# Patient Record
Sex: Male | Born: 1935 | Race: White | Hispanic: No | Marital: Married | Smoking: Former smoker
Health system: Southern US, Community
[De-identification: ages and names within clinical notes are randomized; demographics above are authoritative.]

## PROBLEM LIST (undated history)

## (undated) DIAGNOSIS — M1990 Unspecified osteoarthritis, unspecified site: Secondary | ICD-10-CM

## (undated) DIAGNOSIS — Z87442 Personal history of urinary calculi: Secondary | ICD-10-CM

## (undated) DIAGNOSIS — I10 Essential (primary) hypertension: Secondary | ICD-10-CM

## (undated) DIAGNOSIS — N400 Enlarged prostate without lower urinary tract symptoms: Secondary | ICD-10-CM

## (undated) DIAGNOSIS — M545 Low back pain, unspecified: Secondary | ICD-10-CM

## (undated) DIAGNOSIS — K5790 Diverticulosis of intestine, part unspecified, without perforation or abscess without bleeding: Secondary | ICD-10-CM

## (undated) DIAGNOSIS — I491 Atrial premature depolarization: Secondary | ICD-10-CM

## (undated) DIAGNOSIS — J449 Chronic obstructive pulmonary disease, unspecified: Secondary | ICD-10-CM

## (undated) DIAGNOSIS — E7800 Pure hypercholesterolemia, unspecified: Secondary | ICD-10-CM

## (undated) DIAGNOSIS — D510 Vitamin B12 deficiency anemia due to intrinsic factor deficiency: Secondary | ICD-10-CM

## (undated) DIAGNOSIS — M47812 Spondylosis without myelopathy or radiculopathy, cervical region: Secondary | ICD-10-CM

## (undated) DIAGNOSIS — Z77090 Contact with and (suspected) exposure to asbestos: Secondary | ICD-10-CM

## (undated) DIAGNOSIS — E291 Testicular hypofunction: Secondary | ICD-10-CM

## (undated) DIAGNOSIS — C61 Malignant neoplasm of prostate: Secondary | ICD-10-CM

## (undated) HISTORY — DX: Benign prostatic hyperplasia without lower urinary tract symptoms: N40.0

## (undated) HISTORY — DX: Chronic obstructive pulmonary disease, unspecified: J44.9

## (undated) HISTORY — PX: OTHER SURGICAL HISTORY: SHX169

## (undated) HISTORY — DX: Pure hypercholesterolemia, unspecified: E78.00

## (undated) HISTORY — DX: Essential (primary) hypertension: I10

## (undated) HISTORY — DX: Atrial premature depolarization: I49.1

## (undated) HISTORY — DX: Unspecified osteoarthritis, unspecified site: M19.90

## (undated) HISTORY — DX: Malignant neoplasm of prostate: C61

## (undated) HISTORY — DX: Spondylosis without myelopathy or radiculopathy, cervical region: M47.812

## (undated) HISTORY — DX: Low back pain: M54.5

## (undated) HISTORY — PX: PROSTATE SURGERY: SHX751

## (undated) HISTORY — DX: Low back pain, unspecified: M54.50

## (undated) HISTORY — DX: Testicular hypofunction: E29.1

## (undated) HISTORY — PX: APPENDECTOMY: SHX54

## (undated) HISTORY — DX: Diverticulosis of intestine, part unspecified, without perforation or abscess without bleeding: K57.90

## (undated) HISTORY — PX: LUNG SURGERY: SHX703

## (undated) HISTORY — DX: Vitamin B12 deficiency anemia due to intrinsic factor deficiency: D51.0

## (undated) HISTORY — DX: Contact with and (suspected) exposure to asbestos: Z77.090

---

## 1995-11-29 HISTORY — PX: OTHER SURGICAL HISTORY: SHX169

## 1999-11-29 HISTORY — PX: OTHER SURGICAL HISTORY: SHX169

## 2000-04-13 ENCOUNTER — Ambulatory Visit (HOSPITAL_COMMUNITY): Admission: RE | Admit: 2000-04-13 | Discharge: 2000-04-13 | Payer: Self-pay | Admitting: Pulmonary Disease

## 2000-04-13 ENCOUNTER — Encounter: Payer: Self-pay | Admitting: Pulmonary Disease

## 2000-04-19 ENCOUNTER — Inpatient Hospital Stay (HOSPITAL_COMMUNITY): Admission: RE | Admit: 2000-04-19 | Discharge: 2000-04-20 | Payer: Self-pay | Admitting: Neurosurgery

## 2000-04-19 ENCOUNTER — Encounter: Payer: Self-pay | Admitting: Neurosurgery

## 2002-06-03 ENCOUNTER — Encounter: Payer: Self-pay | Admitting: Orthopedic Surgery

## 2002-06-06 ENCOUNTER — Encounter: Payer: Self-pay | Admitting: Orthopedic Surgery

## 2002-06-06 ENCOUNTER — Observation Stay (HOSPITAL_COMMUNITY): Admission: RE | Admit: 2002-06-06 | Discharge: 2002-06-07 | Payer: Self-pay | Admitting: Orthopedic Surgery

## 2004-04-15 ENCOUNTER — Ambulatory Visit (HOSPITAL_COMMUNITY): Admission: RE | Admit: 2004-04-15 | Discharge: 2004-04-15 | Payer: Self-pay | Admitting: Pulmonary Disease

## 2005-06-06 ENCOUNTER — Ambulatory Visit: Payer: Self-pay | Admitting: Pulmonary Disease

## 2005-06-13 ENCOUNTER — Ambulatory Visit: Payer: Self-pay

## 2005-06-17 ENCOUNTER — Ambulatory Visit: Payer: Self-pay | Admitting: Pulmonary Disease

## 2005-07-08 ENCOUNTER — Ambulatory Visit: Payer: Self-pay | Admitting: Pulmonary Disease

## 2005-07-22 ENCOUNTER — Ambulatory Visit: Payer: Self-pay | Admitting: Pulmonary Disease

## 2005-08-19 ENCOUNTER — Ambulatory Visit: Payer: Self-pay | Admitting: Pulmonary Disease

## 2005-08-26 ENCOUNTER — Encounter: Admission: RE | Admit: 2005-08-26 | Discharge: 2005-08-26 | Payer: Self-pay | Admitting: Pulmonary Disease

## 2005-09-20 ENCOUNTER — Ambulatory Visit: Payer: Self-pay | Admitting: Pulmonary Disease

## 2005-10-19 ENCOUNTER — Ambulatory Visit: Payer: Self-pay | Admitting: Pulmonary Disease

## 2005-11-16 ENCOUNTER — Ambulatory Visit: Payer: Self-pay | Admitting: Pulmonary Disease

## 2005-12-07 ENCOUNTER — Ambulatory Visit: Payer: Self-pay | Admitting: Pulmonary Disease

## 2005-12-07 LAB — CONVERTED CEMR LAB: TSH: 2.27 microintl units/mL

## 2005-12-19 ENCOUNTER — Ambulatory Visit: Payer: Self-pay | Admitting: Pulmonary Disease

## 2006-01-09 ENCOUNTER — Ambulatory Visit: Payer: Self-pay | Admitting: Pulmonary Disease

## 2006-01-09 ENCOUNTER — Ambulatory Visit (HOSPITAL_COMMUNITY): Admission: RE | Admit: 2006-01-09 | Discharge: 2006-01-09 | Payer: Self-pay | Admitting: Pulmonary Disease

## 2006-02-01 ENCOUNTER — Ambulatory Visit: Payer: Self-pay | Admitting: Pulmonary Disease

## 2006-03-01 ENCOUNTER — Ambulatory Visit: Payer: Self-pay | Admitting: Pulmonary Disease

## 2006-03-29 ENCOUNTER — Ambulatory Visit: Payer: Self-pay | Admitting: Pulmonary Disease

## 2006-04-06 ENCOUNTER — Ambulatory Visit: Payer: Self-pay | Admitting: Pulmonary Disease

## 2006-05-01 ENCOUNTER — Ambulatory Visit: Payer: Self-pay | Admitting: Pulmonary Disease

## 2006-05-30 ENCOUNTER — Ambulatory Visit: Payer: Self-pay | Admitting: Pulmonary Disease

## 2006-06-27 ENCOUNTER — Ambulatory Visit: Payer: Self-pay | Admitting: Pulmonary Disease

## 2006-07-25 ENCOUNTER — Ambulatory Visit: Payer: Self-pay | Admitting: Pulmonary Disease

## 2006-08-08 ENCOUNTER — Ambulatory Visit: Payer: Self-pay | Admitting: Pulmonary Disease

## 2006-08-29 ENCOUNTER — Ambulatory Visit: Payer: Self-pay | Admitting: Pulmonary Disease

## 2006-09-28 ENCOUNTER — Ambulatory Visit: Payer: Self-pay | Admitting: Pulmonary Disease

## 2006-10-30 ENCOUNTER — Ambulatory Visit: Payer: Self-pay | Admitting: Pulmonary Disease

## 2006-11-29 ENCOUNTER — Ambulatory Visit: Payer: Self-pay | Admitting: Pulmonary Disease

## 2006-12-29 ENCOUNTER — Ambulatory Visit: Payer: Self-pay | Admitting: Pulmonary Disease

## 2007-01-30 ENCOUNTER — Ambulatory Visit: Payer: Self-pay | Admitting: Pulmonary Disease

## 2007-02-27 ENCOUNTER — Ambulatory Visit: Payer: Self-pay | Admitting: Pulmonary Disease

## 2007-02-27 LAB — CONVERTED CEMR LAB
Alkaline Phosphatase: 78 units/L (ref 39–117)
BUN: 14 mg/dL (ref 6–23)
Basophils Absolute: 0.1 10*3/uL (ref 0.0–0.1)
Basophils Relative: 0.9 % (ref 0.0–1.0)
Bilirubin, Direct: 0.3 mg/dL (ref 0.0–0.3)
CO2: 26 meq/L (ref 19–32)
Calcium: 10 mg/dL (ref 8.4–10.5)
Chloride: 104 meq/L (ref 96–112)
Creatinine, Ser: 0.9 mg/dL (ref 0.4–1.5)
Eosinophils Relative: 3.7 % (ref 0.0–5.0)
Glucose, Bld: 105 mg/dL — ABNORMAL HIGH (ref 70–99)
HCT: 51.7 % (ref 39.0–52.0)
MCHC: 34.1 g/dL (ref 30.0–36.0)
MCV: 85.9 fL (ref 78.0–100.0)
PSA: 2.91 ng/mL
Potassium: 5 meq/L (ref 3.5–5.1)
RDW: 12.4 % (ref 11.5–14.6)
WBC: 10.1 10*3/uL (ref 4.5–10.5)

## 2007-03-29 ENCOUNTER — Ambulatory Visit: Payer: Self-pay | Admitting: Pulmonary Disease

## 2007-04-27 ENCOUNTER — Ambulatory Visit: Payer: Self-pay | Admitting: Pulmonary Disease

## 2007-05-28 ENCOUNTER — Ambulatory Visit: Payer: Self-pay | Admitting: Pulmonary Disease

## 2007-06-27 ENCOUNTER — Ambulatory Visit: Payer: Self-pay | Admitting: Pulmonary Disease

## 2007-07-27 ENCOUNTER — Ambulatory Visit: Payer: Self-pay | Admitting: Pulmonary Disease

## 2007-08-28 ENCOUNTER — Ambulatory Visit: Payer: Self-pay | Admitting: Pulmonary Disease

## 2007-09-13 ENCOUNTER — Encounter: Payer: Self-pay | Admitting: Pulmonary Disease

## 2007-09-13 DIAGNOSIS — D510 Vitamin B12 deficiency anemia due to intrinsic factor deficiency: Secondary | ICD-10-CM

## 2007-09-13 DIAGNOSIS — Z87898 Personal history of other specified conditions: Secondary | ICD-10-CM | POA: Insufficient documentation

## 2007-09-13 DIAGNOSIS — J449 Chronic obstructive pulmonary disease, unspecified: Secondary | ICD-10-CM

## 2007-09-13 DIAGNOSIS — J4489 Other specified chronic obstructive pulmonary disease: Secondary | ICD-10-CM | POA: Insufficient documentation

## 2007-09-13 DIAGNOSIS — M545 Low back pain, unspecified: Secondary | ICD-10-CM | POA: Insufficient documentation

## 2007-09-13 DIAGNOSIS — I491 Atrial premature depolarization: Secondary | ICD-10-CM

## 2007-09-13 DIAGNOSIS — Z77090 Contact with and (suspected) exposure to asbestos: Secondary | ICD-10-CM

## 2007-09-13 DIAGNOSIS — K5730 Diverticulosis of large intestine without perforation or abscess without bleeding: Secondary | ICD-10-CM

## 2007-09-13 DIAGNOSIS — I10 Essential (primary) hypertension: Secondary | ICD-10-CM | POA: Insufficient documentation

## 2007-09-13 DIAGNOSIS — M1990 Unspecified osteoarthritis, unspecified site: Secondary | ICD-10-CM | POA: Insufficient documentation

## 2007-09-27 ENCOUNTER — Ambulatory Visit: Payer: Self-pay | Admitting: Pulmonary Disease

## 2007-10-29 ENCOUNTER — Ambulatory Visit: Payer: Self-pay | Admitting: Pulmonary Disease

## 2007-11-28 ENCOUNTER — Ambulatory Visit: Payer: Self-pay | Admitting: Pulmonary Disease

## 2007-12-28 ENCOUNTER — Ambulatory Visit: Payer: Self-pay | Admitting: Pulmonary Disease

## 2008-01-29 ENCOUNTER — Ambulatory Visit: Payer: Self-pay | Admitting: Pulmonary Disease

## 2008-02-29 ENCOUNTER — Ambulatory Visit: Payer: Self-pay | Admitting: Pulmonary Disease

## 2008-03-28 ENCOUNTER — Ambulatory Visit: Payer: Self-pay | Admitting: Pulmonary Disease

## 2008-04-02 ENCOUNTER — Ambulatory Visit: Payer: Self-pay | Admitting: Pulmonary Disease

## 2008-04-21 DIAGNOSIS — E785 Hyperlipidemia, unspecified: Secondary | ICD-10-CM | POA: Insufficient documentation

## 2008-04-21 DIAGNOSIS — M47812 Spondylosis without myelopathy or radiculopathy, cervical region: Secondary | ICD-10-CM

## 2008-04-28 ENCOUNTER — Ambulatory Visit: Payer: Self-pay | Admitting: Pulmonary Disease

## 2008-05-28 ENCOUNTER — Ambulatory Visit: Payer: Self-pay | Admitting: Pulmonary Disease

## 2008-07-02 ENCOUNTER — Ambulatory Visit: Payer: Self-pay | Admitting: Pulmonary Disease

## 2008-07-31 ENCOUNTER — Ambulatory Visit: Payer: Self-pay | Admitting: Pulmonary Disease

## 2008-08-28 ENCOUNTER — Ambulatory Visit: Payer: Self-pay | Admitting: Pulmonary Disease

## 2008-09-29 ENCOUNTER — Ambulatory Visit: Payer: Self-pay | Admitting: Pulmonary Disease

## 2008-10-29 ENCOUNTER — Ambulatory Visit: Payer: Self-pay | Admitting: Pulmonary Disease

## 2008-12-01 ENCOUNTER — Ambulatory Visit: Payer: Self-pay | Admitting: Pulmonary Disease

## 2009-01-01 ENCOUNTER — Ambulatory Visit: Payer: Self-pay | Admitting: Pulmonary Disease

## 2009-01-29 ENCOUNTER — Ambulatory Visit: Payer: Self-pay | Admitting: Pulmonary Disease

## 2009-03-02 ENCOUNTER — Ambulatory Visit: Payer: Self-pay | Admitting: Pulmonary Disease

## 2009-04-01 ENCOUNTER — Ambulatory Visit: Payer: Self-pay | Admitting: Pulmonary Disease

## 2009-04-30 ENCOUNTER — Ambulatory Visit: Payer: Self-pay | Admitting: Pulmonary Disease

## 2009-04-30 DIAGNOSIS — E291 Testicular hypofunction: Secondary | ICD-10-CM | POA: Insufficient documentation

## 2009-05-04 LAB — CONVERTED CEMR LAB
ALT: 15 units/L (ref 0–53)
AST: 20 units/L (ref 0–37)
Alkaline Phosphatase: 61 units/L (ref 39–117)
BUN: 20 mg/dL (ref 6–23)
Basophils Absolute: 0 10*3/uL (ref 0.0–0.1)
Basophils Relative: 0.2 % (ref 0.0–3.0)
Bilirubin, Direct: 0.3 mg/dL (ref 0.0–0.3)
Chloride: 111 meq/L (ref 96–112)
Eosinophils Absolute: 0.1 10*3/uL (ref 0.0–0.7)
HCT: 48.3 % (ref 39.0–52.0)
Lymphs Abs: 2 10*3/uL (ref 0.7–4.0)
MCHC: 34.5 g/dL (ref 30.0–36.0)
MCV: 86.9 fL (ref 78.0–100.0)
Monocytes Absolute: 0.6 10*3/uL (ref 0.1–1.0)
Monocytes Relative: 6.8 % (ref 3.0–12.0)
Neutrophils Relative %: 67 % (ref 43.0–77.0)
PSA: 4.31 ng/mL — ABNORMAL HIGH (ref 0.10–4.00)
Potassium: 5 meq/L (ref 3.5–5.1)
RBC: 5.56 M/uL (ref 4.22–5.81)
RDW: 13 % (ref 11.5–14.6)
Sodium: 143 meq/L (ref 135–145)
Testosterone: 255.39 ng/dL — ABNORMAL LOW (ref 350.00–890.00)
Total Bilirubin: 1.7 mg/dL — ABNORMAL HIGH (ref 0.3–1.2)
Total CHOL/HDL Ratio: 5
VLDL: 16.4 mg/dL (ref 0.0–40.0)
WBC: 8.2 10*3/uL (ref 4.5–10.5)

## 2009-05-26 ENCOUNTER — Encounter: Payer: Self-pay | Admitting: Pulmonary Disease

## 2009-05-29 ENCOUNTER — Ambulatory Visit: Payer: Self-pay | Admitting: Pulmonary Disease

## 2009-06-15 LAB — CONVERTED CEMR LAB: Vit D, 25-Hydroxy: 33 ng/mL (ref 30–89)

## 2009-06-29 ENCOUNTER — Ambulatory Visit: Payer: Self-pay | Admitting: Pulmonary Disease

## 2009-07-30 ENCOUNTER — Ambulatory Visit: Payer: Self-pay | Admitting: Pulmonary Disease

## 2009-08-28 ENCOUNTER — Ambulatory Visit: Payer: Self-pay | Admitting: Pulmonary Disease

## 2009-09-28 ENCOUNTER — Ambulatory Visit: Payer: Self-pay | Admitting: Pulmonary Disease

## 2009-10-28 ENCOUNTER — Ambulatory Visit: Payer: Self-pay | Admitting: Pulmonary Disease

## 2009-11-26 ENCOUNTER — Encounter: Payer: Self-pay | Admitting: Pulmonary Disease

## 2009-11-30 ENCOUNTER — Ambulatory Visit: Payer: Self-pay | Admitting: Pulmonary Disease

## 2009-12-22 ENCOUNTER — Encounter: Payer: Self-pay | Admitting: Pulmonary Disease

## 2009-12-30 ENCOUNTER — Ambulatory Visit: Payer: Self-pay | Admitting: Pulmonary Disease

## 2010-01-04 ENCOUNTER — Ambulatory Visit (HOSPITAL_COMMUNITY): Admission: RE | Admit: 2010-01-04 | Discharge: 2010-01-04 | Payer: Self-pay | Admitting: Urology

## 2010-01-21 ENCOUNTER — Encounter: Payer: Self-pay | Admitting: Pulmonary Disease

## 2010-01-22 ENCOUNTER — Ambulatory Visit: Admission: RE | Admit: 2010-01-22 | Discharge: 2010-04-09 | Payer: Self-pay | Admitting: Radiation Oncology

## 2010-01-29 ENCOUNTER — Ambulatory Visit: Payer: Self-pay | Admitting: Pulmonary Disease

## 2010-03-01 ENCOUNTER — Ambulatory Visit: Payer: Self-pay | Admitting: Pulmonary Disease

## 2010-03-09 ENCOUNTER — Encounter: Payer: Self-pay | Admitting: Pulmonary Disease

## 2010-03-18 ENCOUNTER — Encounter: Payer: Self-pay | Admitting: Pulmonary Disease

## 2010-03-31 ENCOUNTER — Ambulatory Visit: Payer: Self-pay | Admitting: Pulmonary Disease

## 2010-04-28 HISTORY — PX: OTHER SURGICAL HISTORY: SHX169

## 2010-04-30 ENCOUNTER — Ambulatory Visit: Payer: Self-pay | Admitting: Pulmonary Disease

## 2010-05-12 ENCOUNTER — Encounter: Admission: RE | Admit: 2010-05-12 | Discharge: 2010-05-12 | Payer: Self-pay | Admitting: Urology

## 2010-05-21 ENCOUNTER — Ambulatory Visit (HOSPITAL_BASED_OUTPATIENT_CLINIC_OR_DEPARTMENT_OTHER): Admission: RE | Admit: 2010-05-21 | Discharge: 2010-05-21 | Payer: Self-pay | Admitting: Urology

## 2010-05-21 ENCOUNTER — Encounter: Payer: Self-pay | Admitting: Pulmonary Disease

## 2010-05-24 ENCOUNTER — Ambulatory Visit: Payer: Self-pay | Admitting: Pulmonary Disease

## 2010-06-01 ENCOUNTER — Ambulatory Visit: Payer: Self-pay | Admitting: Pulmonary Disease

## 2010-06-10 ENCOUNTER — Ambulatory Visit: Admission: RE | Admit: 2010-06-10 | Discharge: 2010-08-15 | Payer: Self-pay | Admitting: Radiation Oncology

## 2010-06-29 ENCOUNTER — Ambulatory Visit: Payer: Self-pay | Admitting: Pulmonary Disease

## 2010-07-30 ENCOUNTER — Ambulatory Visit: Payer: Self-pay | Admitting: Pulmonary Disease

## 2010-08-10 ENCOUNTER — Encounter: Payer: Self-pay | Admitting: Pulmonary Disease

## 2010-08-26 ENCOUNTER — Encounter: Payer: Self-pay | Admitting: Pulmonary Disease

## 2010-08-27 ENCOUNTER — Ambulatory Visit: Payer: Self-pay | Admitting: Pulmonary Disease

## 2010-09-24 ENCOUNTER — Ambulatory Visit: Payer: Self-pay | Admitting: Pulmonary Disease

## 2010-10-26 ENCOUNTER — Ambulatory Visit: Payer: Self-pay | Admitting: Pulmonary Disease

## 2010-11-26 ENCOUNTER — Ambulatory Visit: Payer: Self-pay | Admitting: Pulmonary Disease

## 2010-12-06 ENCOUNTER — Other Ambulatory Visit: Payer: Self-pay | Admitting: Pulmonary Disease

## 2010-12-06 ENCOUNTER — Ambulatory Visit
Admission: RE | Admit: 2010-12-06 | Discharge: 2010-12-06 | Payer: Self-pay | Source: Home / Self Care | Attending: Pulmonary Disease | Admitting: Pulmonary Disease

## 2010-12-06 LAB — LIPID PANEL
Cholesterol: 159 mg/dL (ref 0–200)
HDL: 32.5 mg/dL — ABNORMAL LOW (ref >39.00)
LDL Cholesterol: 109 mg/dL — ABNORMAL HIGH (ref 0–99)
Total CHOL/HDL Ratio: 5
Triglycerides: 86 mg/dL (ref 0.0–149.0)
VLDL: 17.2 mg/dL (ref 0.0–40.0)

## 2010-12-06 LAB — CBC WITH DIFFERENTIAL/PLATELET
Basophils Absolute: 0 10*3/uL (ref 0.0–0.1)
Basophils Relative: 0.4 % (ref 0.0–3.0)
Eosinophils Absolute: 0.3 10*3/uL (ref 0.0–0.7)
Eosinophils Relative: 4.1 % (ref 0.0–5.0)
HCT: 45.8 % (ref 39.0–52.0)
Hemoglobin: 15.9 g/dL (ref 13.0–17.0)
Lymphocytes Relative: 21.1 % (ref 12.0–46.0)
Lymphs Abs: 1.5 10*3/uL (ref 0.7–4.0)
MCHC: 34.7 g/dL (ref 30.0–36.0)
MCV: 86.5 fl (ref 78.0–100.0)
Monocytes Absolute: 0.6 10*3/uL (ref 0.1–1.0)
Monocytes Relative: 8.7 % (ref 3.0–12.0)
Neutro Abs: 4.5 10*3/uL (ref 1.4–7.7)
Neutrophils Relative %: 65.7 % (ref 43.0–77.0)
Platelets: 148 10*3/uL — ABNORMAL LOW (ref 150.0–400.0)
RBC: 5.3 Mil/uL (ref 4.22–5.81)
RDW: 13.4 % (ref 11.5–14.6)
WBC: 6.9 10*3/uL (ref 4.5–10.5)

## 2010-12-06 LAB — BASIC METABOLIC PANEL
BUN: 17 mg/dL (ref 6–23)
CO2: 27 mEq/L (ref 19–32)
Calcium: 10 mg/dL (ref 8.4–10.5)
Chloride: 109 mEq/L (ref 96–112)
Creatinine, Ser: 0.9 mg/dL (ref 0.4–1.5)
GFR: 82.81 mL/min (ref >60.00)
Glucose, Bld: 95 mg/dL (ref 70–99)
Potassium: 5.6 mEq/L — ABNORMAL HIGH (ref 3.5–5.1)
Sodium: 141 mEq/L (ref 135–145)

## 2010-12-06 LAB — HEPATIC FUNCTION PANEL
ALT: 15 U/L (ref 0–53)
AST: 19 U/L (ref 0–37)
Albumin: 3.8 g/dL (ref 3.5–5.2)
Alkaline Phosphatase: 69 U/L (ref 39–117)
Bilirubin, Direct: 0.3 mg/dL (ref 0.0–0.3)
Total Bilirubin: 1.7 mg/dL — ABNORMAL HIGH (ref 0.3–1.2)
Total Protein: 6.8 g/dL (ref 6.0–8.3)

## 2010-12-06 LAB — VITAMIN B12: Vitamin B-12: 497 pg/mL (ref 211–911)

## 2010-12-06 LAB — TSH: TSH: 2.07 u[IU]/mL (ref 0.35–5.50)

## 2010-12-24 ENCOUNTER — Ambulatory Visit
Admission: RE | Admit: 2010-12-24 | Discharge: 2010-12-24 | Payer: Self-pay | Source: Home / Self Care | Attending: Pulmonary Disease | Admitting: Pulmonary Disease

## 2010-12-26 LAB — CONVERTED CEMR LAB
ALT: 19 units/L (ref 0–53)
AST: 19 units/L (ref 0–37)
Bilirubin, Direct: 0.2 mg/dL (ref 0.0–0.3)
Calcium: 10.1 mg/dL (ref 8.4–10.5)
Chloride: 109 meq/L (ref 96–112)
Cholesterol: 155 mg/dL (ref 0–200)
Creatinine, Ser: 1 mg/dL (ref 0.4–1.5)
Eosinophils Relative: 2 % (ref 0.0–5.0)
Glucose, Bld: 122 mg/dL — ABNORMAL HIGH (ref 70–99)
HDL: 28.4 mg/dL — ABNORMAL LOW (ref >39.0)
Lymphocytes Relative: 24.4 % (ref 12.0–46.0)
MCHC: 34.7 g/dL (ref 30.0–36.0)
Monocytes Relative: 5.8 % (ref 3.0–12.0)
Neutrophils Relative %: 66.8 % (ref 43.0–77.0)
PSA: 3.58 ng/mL (ref 0.10–4.00)
Potassium: 5.1 meq/L (ref 3.5–5.1)
RDW: 13.1 % (ref 11.5–14.6)
Sodium: 142 meq/L (ref 135–145)
Total Protein: 7.3 g/dL (ref 6.0–8.3)
Triglycerides: 73 mg/dL (ref 0–149)
VLDL: 15 mg/dL (ref 0–40)

## 2010-12-30 NOTE — Assessment & Plan Note (Signed)
Summary: Joseph Carter ///kp   Nurse Visit   Allergies: 1)  ! Lisinopril (Lisinopril)  Medication Administration  Injection # 1:    Medication: Vit B12 1000 mcg    Diagnosis: UNSPECIFIED ANEMIA (ICD-285.9)    Route: IM    Site: R deltoid    Exp Date: 08/28/2012    Lot #: [id number]    Mfr: [employer]    Comments: Vitamin b-12 1000 micrograms, in R deltoid. Pt tolerated injection ok.    Given by: Drucie Opitz, CMA (AAMA)  Orders Added: 1)  Vit B12 1000 mcg [J3420] 2)  Admin of Therapeutic Inj  intramuscular or subcutaneous [16109]

## 2010-12-30 NOTE — Letter (Signed)
Summary: [facility]  [facility]   Imported By: Lennie Odor 09/16/2010 15:17:53  _____________________________________________________________________  External Attachment:    Type:   Image     Comment:   External Document

## 2010-12-30 NOTE — Assessment & Plan Note (Signed)
Summary: b-12/apc   Nurse Visit   Allergies: 1)  ! Lisinopril (Lisinopril)  Medication Administration  Injection # 1:    Medication: Vit B12 1000 mcg    Diagnosis: 285.9    Route: SQ    Site: L deltoid    Exp Date: 12/2011    Lot #: [id number]    Mfr: [employer]    Comments: 1000 MCG CHARGED [id number]    Given by: Drucie Opitz IN [facility]ERGY [facility]    Medication Administration  Injection # 1:    Medication: Vit B12 1000 mcg    Diagnosis: 285.9    Route: SQ    Site: L deltoid    Exp Date: 12/2011    Lot #: [id number]    Mfr: [employer]    Comments: 1000 MCG CHARGED [id number] AND [id number]    Given by: Drucie Opitz IN [facility]

## 2010-12-30 NOTE — Assessment & Plan Note (Signed)
Summary: xolair///kp   Nurse Visit   Allergies: 1)  ! Lisinopril (Lisinopril)  Medication Administration  Injection # 1:    Medication: Vit B12 1000 mcg    Diagnosis: UNSPECIFIED ANEMIA (ICD-285.9)    Route: IM    Site: L deltoid    Exp Date: 12/30/2011    Lot #: [id number]    Mfr: [employer]    Comments: vitamin b-12 injection 1000 micrograms in l deltoid pt has no problems and didn't wait    Given by: Clarise Cruz  Orders Added: 1)  Vit B12 1000 mcg [J3420] 2)  Admin of Therapeutic Inj  intramuscular or subcutaneous [16109]

## 2010-12-30 NOTE — Assessment & Plan Note (Signed)
Summary: B-12/MHH   Nurse Visit   Allergies: 1)  ! Lisinopril (Lisinopril)  Medication Administration  Injection # 1:    Medication: Vit B12 1000 mcg    Diagnosis: UNSPECIFIED ANEMIA (ICD-285.9)    Route: IM    Site: L deltoid    Exp Date: 03/2012    Lot #: [id number]    Mfr: [employer]    Comments: CHARGED J3420 AND 45409    Patient tolerated injection without complications    Given by: Drucie Opitz IN [facility]   Medication Administration  Injection # 1:    Medication: Vit B12 1000 mcg    Diagnosis: UNSPECIFIED ANEMIA (ICD-285.9)    Route: IM    Site: L deltoid    Exp Date: 03/2012    Lot #: [id number]    Mfr: [employer]    Comments: CHARGED J3420 AND 82956    Patient tolerated injection without complications    Given by: SUSANNE FORD IN [facility]ERGY [facility]

## 2010-12-30 NOTE — Assessment & Plan Note (Signed)
Summary: NP follow up - med refills   CC:  ov to have diovan refilled.  has been following up with cancer center for prostate cancer.  History of Present Illness: 77 y/o male with known hx of   6/10--WM here for a follow up visit... he has multiple medical problems as noted below...  he reports having a good year- BP controlled on his home checks, getting B12 shots monthly, feeling well w/o new complanits or concerns... he exercises w/ yard work etc & no problems reported.  May 24, 2010--Returns for follow up  and med refills. Doing well , despite all the treatments for prostate cancer.  He was Dx w/ Prostate cancer s/p seed implants and radiation f/by Dr. Earlene Plater over last 6 months . Labs done last week at urololgy w/ nml cbc and scr at 1.06. Denies chest pain, dyspnea, orthopnea, hemoptysis, fever, n/v/d, edema, headache.    Preventive Screening-Counseling & Management  Alcohol-Tobacco     Smoking Status: quit  Medications Prior to Update: 1)  Adult Aspirin Ec Low Strength 81 Mg  Tbec (Aspirin) .[name].. 1 By Mouth Once Daily 2)  Norvasc 5 Mg  Tabs (Amlodipine Besylate) .[name].. 1 By Mouth Once Daily 3)  Diovan 160 Mg  Tabs (Valsartan) .[name].. 1 By Mouth Once Daily 4)  Centrum Silver   Tabs (Multiple Vitamins-Minerals) .... Once Daily 5)  Cobal-1000 1000 Mcg/ml Inj Soln (Cyanocobalamin) .[name].. 1 Injection Every Month  Current Medications (verified): 1)  Adult Aspirin Ec Low Strength 81 Mg  Tbec (Aspirin) .[name].. 1 By Mouth Once Daily 2)  Norvasc 5 Mg  Tabs (Amlodipine Besylate) .[name].. 1 By Mouth Once Daily 3)  Diovan 160 Mg  Tabs (Valsartan) .[name].. 1 By Mouth Once Daily 4)  Centrum Silver   Tabs (Multiple Vitamins-Minerals) .... Once Daily 5)  Cobal-1000 1000 Mcg/ml Inj Soln (Cyanocobalamin) .[name].. 1 Injection Every Month  Allergies (verified): 1)  ! Lisinopril (Lisinopril)  Past History:  Family History: Last updated: 2009-05-18 Father died age 51 w/ metastatic prostate ca Mother died age 63 w/  AAA rupture 2 Siblings: both brothers- one w/ kidney disease  Social History: Last updated: 05/18/2009 Married, wife= Lillian, 54 yrs. 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  Risk Factors: Smoking Status: quit (05/24/2010)  Past Medical History: COPD (ICD-496) - ex-smoker (quit ~1980) w/ hx COPD.. .. s/p pneumovax in 2000 at age 23, and l repeat 05-18-09.  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   HYPERTENSION (ICD-401.9) - on ASA 81mg /d, NORVASC 5mg /d and DIOVAN 160mg /d..   ~  + FamHx CAD- Bro w/ CABG...  ~  2DEcho 9/98 showed mild LVH, norm LVF, mild AI...  Hx of PAC (ICD-427.61) - neg cardiac eval 1998... he takes ASA 81mg /d...  HYPERCHOLESTEROLEMIA, BORDERLINE (ICD-272.4)  ~  FLP 9/07 showed TChol 161, TG 54, HDL 33, LDL 117... on diet + exercise Rx...  ~  FLP 5/09 showed TChol 155, TG 73, HDL 28, LDL 112  ~  FLP 6/10 showed TChol 160, TG 82, HDL 34, LDL 110  DIVERTICULAR DISEASE (ICD-562.10) - colonoscopy 4/03 by Dorris Singh showed divertics only... f/u rec 10 yrs.  BENIGN PROSTATIC HYPERTROPHY, HX OF (ICD-V13.8),  ELEVATED PROSTATE SPECIFIC ANTIGEN (ICD-790.93), & HYPOGONADISM (ICD-257.2)- prev hx of intol to Doxazosin... therefore changed to [facility] 0.4mg /d but states no benefit to his decr stream so he stopped  it.. ** labs 6/10 w/ sl incr PSA & low testosterone **  ~  labs 4/08 showed PSA= 2.91  ~  labs 5/09 showed PSA= 3.58  ~  labs 6/10 showed PSA= 4.31 (note: Testosterone level= 255)... refer to Urology  PROSTATE CANCER S/P SEEDS AND RADIATION--2011, by Dr. Earlene Plater.      Review of Systems      See HPI  Vital Signs:  Patient profile:   77 year old male Height:      76 inches Weight:      222 pounds BMI:     27.12 O2 Sat:      96 % on Room air Temp:     97.5 degrees F oral Pulse rate:   63 / minute BP  sitting:   126 / 74  (left arm) Cuff size:   regular  Vitals Entered By: Boone Master CNA/MA (May 24, 2010 3:33 PM)  O2 Flow:  Room air CC: ov to have diovan refilled.  has been following up with cancer center for prostate cancer Is Patient Diabetic? No Comments Medications reviewed with patient Daytime contact number verified with patient. Boone Master CNA/MA  May 24, 2010 3:33 PM    Physical Exam  Additional Exam:  WD, WN, 77 y/o WM in NAD... GENERAL:  Alert & oriented; pleasant & cooperative... HEENT:  [place]/AT, , EACs-clear, TMs-wnl, NOSE-clear, THROAT-clear & wnl. NECK:  Supple w/ fairROM; no JVD; normal carotid impulses w/o bruits; no thyromegaly or nodules palpated; no lymphadenopathy. CHEST:  Clear to P & A; without wheezes/ rales/ or rhonchi. HEART:  Regular Rhythm; without murmurs/ rubs/ or gallops. ABDOMEN:  Soft & nontender; normal bowel sounds; no organomegaly or masses detected. EXT: without deformities, mild arthritic changes; no varicose veins/ venous insuffic/ or edema. NEURO:  CN's intact;  no focal neuro deficits... DERM:  No lesions noted; no rash etc...     Impression & Recommendations:  Problem # 1:  HYPERTENSION (ICD-401.9)  controlled on rx  His updated medication list for this problem includes:    Norvasc 5 Mg Tabs (Amlodipine besylate) .[name]... 1 by mouth once daily    Diovan 160 Mg Tabs (Valsartan) .[name]... 1 by mouth once daily  BP today: 126/74 Prior BP: 140/78 (04/30/2009)  Orders: Est. Patient Level III (16109)  Problem # 2:  HYPERCHOLESTEROLEMIA, BORDERLINE (ICD-272.4) cont w/ diet and exercise  Labs Reviewed: SGOT: 20 (04/30/2009)   SGPT: 15 (04/30/2009)   HDL:33.70 (04/30/2009), 28.4 (04/02/2008)  LDL:110 (04/30/2009), 112 (04/02/2008)  Chol:160 (04/30/2009), 155 (04/02/2008)  Trig:82.0 (04/30/2009), 73 (04/02/2008)  Problem # 3:  BENIGN PROSTATIC HYPERTROPHY, HX OF (ICD-V13.8)  dx w/ prostate cancer last year s/p seed implants and  radiation.  cont follow up w/ Dr. Earlene Plater   Orders: Est. Patient Level III (60454)  Complete Medication List: 1)  Adult Aspirin Ec Low Strength 81 Mg Tbec (Aspirin) .[name].. 1 by mouth once daily 2)  Norvasc 5 Mg Tabs (Amlodipine besylate) .[name].. 1 by mouth once daily 3)  Diovan 160 Mg Tabs (Valsartan) .[name].. 1 by mouth once daily 4)  Centrum Silver Tabs (Multiple vitamins-minerals) .... Once daily 5)  Cobal-1000 1000 Mcg/ml Inj Soln (Cyanocobalamin) .[name].. 1 injection every month  Patient Instructions: 1)  Continue on same meds  2)  follow up Dr. Kriste Basque in 6 months  Prescriptions: DIOVAN 160 MG  TABS (VALSARTAN) 1 by mouth once daily  #30 Tablet x 5   Entered and Authorized by:   Rubye Oaks NP   Signed by:  Rubye Oaks NP on 05/24/2010   Method used:   Electronically to        [facility] (retail)       [address]       [place], [place]  [address]       Ph: [phone]       Fax: [phone]   RxID:   [id number] NORVASC 5 MG  TABS (AMLODIPINE BESYLATE) 1 by mouth once daily  #30 Tablet x 5   Entered and Authorized by:   Rubye Oaks NP   Signed by:   Rubye Oaks NP on 05/24/2010   Method used:   Electronically to        [facility] (retail)       [address]       [place], [place]  [address]       Ph: [phone]       Fax: [phone]   RxID:   [id number]

## 2010-12-30 NOTE — Assessment & Plan Note (Signed)
Summary: b-12/apc   Nurse Visit   Allergies: 1)  ! Lisinopril (Lisinopril)  Medication Administration  Injection # 1:    Medication: Vit B12 1000 mcg    Diagnosis: UNSPECIFIED ANEMIA (ICD-285.9)    Route: IM    Site: L deltoid    Exp Date: 06/2010    Lot #: [id number]    Mfr: [employer]    Comments: Injection given by Drucie Opitz, CMA in allergy lab.     Patient tolerated injection without complications  Orders Added: 1)  Admin of Therapeutic Inj  intramuscular or subcutaneous [96372] 2)  Vit B12 1000 mcg [J3420]

## 2010-12-30 NOTE — Consult Note (Signed)
Summary: [facility]  [facility]   Imported By: Lennie Odor 03/23/2010 11:55:25  _____________________________________________________________________  External Attachment:    Type:   Image     Comment:   External Document

## 2010-12-30 NOTE — Assessment & Plan Note (Signed)
Summary: 6 month return/mhh   CC:  19 month ROV & review of mult medical problems....  History of Present Illness: 77 y/o WM here for a follow up visit... he has multiple medical problems as noted below...     ~  Jun10:  he reports having a good year- BP controlled on his home checks, getting B12 shots monthly, feeling well w/o new complanits or concerns... he exercises w/ yard work etc & no problems reported.   ~  December 06, 2010:  after Osric's last visit his PSA ret at 4.31 & he was referred to Urology & saw [facility] w/ the eventual Dx of Prostate Cancer (PSA up to 7.18 at that time) & Rx w/ brachytherapy & 3D radiation planning from DrManning... he last saw DrDavis for f/u 9/11 & he plans Q84mo evaluations- on FLOMAX 0.4mg /d & pt wants to stop this (asked to check w/ Urology)...    He has hx Asbestos exposure & prev VATS surg for cyst 1997> CXR is unchanged... BP controlled on meds... Chol looks good on diet alone...  his CC is his arthritis & he uses OTC meds- offer Rx but he doesn't want more meds if poss...  he remains on B12 shots w/ norm B12 level- notes energy "fair" & advised oral MVI etc... he refuses Flu vaccine.    Current Problem List:  COPD (ICD-496) - ex-smoker (quit ~1980) w/ hx COPD... no active symptoms... doing satis, no meds, no recent exac- denies SOB, CP, edema, etc- min AM cough/ no phlegm/ no blood etc...  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  ~  CXR 1/12 showed s/p surg on right, chr changes, NAD...  HYPERTENSION (ICD-401.9) - on ASA 81mg /d, NORVASC 5mg /d and DIOVAN 160mg /d... BP= 144/82 today, but all 130's/ 80's at home... denies HA, fatigue, visual changes, CP, palipit, dizziness, syncope, dyspnea, edema, etc...  ~  + FamHx CAD- Bro w/ CABG...  ~  2DEcho 9/98 showed mild LVH, norm LVF, mild AI...  Hx of PAC (ICD-427.61) - neg cardiac eval 1998... he takes  ASA 81mg /d...  HYPERCHOLESTEROLEMIA, BORDERLINE (ICD-272.4)  ~  FLP 9/07 showed TChol 161, TG 54, HDL 33, LDL 117... on diet + exercise Rx...  ~  FLP 5/09 showed TChol 155, TG 73, HDL 28, LDL 112  ~  FLP 6/10 showed TChol 160, TG 82, HDL 34, LDL 110  ~  FLP 1/12 showed TChol 159, TG 86, HDL 33, LDL 109  DIVERTICULAR DISEASE (ICD-562.10) - colonoscopy 4/03 by Dorris Singh showed divertics only... f/u rec 10 yrs.  BENIGN PROSTATIC HYPERTROPHY, HX OF (ICD-V13.8) ADENOCARCINOMA, PROSTATE (ICD-185) HYPOGONADISM (ICD-257.2) - prev hx of intol to Doxazosin... therefore changed to [facility] 0.4mg /d but states no benefit to his decr stream so he stopped it... only mild LTOS & denies incontinence, freq, nocturia...  ** labs 6/10 w/ sl incr PSA & low testosterone **  ~  labs 4/08 showed PSA= 2.91  ~  labs 5/09 showed PSA= 3.58  ~  labs 6/10 showed PSA= 4.31 (note: Testosterone level= 255)... refer to Urology, DrDavis ==>   & eventual Dx Prostate Cancer & he decided on Brachytherapy per DrManning  (done 6/11)...  ~  pt tells me he continues to follow up w/ DrDavis Q47mo...  DEGENERATIVE JOINT DISEASE (ICD-715.90)  BACK PAIN, LUMBAR (ICD-724.2) - s/p L4-5 laminectomy and microdiscectomy 5/01 by [name]...  CERVICAL SPONDYLOSIS WITHOUT MYELOPATHY (ICD-721.0) -  he also has known CSpine spondylosis and foramenal stenosis- followed by [name]...  ANEMIA, PERNICIOUS (ICD-281.0) - on B 12 shots monthly...  ~  labs 5/09 showed B12= 602  ~  labs 6/10 showed B12= >1500  ~  labs 1/12 showed Hg= 15.9, B12= 497   Preventive Screening-Counseling & Management  Alcohol-Tobacco     Smoking Status: quit     Year Quit: 1971  Allergies: 1)  ! Lisinopril (Lisinopril)  Comments:  Nurse/Medical Assistant: The patient's medications and allergies were reviewed with the patient and were updated in the Medication and Allergy Lists.  Past History:  Past Medical History: COPD (ICD-496) HX, PERSONAL, EXPOSURE TO  ASBESTOS (ICD-V15.84) HYPERTENSION (ICD-401.9) Hx of PAC (ICD-427.61) HYPERCHOLESTEROLEMIA, BORDERLINE (ICD-272.4) DIVERTICULAR DISEASE (ICD-562.10) BENIGN PROSTATIC HYPERTROPHY, HX OF (ICD-V13.8) ADENOCARCINOMA, PROSTATE (ICD-185) HYPOGONADISM (ICD-257.2) DEGENERATIVE JOINT DISEASE (ICD-715.90) BACK PAIN, LUMBAR (ICD-724.2) CERVICAL SPONDYLOSIS WITHOUT MYELOPATHY (ICD-721.0) ANEMIA, PERNICIOUS (ICD-281.0)  Past Surgical History: S/P appendectomy S/P surgery for removal of intercostal cyst in 1997 S/P lumbar laminectomy L4-5 in 2001by [name] S/P CTS surg on right by DrGramig S/P prostate bx, then brachytherapy 6/11 by [email or web address]  Family History: Reviewed history from 04/30/2009 and no changes required. Father died age 53 w/ metastatic prostate ca Mother died age 50 w/ AAA rupture 2 Siblings: both brothers- one w/ kidney disease  Social History: Reviewed history from 04/30/2009 and no changes required. Married, wife= Parachute, 54 yrs. 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  Review of Systems       The patient complains of malaise, dyspnea on exertion, urinary frequency, nocturia, joint pain, stiffness, and arthritis.  The patient denies fever, chills, sweats, anorexia, fatigue, weakness, weight loss, sleep disorder, blurring, diplopia, eye irritation, eye discharge, vision loss, eye pain, photophobia, earache, ear discharge, tinnitus, decreased hearing, nasal congestion, nosebleeds, sore throat, hoarseness, chest pain, palpitations, syncope, orthopnea, PND, peripheral edema, cough, dyspnea at rest, excessive sputum, hemoptysis, wheezing, pleurisy, nausea, vomiting, diarrhea, constipation, change in bowel habits, abdominal pain, melena, hematochezia, jaundice, gas/bloating, indigestion/heartburn, dysphagia, odynophagia, dysuria, hematuria, urinary hesitancy, incontinence, back pain, joint swelling, muscle cramps, muscle  weakness, restless legs, leg pain at night, leg pain with exertion, rash, itching, dryness, suspicious lesions, paralysis, paresthesias, seizures, tremors, vertigo, transient blindness, frequent falls, frequent headaches, difficulty walking, depression, anxiety, memory loss, confusion, cold intolerance, heat intolerance, polydipsia, polyphagia, polyuria, unusual weight change, abnormal bruising, bleeding, enlarged lymph nodes, urticaria, allergic rash, hay fever, and recurrent infections.    Vital Signs:  Patient profile:   77 year old male Height:      76 inches Weight:      226.50 pounds BMI:     27.67 O2 Sat:      97 % on Room air Temp:     97.1 degrees F oral Pulse rate:   66 / minute BP sitting:   146 / 82  (right arm) Cuff size:   regular  Vitals Entered By: Randell Loop CMA (December 06, 2010 10:24 AM)  O2 Sat at Rest %:  97 O2 Flow:  Room air CC: 19 month ROV & review of mult medical problems... Is Patient Diabetic? No Pain Assessment Patient in pain? yes      Onset of pain  arthritis pain Comments meds updated today with pt   Physical Exam  Additional Exam:  WD, WN, 77 y/o WM in NAD... GENERAL:  Alert & oriented; pleasant & cooperative... HEENT:  [place]/AT, , EACs-clear, TMs-wnl, NOSE-clear, THROAT-clear &  wnl. NECK:  Supple w/ fairROM; no JVD; normal carotid impulses w/o bruits; no thyromegaly or nodules palpated; no lymphadenopathy. CHEST:  Clear to P & A; without wheezes/ rales/ or rhonchi heard... HEART:  Regular Rhythm; without murmurs/ rubs/ or gallops detected... ABDOMEN:  Soft & nontender; normal bowel sounds; no organomegaly or masses palpated... EXT: without deformities, mild arthritic changes; no varicose veins/ venous insuffic/ or edema. NEURO:  CN's intact;  no focal neuro deficits... DERM:  No lesions noted; no rash etc...    CXR  Procedure date:  12/06/2010  Findings:      CHEST - 2 VIEW Comparison: 05/12/2010   Findings: Prior thoracotomy/rib  resection on the right with pleural thickening which is unchanged.  No significant pleural fluid is present.  No mass lesion or infiltrate.  Left lung is clear.   Hyperinflation with chronic lung disease.   IMPRESSION: COPD with postsurgical changes on the right.  No change from prior studies.   Read By:  Camelia Phenes,  M.D.   MISC. Report  Procedure date:  12/06/2010  Findings:      BMP (METABOL)   Sodium                    141 mEq/L                   135-145   Potassium            [H]  5.6 mEq/L                   3.5-5.1   Chloride                  109 mEq/L                   96-112   Carbon Dioxide            27 mEq/L                    19-32   Glucose                   95 mg/dL                    16-10   BUN                       17 mg/dL                    9-60   Creatinine                0.9 mg/dL                   4.5-4.0   Calcium                   10.0 mg/dL                  9.8-11.9   GFR                       82.81 mL/min                >60.00  Hepatic/Liver Function Panel (HEPATIC)   Total Bilirubin      [H]  1.7 mg/dL  0.3-1.2   Direct Bilirubin          0.3 mg/dL                   1.6-1.0   Alkaline Phosphatase      69 U/L                      39-117   AST                       19 U/L                      0-37   ALT                       15 U/L                      0-53   Total Protein             6.8 g/dL                    9.6-0.4   Albumin                   3.8 g/dL                    5.4-0.9  CBC Platelet w/Diff (CBCD)   White Cell Count          6.9 K/uL                    4.5-10.5   Red Cell Count            5.30 Mil/uL                 4.22-5.81   Hemoglobin                15.9 g/dL                   81.1-91.4   Hematocrit                45.8 %                      39.0-52.0   MCV                       86.5 fl                     78.0-100.0   Platelet Count       [L]  148.0 K/uL                  150.0-400.0   Neutrophil %               65.7 %                      43.0-77.0   Lymphocyte %              21.1 %                      12.0-46.0   Monocyte %                8.7 %  3.0-12.0   Eosinophils%              4.1 %                       0.0-5.0   Basophils %               0.4 %                       0.0-3.0  Comments:      Lipid Panel (LIPID)   Cholesterol               159 mg/dL                   1-610   Triglycerides             86.0 mg/dL                  9.6-045.4   HDL                  [L]  09.81 mg/dL                 >19.14   LDL Cholesterol      [H]  782 mg/dL                   9-56  TSH (TSH)   FastTSH                   2.07 uIU/mL                 0.35-5.50  B12 Serum - Total ONLY (B12)   Vitamin B12               497 pg/mL                   211-911   Impression & Recommendations:  Problem # 1:  ADENOCARCINOMA, PROSTATE (ICD-185) Followed by DrDavis & DrManning...  Problem # 2:  COPD (ICD-496) Stable>  CXR is unchanged... Hx surg for intersostal cyst, and hx asbestos exposure in past... Orders: T-2 View CXR (71020TC)  Problem # 3:  HYPERTENSION (ICD-401.9) Controlled>  same meds. His updated medication list for this problem includes:    Norvasc 5 Mg Tabs (Amlodipine besylate) .[name]... 1 by mouth once daily    Diovan 160 Mg Tabs (Valsartan) .[name]... 1 by mouth once daily  Orders: TLB-BMP (Basic Metabolic Panel-BMET) (80048-METABOL) TLB-Hepatic/Liver Function Pnl (80076-HEPATIC) TLB-CBC Platelet - w/Differential (85025-CBCD) TLB-Lipid Panel (80061-LIPID) TLB-TSH (Thyroid Stimulating Hormone) (84443-TSH) TLB-B12, Serum-Total ONLY (21308-M57)  Problem # 4:  HYPERCHOLESTEROLEMIA, BORDERLINE (ICD-272.4) Stable on diet alone...  Problem # 5:  DIVERTICULAR DISEASE (ICD-562.10) GI is stable w/ colon f/u due 2013...  Problem # 6:  DEGENERATIVE JOINT DISEASE (ICD-715.90) Stable>  he knows to stay active & exercise... His updated medication list for this problem includes:    Adult  Aspirin Ec Low Strength 81 Mg Tbec (Aspirin) .[name]... 1 by mouth once daily  Problem # 7:  ANEMIA, PERNICIOUS (ICD-281.0) Stable on B12 shots Qmonth... His updated medication list for this problem includes:    Cobal-1000 1000 Mcg/ml Inj Soln (Cyanocobalamin) .[name]... 1 injection every month  Complete Medication List: 1)  Adult Aspirin Ec Low Strength 81 Mg Tbec (Aspirin) .[name].. 1 by mouth once daily 2)  Norvasc 5 Mg Tabs (Amlodipine besylate) .[name].. 1 by mouth once daily 3)  Diovan 160 Mg  Tabs (Valsartan) .[name].. 1 by mouth once daily 4)  Centrum Silver Tabs (Multiple vitamins-minerals) .... Once daily 5)  Cobal-1000 1000 Mcg/ml Inj Soln (Cyanocobalamin) .[name].. 1 injection every month 6)  Tamsulosin Hcl 0.4 Mg Caps (Tamsulosin hcl) .... Take 1 tablet by mouth once a day  Patient Instructions: 1)  Today we updated your med list- see below.... 2)  We refilled your perscriptions for 2012... 3)  Today we did your follow up CXR & FASTING blood work... please call the "phone tree" in a few days for your lab results.Marland KitchenMarland Kitchen 4)  Keep up the good job w/ diet + exercise... 5)  Call for any problems.Marland KitchenMarland Kitchen 6)  Contionue the B12 shots monthly... 7)  Please schedule a follow-up appointment in 1 year, sooner as needed. Prescriptions: DIOVAN 160 MG  TABS (VALSARTAN) 1 by mouth once daily  #30 x 12   Entered and Authorized by:   Michele Mcalpine MD   Signed by:   Michele Mcalpine MD on 12/06/2010   Method used:   Print then Give to Patient   RxID:   [id number] NORVASC 5 MG  TABS (AMLODIPINE BESYLATE) 1 by mouth once daily  #30 x 12   Entered and Authorized by:   Michele Mcalpine MD   Signed by:   Michele Mcalpine MD on 12/06/2010   Method used:   Print then Give to Patient   RxID:   [id number]

## 2010-12-30 NOTE — Letter (Signed)
[facility]   Imported By: Sherian Rein 04/12/2010 10:52:57  _____________________________________________________________________  External Attachment:    Type:   Image     Comment:   External Document

## 2010-12-30 NOTE — Letter (Signed)
[facility]   Imported By: Sherian Rein 06/29/2010 09:16:13  _____________________________________________________________________  External Attachment:    Type:   Image     Comment:   External Document

## 2010-12-30 NOTE — Assessment & Plan Note (Signed)
Summary: xolair 1 month///kp   Nurse Visit   Allergies: 1)  ! Lisinopril (Lisinopril)  Medication Administration  Injection # 1:    Medication: Vit B12 1000 mcg    Diagnosis: UNSPECIFIED ANEMIA (ICD-285.9)    Route: SQ    Site: L deltoid    Exp Date: 07/30/2011    Lot #: [id number]    Mfr: [employer]    Comments: B-12 1000MG     Patient tolerated injection without complications    Given by: TAMMY Jianni Batten IN ALLERGY LAB  Orders Added: 1)  Vit B12 1000 mcg [J3420] 2)  Admin of Therapeutic Inj  intramuscular or subcutaneous [96372]   Medication Administration  Injection # 1:    Medication: Vit B12 1000 mcg    Diagnosis: UNSPECIFIED ANEMIA (ICD-285.9)    Route: SQ    Site: L deltoid    Exp Date: 07/30/2011    Lot #: [id number]    Mfr: [employer]    Comments: B-12 1000MG     Patient tolerated injection without complications    Given by: TAMMY Destina Mantei IN ALLERGY LAB  Orders Added: 1)  Vit B12 1000 mcg [J3420] 2)  Admin of Therapeutic Inj  intramuscular or subcutaneous [13086]

## 2010-12-30 NOTE — Assessment & Plan Note (Signed)
Summary: b-12/apc   Nurse Visit   Allergies: 1)  ! Lisinopril (Lisinopril)  Medication Administration  Injection # 1:    Medication: Vit B12 1000 mcg    Diagnosis: UNSPECIFIED ANEMIA (ICD-285.9)    Route: IM    Site: L deltoid    Exp Date: 07/2011    Lot #: [id number]    Mfr: [employer]    Comments: Injection given by Drucie Opitz, CMA in allergy lab.     Patient tolerated injection without complications  Orders Added: 1)  Admin of Therapeutic Inj  intramuscular or subcutaneous [96372] 2)  Vit B12 1000 mcg [J3420]

## 2010-12-30 NOTE — Assessment & Plan Note (Signed)
Summary: B-12/APC   Nurse Visit   Allergies: 1)  ! Lisinopril (Lisinopril)  Medication Administration  Injection # 1:    Medication: Vit B12 1000 mcg    Diagnosis: UNSPECIFIED ANEMIA (ICD-285.9)    Route: SQ    Site: R deltoid    Exp Date: 07/2011    Lot #: [id number]    Mfr: [employer]    Comments: Injection given by Dimas Millin in allergy lab.     Patient tolerated injection without complications  Orders Added: 1)  Admin of Therapeutic Inj  intramuscular or subcutaneous [96372] 2)  Vit B12 1000 mcg [J3420]

## 2010-12-30 NOTE — Assessment & Plan Note (Signed)
   Nurse Visit   Allergies: 1)  ! Lisinopril (Lisinopril)  Medication Administration  Injection # 1:    Medication: Vit B12 1000 mcg    Diagnosis: UNSPECIFIED ANEMIA (ICD-285.9)    Route: SQ    Site: R deltoid    Exp Date: 03/2012    Lot #: [id number]    Mfr: [employer]    Comments: vitamin b-12    Given by: Dimas Millin, Allergy Technician  Orders Added: 1)  Vit B12 1000 mcg [J3420] 2)  Admin of Therapeutic Inj  intramuscular or subcutaneous [96372]    Medication Administration  Injection # 1:    Medication: Vit B12 1000 mcg    Diagnosis: UNSPECIFIED ANEMIA (ICD-285.9)    Route: SQ    Site: R deltoid    Exp Date: 03/2012    Lot #: [id number]    Mfr: [employer]    Comments: vitamin b-12    Given by: Dimas Millin, Allergy Technician  Orders Added: 1)  Vit B12 1000 mcg [J3420] 2)  Admin of Therapeutic Inj  intramuscular or subcutaneous [29562]

## 2010-12-30 NOTE — Letter (Signed)
Summary: [facility]  [facility]   Imported By: Lennie Odor 09/16/2010 11:15:05  _____________________________________________________________________  External Attachment:    Type:   Image     Comment:   External Document

## 2010-12-30 NOTE — Letter (Signed)
Summary: [facility]   Imported By: Sherian Rein 12/04/2009 12:25:49  _____________________________________________________________________  External Attachment:    Type:   Image     Comment:   External Document

## 2010-12-30 NOTE — Consult Note (Signed)
Summary: [facility]   Imported By: Lester [place] 02/08/2010 09:59:03  _____________________________________________________________________  External Attachment:    Type:   Image     Comment:   External Document

## 2010-12-30 NOTE — Assessment & Plan Note (Signed)
Summary: b-12/apc   Nurse Visit   Allergies: 1)  ! Lisinopril (Lisinopril)  Medication Administration  Injection # 1:    Medication: Vit B12 1000 mcg    Diagnosis: UNSPECIFIED ANEMIA (ICD-285.9)    Route: SQ    Site: R deltoid    Exp Date: 07/30/2011    Lot #: [id number]    Mfr: [employer]    Comments: B-12 1000MG  CHARGED J3420 AND  96045    Patient tolerated injection without complications    Given by: TAMMY Brittinee Risk IN ALLERGY LAB    Medication Administration  Injection # 1:    Medication: Vit B12 1000 mcg    Diagnosis: UNSPECIFIED ANEMIA (ICD-285.9)    Route: SQ    Site: R deltoid    Exp Date: 07/30/2011    Lot #: [id number]    Mfr: [employer]    Comments: B-12 1000MG  CHARGED J3420 AND  11914    Patient tolerated injection without complications    Given by: TAMMY Areliz Rothman IN ALLERGY LAB

## 2010-12-30 NOTE — Assessment & Plan Note (Signed)
Summary: b-12/apc   Nurse Visit   Allergies: 1)  ! Lisinopril (Lisinopril)  Medication Administration  Injection # 1:    Medication: Vit B12 1000 mcg    Diagnosis: UNSPECIFIED ANEMIA (ICD-285.9)    Route: SQ    Site: L deltoid    Exp Date: 08/01/2011    Lot #: [id number]    Mfr: [employer]    Comments: B12 1000MG  IN LEFT ARM    Patient tolerated injection without complications    Given by: TAMMY Leonette Tischer IN ALLERGY LAB  Orders Added: 1)  Vit B12 1000 mcg [J3420] 2)  Admin of Therapeutic Inj  intramuscular or subcutaneous [96372]   Medication Administration  Injection # 1:    Medication: Vit B12 1000 mcg    Diagnosis: UNSPECIFIED ANEMIA (ICD-285.9)    Route: SQ    Site: L deltoid    Exp Date: 08/01/2011    Lot #: [id number]    Mfr: [employer]    Comments: B12 1000MG  IN LEFT ARM    Patient tolerated injection without complications    Given by: TAMMY Lovelyn Sheeran IN ALLERGY LAB  Orders Added: 1)  Vit B12 1000 mcg [J3420] 2)  Admin of Therapeutic Inj  intramuscular or subcutaneous [[id number]]

## 2010-12-30 NOTE — Assessment & Plan Note (Signed)
Summary: xolair///kp   Nurse Visit   Allergies: 1)  ! Lisinopril (Lisinopril)  Medication Administration  Injection # 1:    Medication: Vit B12 1000 mcg    Diagnosis: UNSPECIFIED ANEMIA (ICD-285.9)    Route: IM    Site: R deltoid    Exp Date: 12/30/2011    Lot #: [id number]    Mfr: [employer]    Comments: vitamin b-12 1000 micrograms, im in right arm, pt tolerated injection    Given by: Tammy Scott, allergy tech  Orders Added: 1)  Admin of Therapeutic Inj  intramuscular or subcutaneous [96372] 2)  Vit B12 1000 mcg [J3420]

## 2010-12-30 NOTE — Assessment & Plan Note (Signed)
Summary: b-12/apc   Nurse Visit   Allergies: 1)  ! Lisinopril (Lisinopril)  Medication Administration  Injection # 1:    Medication: Vit B12 1000 mcg    Diagnosis: UNSPECIFIED ANEMIA (ICD-285.9)    Route: SQ    Site: L deltoid    Exp Date: 08/01/2011    Lot #: [id number]    Mfr: [employer]    Comments: B-12     Patient tolerated injection without complications    Given by: SUSANNE FORD IN ALLERGY LAB  Orders Added: 1)  Vit B12 1000 mcg [J3420] 2)  Admin of Therapeutic Inj  intramuscular or subcutaneous [96372]   Medication Administration  Injection # 1:    Medication: Vit B12 1000 mcg    Diagnosis: UNSPECIFIED ANEMIA (ICD-285.9)    Route: SQ    Site: L deltoid    Exp Date: 08/01/2011    Lot #: [id number]    Mfr: [employer]    Comments: B-12     Patient tolerated injection without complications    Given by: SUSANNE FORD IN ALLERGY LAB  Orders Added: 1)  Vit B12 1000 mcg [J3420] 2)  Admin of Therapeutic Inj  intramuscular or subcutaneous [30865]

## 2011-01-05 NOTE — Assessment & Plan Note (Signed)
Summary: b12 inj//sh   Nurse Visit   Allergies: 1)  ! Lisinopril (Lisinopril)  Medication Administration  Injection # 1:    Medication: Vit B12 1000 mcg    Diagnosis: UNSPECIFIED ANEMIA (ICD-285.9)    Route: IM    Site: L deltoid    Exp Date: 05/2012    Lot #: [id number]    Mfr: [employer]    Comments: CHARGED [id number] AND J3420    Given by: Drucie Opitz IN ALLERGY LAB  Orders Added: 1)  Admin of Injection (IM/SQ) [45409] 2)  Vit B12 1000 mcg [J3420]   Medication Administration  Injection # 1:    Medication: Vit B12 1000 mcg    Diagnosis: UNSPECIFIED ANEMIA (ICD-285.9)    Route: IM    Site: L deltoid    Exp Date: 05/2012    Lot #: [id number]    Mfr: [employer]    Comments: CHARGED [id number] AND J3420    Given by: Drucie Opitz IN ALLERGY LAB  Orders Added: 1)  Admin of Injection (IM/SQ) [82956] 2)  Vit B12 1000 mcg [J3420]

## 2011-01-31 ENCOUNTER — Encounter: Payer: Self-pay | Admitting: Pulmonary Disease

## 2011-01-31 ENCOUNTER — Ambulatory Visit (INDEPENDENT_AMBULATORY_CARE_PROVIDER_SITE_OTHER): Payer: MEDICARE

## 2011-01-31 DIAGNOSIS — D649 Anemia, unspecified: Secondary | ICD-10-CM

## 2011-02-08 NOTE — Assessment & Plan Note (Signed)
Summary: B12   Nurse Visit   Allergies: 1)  ! Lisinopril (Lisinopril)  Medication Administration  Injection # 1:    Medication: Vit B12 1000 mcg    Diagnosis: UNSPECIFIED ANEMIA (ICD-285.9)    Route: SQ    Site: L deltoid    Exp Date: 05/2012    Lot #: [id number]    Mfr: [employer]    Comments: 1000 MCG    Given by: TAMMY Myrl Lazarus IN ALLERGY LAB  Orders Added: 1)  Vit B12 1000 mcg [J3420] 2)  Admin of Injection (IM/SQ) [45409]   Medication Administration  Injection # 1:    Medication: Vit B12 1000 mcg    Diagnosis: UNSPECIFIED ANEMIA (ICD-285.9)    Route: SQ    Site: L deltoid    Exp Date: 05/2012    Lot #: [id number]    Mfr: [employer]    Comments: 1000 MCG    Given by: TAMMY Allycia Pitz IN ALLERGY LAB  Orders Added: 1)  Vit B12 1000 mcg [J3420] 2)  Admin of Injection (IM/SQ) [82956]

## 2011-02-13 LAB — CBC
Hemoglobin: 15.6 g/dL (ref 13.0–17.0)
MCV: 88.9 fL (ref 78.0–100.0)
RBC: 5.29 MIL/uL (ref 4.22–5.81)
WBC: 7.1 10*3/uL (ref 4.0–10.5)

## 2011-02-13 LAB — COMPREHENSIVE METABOLIC PANEL
ALT: 18 U/L (ref 0–53)
Albumin: 3.9 g/dL (ref 3.5–5.2)
Alkaline Phosphatase: 72 U/L (ref 39–117)
Calcium: 10 mg/dL (ref 8.4–10.5)
Creatinine, Ser: 1.06 mg/dL (ref 0.4–1.5)
GFR calc non Af Amer: >60 mL/min (ref >60)
Glucose, Bld: 109 mg/dL — ABNORMAL HIGH (ref 70–99)
Sodium: 139 mEq/L (ref 135–145)
Total Bilirubin: 1.7 mg/dL — ABNORMAL HIGH (ref 0.3–1.2)
Total Protein: 6.8 g/dL (ref 6.0–8.3)

## 2011-02-13 LAB — POCT I-STAT 4, (NA,K, GLUC, HGB,HCT)
HCT: 44 % (ref 39.0–52.0)
Hemoglobin: 15 g/dL (ref 13.0–17.0)
Sodium: 144 mEq/L (ref 135–145)

## 2011-02-13 LAB — PROTIME-INR: Prothrombin Time: 13.4 seconds (ref 11.6–15.2)

## 2011-03-03 ENCOUNTER — Ambulatory Visit (INDEPENDENT_AMBULATORY_CARE_PROVIDER_SITE_OTHER): Payer: MEDICARE

## 2011-03-03 DIAGNOSIS — D649 Anemia, unspecified: Secondary | ICD-10-CM

## 2011-03-03 MED ORDER — CYANOCOBALAMIN 1000 MCG/ML IJ SOLN
1000.0000 ug | Freq: Once | INTRAMUSCULAR | Status: AC
  Administered 2011-03-03: 1000 ug via INTRAMUSCULAR

## 2011-04-04 ENCOUNTER — Ambulatory Visit (INDEPENDENT_AMBULATORY_CARE_PROVIDER_SITE_OTHER): Payer: MEDICARE

## 2011-04-04 DIAGNOSIS — D649 Anemia, unspecified: Secondary | ICD-10-CM

## 2011-04-04 MED ORDER — CYANOCOBALAMIN 1000 MCG/ML IJ SOLN
1000.0000 ug | Freq: Once | INTRAMUSCULAR | Status: AC
  Administered 2011-04-04: 1000 ug via INTRAMUSCULAR

## 2011-04-15 NOTE — Op Note (Signed)
[facility]  Patient:    Joseph Carter, Joseph Carter Visit Number: [id number] MRN: [id number]          Service Type: DSU Location: DAY Attending Physician:  Dominica Severin Dictated by:   Dominica Severin III, M.D. Proc. 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  PROCEDURE: 1. Scaphoid excision, right wrist. 2. Four corner fusion, right wrist, with autologous bone graft using distal    radius bone graft and scaphoid bone excision remnants. 3. Posterior interosseous nerve neurectomy. 4. Partial radial styloidectomy. 5. Stress radiography.  SURGEON:  Aron Baba, M.D.  ASSISTANT:  Ottie Glazier. Wynona Neat, P.A.-C.  COMPLICATIONS:  None.  ANESTHESIA:  General.  TOURNIQUET TIME:  Less than two hours.  DRAINS:  One.  ESTIMATED BLOOD LOSS:  Minimal.  INDICATIONS FOR PROCEDURE:  This patient is a very pleasant 77 year old male who presents with the above mentioned diagnosis. I have counselled him in regards to the risks and benefits of surgery including the risk of infection, bleeding, anesthesia, damage to normal structures and failure of the surgery to accomplish its intended goals of relieving symptoms and restoring function. With this in mind, the patient desires to proceed. All questions have been encouraged and answered preoperatively.  OPERATIVE FINDINGS:  The patient had stage 3 degenerative slack wrist. He underwent the above mentioned procedure without difficulty. All looked excellent at the termination of the procedure. The patient tolerated the procedure well. He had excellent range of  motion and no dorsal lip impingement with extension with to approximately 45 degrees. The patient had excellent stability and good screw placement.  DESCRIPTION OF PROCEDURE:  The patient was seen by myself and anesthesia. He was taken to the operative suite. He underwent general endotracheal anesthesia. Following this, he underwent appropriate padding, prep and draped in the usual sterile fashion was then applied about the right upper extremity after preoperative antibiotics were given.  The patient then underwent an elevation of the arm, tourniquet was insufflated to 250 mmHg and a midline incision was made. A utilitarian approach to the dorsal radius and carpal bones was used. The patient had the extensor retinaculum Z lengthened. The EPL tendon was transposed in the subcutaneous tissue. Following this, the interval between the third and fourth dorsal compartments was created. The patient underwent a T capsule incision and following this underwent a posterior interosseous nerve neurectomy with crushing and cauterization, this was the large nerve and was approximately 4 cm in the neurectomy. This was done without difficulty and cauterized. Following this, the patient underwent identification of the wrist capsule. The radial lunate fossa was in excellent condition. The capitate-lunate and scaphoid fossa was severely degenerative end-stage in nature. At this point in time, I performed a scaphoid excision without difficulty and kept the scaphoid bone at a separate sterile table, prepared this. Cancellous bone graft from the scaphoid was thus used. Following scaphoid excision, the patient then underwent a partial styloidectomy taking care to remove 8 mm and no more. The radioscaphocapitate ligament was preserved and identified during this. This was filed down and then smoothed. Following  this, the patient underwent preparation of the capitate-hamate, triquetrium-hamate and triquetrium-lunate  surfaces. The patient had debridement down to cancellous bone and following this preplaced K wires were placed in the wrist volar in location to stabilize the construct. I corrected the dorsal tilt in the lunate with a joy stick and placed a radial lunate pin to keep the lunate centralized. Following this, I packed bone graft from the previously prepared scaphoid and from bone graft taken from harvest around Listers tubercle. This bone graft from Listers tubercle was taken without difficulty and to my satisfaction. I was very pleased with this and there were no complications. Once this was done, the patient then had bone grafting applied to the area copiously. I checked this under x-ray and made sure that all areas were tightly packed. With this noted, the patient then underwent placement of a hubcap AcroMed plate. The patient had the reaming performed. Following this, the plate was placed and the patient underwent placement of the hamate screw, followed by a lunate screw, followed by a capitate screw, followed by a triquetrium screw, followed by screws in the hamate, triquetrum and lunate once again. Thus seven screws were used, one in the hamate, two in the other bones (hamate, capitate, and triquetrium). The patient then had additional bone grafting placed. The hubcap top was then placed without difficulty. I removed all pins and then ranged the wrist. The patient had excellent extension and flexion to 35 degrees. The patient then underwent repair of the capsule about the longitudinal incision. The transverse incision was left open so as not to decrease wrist range of motion postoperatively. Following this, the patient then underwent closure of the Z lengthened retinaculum so that there would be no extensor tendon bowstringing. The patient tolerated this well. Following this, TLS drain #7 was placed and the wound was then closed with a Vicryl in the subcu followed by 4-0 Prolene in the  skin edge. The drain was hooked up to suction, the patient was placed in a sugar tong splint without difficulty. Following this, drapes were  removed. The patient tolerated the procedure well and there were no complications in the orthopedic portion of the procedure. He will be admitted for IV antibiotics, pain management and general observation. It has been a pleasure to participate in his care and I look forward to participating in his postop recovery. All questions have been encouraged and answered. Dictated by:   Dominica Severin III, M.D. Attending Physician:  Dominica Severin DD:  06/06/02 TD:  06/10/02 Job: [id number]

## 2011-04-15 NOTE — H&P (Signed)
[facility]  Patient:    Joseph Carter, Joseph Carter Visit Number: [id number] MRN: [id number]          Service Type: Attending:  Elisha Ponder, M.D. Dictated by:   Ottie Glazier. Wynona Neat, P.A.-C. Adm. Date:  06/06/02                           History and Physical  CHIEF COMPLAINT:  Right wrist pain.  HISTORY OF PRESENT ILLNESS:  The patient is a 77 year old male, right-hand dominant, with a long history of right wrist pain secondary to end-stage osteoarthritis of the carpal bones.  More specifically, he has a stage III slack wrist with a DISI deformity.  Despite conservative treatment, the patient has had continued and increasing symptomatology, to the point where he opts to undergo surgical intervention secondary to the pain interfering with his ADLs.  ALLERGIES:  No known drug allergies.  MEDICATIONS: 1. Norvasc 5 mg 1 p.o. q.d. 2. Lisinopril 20 mg 1 p.o. q.d.  PAST MEDICAL HISTORY: 1. Hypertension. 2. Mild COPD. 3. Questionable history of PACs. 4. Mild aortic insufficiency.  He was seen in cardiac evaluation in 1998, by    Dr. Jens Som.  A 2-D echocardiogram showed normal left ventricular    function, mild left ventricular hypertrophy, and mild aortic insufficiency.  PAST SURGICAL HISTORY: 1. Appendectomy. 2. A "cyst" removed from his lung that was benign in nature. 3. Back surgery for what appears to be spinal stenosis.  He is uncertain of    the details.  SOCIAL HISTORY:  He is married.  He is a retired Curator.  He does not smoke. He does consume alcohol.  States he occasionally drinks beer and drinks about a fifth of Christiane Ha in a months time.  PRIMARY CARE Tymel Conely:  Dr. Kriste Basque.  FAMILY HISTORY:  His mother had "seizures."  REVIEW OF SYSTEMS:  GENERAL:  Denies any recent weight loss, fever, chills, night sweats.  HEENT:  Denies any history of chronic headaches, ringing of the ears, runny nose, sore throat.  CHEST:  Denies any chronic cough,  productive cough, or hemoptysis.  CARDIOVASCULAR:  Denies any type of chest pain, syncopal episodes, or irregular heartbeats.  GASTROINTESTINAL/GENITOURINARY: Denies any history of chronic diarrhea, constipation, melena, bright red stools per rectum.  No history of frequency, urgency, nocturia.  EXTREMITIES: Please see HPI.  NEUROLOGIC:  Denies any history of seizures or strokes.  PHYSICAL EXAMINATION:  VITAL SIGNS:  Blood pressure 138/80, respirations 14, pulse 64.  GENERAL:  Pleasant 77 year old man in no acute distress.  Very talkative.  HEENT:  Head atraumatic, normocephalic.  NECK:  Supple.  Without masses.  No carotid bruits auscultated.  CHEST:  Clear to auscultation bilaterally.  HEART:  Regular rate and rhythm.  S1, S2.  ABDOMEN:  Soft and nontender.  Bowel sounds are positive.  EXTREMITIES:  He has grimace with a hand shake secondary to pain in his right wrist.  The patient does have range of motion of 30 degrees, extension to 15 degrees with flexion of the right wrist as compared to 60/65 on the left. Radial and ulnar deviation are quite tender on the right, nontender on the left.  He is neurovascularly intact.  FDP, FDS, and extensor function is intact about the index through small fingers bilaterally.  EPL and FPL are intact.  Elbows and shoulders are nontender, with passive range of motion today.  The patient has most of the pain overlying the radiocarpal  joint with soft tissue swelling noted and restriction of motion as mentioned above.  LABORATORY DATA:  X-rays reviewed show scapholunate advanced collapsed degenerative wrist changes.  He has end-stage change of the capitolunate joint as well as the scaphoid fossa.  IMPRESSION: 1. Stage III slack wrist with dorsal intercalated segmental instability    deformity. 2. Hypertension. 3. Mild chronic obstructive pulmonary disease. 4. Questionable history of premature atrial contractions, as the patient    states  that this was an error in his Holter monitor.  PLAN:  The patient will be admitted to [facility] to undergo a right scaphoid excision and four-corner fusion with bone graft per Dr. Amanda Pea on June 06, 2002, at 3 p.m.  The risks and benefits have been discussed with the patient in great detail, for which he stated good understanding prior to entering the operating suite. Dictated by:   Ottie Glazier. Wynona Neat, P.A.-C. Attending:  Elisha Ponder, M.D. DD:  05/30/02 TD:  06/03/02 Job: [id number]

## 2011-04-15 NOTE — Op Note (Signed)
[place]  Patient:    BRION, SOSSAMON                        MRN: [id number] Proc. Date: 04/19/00 Adm. Date:  [id number] Disc. Date: [id number] Attending:  Colon Branch                           Operative Report  PREOPERATIVE DIAGNOSIS:  Left L4-5 herniated disk with extruded fragment.  POSTOPERATIVE DIAGNOSIS:  Left L4-5 herniated disk with extruded fragment.  PROCEDURE:  Left L4-5 semihemilaminectomy and diskectomy with microscope for microdissection.  SURGEON:  Clydene Fake, M.D.  ASSISTANT:  Izell [place]. Elesa Hacker, M.D.  ANESTHESIA:  General endotracheal tube.  ESTIMATED BLOOD LOSS:  Minimal.  BLOOD GIVEN:  None.  DRAINS:  None.  COMPLICATIONS:  None.  REASON FOR PROCEDURE:  The patient is a 77 year old gentleman who has had a history of progressive left leg pain to the top of the foot in the L5 distribution with weakness in centralis longus in dorsiflexion and numbness, and MRI shows a large disk herniation with extruded fragment at left L4-5 along with some stenosis at that level due to spinal _____ changes.  Patient brought in for surgery.  DESCRIPTION OF PROCEDURE:  The patient was brought in the operating room and general anesthesia was induced.  The patient was placed in a prone position with all pressure points padded, on a Wilson frame.  The patient was then prepped and draped in a sterile fashion and the site of incision was injected with anesthesia using 1% lidocaine with epinephrine.  A needle was placed in what was thought to be the L4-5 interspace, x-ray obtained, and this did show the needle at the L4-5 interspace.  An incision was then made in the midline of the lumbar spine centered over this area, and the incision taken down to the fascia and hemostasis obtained with Bovie cauterization.  The fascia was incised with a Bovie and subperiosteal dissection was done over the L4 and L5 spinous processes and lamina up to  the facet.  Retractors were then placed and a marker placed in the interspace, x-ray obtained showing this was the L4-5 interspace.  The microscope was brought in for microdissection at this point, and using a drill and Kerrison punches, a semihemilaminectomy was performed on the L4-5 interspace, removing the inferior part of L4 and the superior part of the L5 lamina and a small amount of the medial facet.  Ligamentum flavum was removed.  Dura was then retracted medially and the disk space was incised with a 15 blade and diskectomy performed with pituitary rongeurs and curettes. There was a very large bulging of the disk as we worked on this and then went down over the L5 vertebral body and found a fragment there.  We then explored the L5 root, and there was a compression from beneath.  There was another subligamentous extruded fragment following the root out to its foramen, and this was removed.  After the dura and nerve root were decompressed, actually some Gelfoam and thrombin were placed in the epidural space and then removed. Retractor was removed.  Hemostasis in the paraspinous muscles obtained with Bovie cauterization.  The wound was irrigated with antibiotic solution and the fascia closed with 0 Vicryl interrupted suture, the subcutaneous tissue closed with 0, 2-0 and 3-0 Vicryl interrupted suture, and Dermabond skin glue had been placed on  the skin.  A dressing was placed.  The patient was placed back into a supine position, awakened from anesthesia, and transferred to the recovery room in stable condition. DD:  04/19/00 TD:  04/24/00 Job: [id number]

## 2011-04-15 NOTE — Discharge Summary (Signed)
[place]  Patient:    Joseph Carter, Joseph Carter                        MRN: [id number] Adm. Date:  [id number] Disc. Date: [id number] Attending:  Colon Branch                           Discharge Summary  ADMISSION DIAGNOSIS: Herniated nucleus pulposus, left L4-5.  DISCHARGE DIAGNOSIS: Herniated nucleus pulposus, left L4-5.  OPERATION/PROCEDURE: Left L4-5 semi-hemilaminectomy and diskectomy with microdissection with microscope.  HISTORY OF PRESENT ILLNESS: The patient is a 77 year old right-handed gentleman who for a month had been having severe left hip and then left leg pain doing down to the top of his foot.  Walking made this worse.  He was having trouble standing and walking and steroid dospak did not help much.  He did not have any specific complaints of weakness.  PHYSICAL EXAMINATION: On examination he had a positive straight leg raise and weakness in the extensor hallucis longus and dorsiflexion on the left side. Sensation was also decreased in the L5 distribution on the left.  MRI was done showing disk herniation at left L4-5 and the patient was brought in for surgery.  HOSPITAL COURSE: The patient was admitted and the same day underwent the above noted procedure without complications.  Postoperatively the patient was transferred to the recovery room and then to the Floor the following day.  He has been ambulating well and had no leg pain, with improved numbness.  He did have some low back pain, though.  Sensation was intact.  All this was improved from preoperatively.  The patient was ambulating and eating well, and was discharged home in stable condition.  DISCHARGE MEDICATIONS:  1. Continue medications as prior to admission.  2. Vicodin ES 1-2 p.o. q.4h p.r.n. pain.  3. Muscle relaxants p.r.n.  DISCHARGE ACTIVITY: No strenuous activity.  FOLLOW-UP: The patient is to follow up in two weeks in my office for a wound check. DD:   06/06/00 TD:  06/07/00 Job: [id number]

## 2011-05-05 ENCOUNTER — Telehealth: Payer: Self-pay | Admitting: Pulmonary Disease

## 2011-05-05 ENCOUNTER — Ambulatory Visit (INDEPENDENT_AMBULATORY_CARE_PROVIDER_SITE_OTHER): Payer: Medicare Other

## 2011-05-05 DIAGNOSIS — D649 Anemia, unspecified: Secondary | ICD-10-CM

## 2011-05-05 MED ORDER — CYANOCOBALAMIN 1000 MCG/ML IJ SOLN
1000.0000 ug | Freq: Once | INTRAMUSCULAR | Status: AC
  Administered 2011-05-05: 1000 ug via INTRAMUSCULAR

## 2011-05-05 NOTE — Telephone Encounter (Signed)
LMOM TCB x2

## 2011-05-05 NOTE — Telephone Encounter (Signed)
LMOM TCB x1.  Pt does come into the office approx every 28-30 days for b12 injection.

## 2011-05-05 NOTE — Telephone Encounter (Signed)
Pt wants to know if his wife can give his B12 injection to keep from

## 2011-05-05 NOTE — Telephone Encounter (Signed)
Pt returning call

## 2011-05-05 NOTE — Telephone Encounter (Signed)
Per SN---if she is capable of doing this,  She will need to come here and learn how to give a shot first.  thanks

## 2011-05-05 NOTE — Telephone Encounter (Signed)
Pt wants to know if his wife can administer his B12 injection monthly to save him time and money. Please advise. Thanks.

## 2011-05-05 NOTE — Telephone Encounter (Signed)
ATC pt on cell #, was busy.  Called home number, spoke with patient's wife.  Advised of SN's recs.  Pt's wife will discuss with patient and call us back with their decision.  Will sign off on msg and await their call back.

## 2011-06-06 ENCOUNTER — Ambulatory Visit (INDEPENDENT_AMBULATORY_CARE_PROVIDER_SITE_OTHER): Payer: Medicare Other

## 2011-06-06 DIAGNOSIS — D649 Anemia, unspecified: Secondary | ICD-10-CM

## 2011-06-07 DIAGNOSIS — D649 Anemia, unspecified: Secondary | ICD-10-CM

## 2011-06-07 MED ORDER — CYANOCOBALAMIN 1000 MCG/ML IJ SOLN
1000.0000 ug | Freq: Once | INTRAMUSCULAR | Status: AC
  Administered 2011-06-07: 1000 ug via INTRAMUSCULAR

## 2011-07-07 ENCOUNTER — Ambulatory Visit (INDEPENDENT_AMBULATORY_CARE_PROVIDER_SITE_OTHER): Payer: Medicare Other

## 2011-07-07 DIAGNOSIS — D649 Anemia, unspecified: Secondary | ICD-10-CM

## 2011-07-07 MED ORDER — CYANOCOBALAMIN 1000 MCG/ML IJ SOLN
1000.0000 ug | Freq: Once | INTRAMUSCULAR | Status: AC
  Administered 2011-07-07: 1000 ug via INTRAMUSCULAR

## 2011-08-04 ENCOUNTER — Ambulatory Visit (INDEPENDENT_AMBULATORY_CARE_PROVIDER_SITE_OTHER): Payer: Medicare Other

## 2011-08-04 DIAGNOSIS — D649 Anemia, unspecified: Secondary | ICD-10-CM

## 2011-08-04 MED ORDER — CYANOCOBALAMIN 1000 MCG/ML IJ SOLN
1000.0000 ug | Freq: Once | INTRAMUSCULAR | Status: AC
  Administered 2011-08-04: 1000 ug via INTRAMUSCULAR

## 2011-09-01 ENCOUNTER — Ambulatory Visit (INDEPENDENT_AMBULATORY_CARE_PROVIDER_SITE_OTHER): Payer: Medicare Other

## 2011-09-01 DIAGNOSIS — D649 Anemia, unspecified: Secondary | ICD-10-CM

## 2011-09-01 MED ORDER — CYANOCOBALAMIN 1000 MCG/ML IJ SOLN
1000.0000 ug | Freq: Once | INTRAMUSCULAR | Status: AC
  Administered 2011-09-01: 1000 ug via INTRAMUSCULAR

## 2011-10-03 ENCOUNTER — Ambulatory Visit (INDEPENDENT_AMBULATORY_CARE_PROVIDER_SITE_OTHER): Payer: Medicare Other

## 2011-10-03 DIAGNOSIS — D649 Anemia, unspecified: Secondary | ICD-10-CM

## 2011-10-04 DIAGNOSIS — D649 Anemia, unspecified: Secondary | ICD-10-CM

## 2011-10-04 MED ORDER — CYANOCOBALAMIN 1000 MCG/ML IJ SOLN
1000.0000 ug | Freq: Once | INTRAMUSCULAR | Status: AC
  Administered 2011-10-04: 1000 ug via INTRAMUSCULAR

## 2011-10-27 ENCOUNTER — Encounter: Payer: Self-pay | Admitting: *Deleted

## 2011-10-27 NOTE — Progress Notes (Unsigned)
01/22/10 ippis results score=2, iief result =3,4,5,4,4,1,5,4,5,5,4,3,5,5,4

## 2011-11-02 ENCOUNTER — Ambulatory Visit (INDEPENDENT_AMBULATORY_CARE_PROVIDER_SITE_OTHER): Payer: Medicare Other

## 2011-11-02 DIAGNOSIS — D649 Anemia, unspecified: Secondary | ICD-10-CM

## 2011-11-02 MED ORDER — CYANOCOBALAMIN 1000 MCG/ML IJ SOLN
1000.0000 ug | Freq: Once | INTRAMUSCULAR | Status: AC
  Administered 2011-11-02: 1000 ug via INTRAMUSCULAR

## 2011-12-06 ENCOUNTER — Ambulatory Visit (INDEPENDENT_AMBULATORY_CARE_PROVIDER_SITE_OTHER): Payer: Medicare Other | Admitting: Pulmonary Disease

## 2011-12-06 ENCOUNTER — Other Ambulatory Visit (INDEPENDENT_AMBULATORY_CARE_PROVIDER_SITE_OTHER): Payer: Medicare Other

## 2011-12-06 ENCOUNTER — Encounter: Payer: Self-pay | Admitting: Pulmonary Disease

## 2011-12-06 ENCOUNTER — Ambulatory Visit (INDEPENDENT_AMBULATORY_CARE_PROVIDER_SITE_OTHER)
Admission: RE | Admit: 2011-12-06 | Discharge: 2011-12-06 | Disposition: A | Discharge status: Home / Self Care | Payer: Medicare Other | Source: Ambulatory Visit | Attending: Pulmonary Disease | Admitting: Pulmonary Disease

## 2011-12-06 ENCOUNTER — Ambulatory Visit (INDEPENDENT_AMBULATORY_CARE_PROVIDER_SITE_OTHER): Payer: Medicare Other

## 2011-12-06 DIAGNOSIS — M47812 Spondylosis without myelopathy or radiculopathy, cervical region: Secondary | ICD-10-CM

## 2011-12-06 DIAGNOSIS — Z77090 Contact with and (suspected) exposure to asbestos: Secondary | ICD-10-CM

## 2011-12-06 DIAGNOSIS — C61 Malignant neoplasm of prostate: Secondary | ICD-10-CM

## 2011-12-06 DIAGNOSIS — I10 Essential (primary) hypertension: Secondary | ICD-10-CM

## 2011-12-06 DIAGNOSIS — F411 Generalized anxiety disorder: Secondary | ICD-10-CM

## 2011-12-06 DIAGNOSIS — D510 Vitamin B12 deficiency anemia due to intrinsic factor deficiency: Secondary | ICD-10-CM

## 2011-12-06 DIAGNOSIS — K5730 Diverticulosis of large intestine without perforation or abscess without bleeding: Secondary | ICD-10-CM

## 2011-12-06 DIAGNOSIS — M545 Low back pain, unspecified: Secondary | ICD-10-CM

## 2011-12-06 DIAGNOSIS — E785 Hyperlipidemia, unspecified: Secondary | ICD-10-CM

## 2011-12-06 DIAGNOSIS — E538 Deficiency of other specified B group vitamins: Secondary | ICD-10-CM

## 2011-12-06 DIAGNOSIS — J449 Chronic obstructive pulmonary disease, unspecified: Secondary | ICD-10-CM

## 2011-12-06 DIAGNOSIS — Z87898 Personal history of other specified conditions: Secondary | ICD-10-CM

## 2011-12-06 DIAGNOSIS — F419 Anxiety disorder, unspecified: Secondary | ICD-10-CM

## 2011-12-06 DIAGNOSIS — D649 Anemia, unspecified: Secondary | ICD-10-CM

## 2011-12-06 DIAGNOSIS — M1990 Unspecified osteoarthritis, unspecified site: Secondary | ICD-10-CM

## 2011-12-06 DIAGNOSIS — J4489 Other specified chronic obstructive pulmonary disease: Secondary | ICD-10-CM

## 2011-12-06 LAB — HEPATIC FUNCTION PANEL
AST: 20 U/L (ref 0–37)
Albumin: 4.2 g/dL (ref 3.5–5.2)
Alkaline Phosphatase: 75 U/L (ref 39–117)
Total Protein: 7 g/dL (ref 6.0–8.3)

## 2011-12-06 LAB — LIPID PANEL: HDL: 37 mg/dL — ABNORMAL LOW (ref >39.00)

## 2011-12-06 LAB — BASIC METABOLIC PANEL
CO2: 28 mEq/L (ref 19–32)
Glucose, Bld: 108 mg/dL — ABNORMAL HIGH (ref 70–99)
Potassium: 5.4 mEq/L — ABNORMAL HIGH (ref 3.5–5.1)
Sodium: 141 mEq/L (ref 135–145)

## 2011-12-06 LAB — TSH: TSH: 1.85 u[IU]/mL (ref 0.35–5.50)

## 2011-12-06 LAB — CBC WITH DIFFERENTIAL/PLATELET
Basophils Absolute: 0.1 10*3/uL (ref 0.0–0.1)
Lymphocytes Relative: 25.6 % (ref 12.0–46.0)
Monocytes Relative: 7 % (ref 3.0–12.0)
Neutrophils Relative %: 64.1 % (ref 43.0–77.0)
Platelets: 160 10*3/uL (ref 150.0–400.0)
RDW: 13.9 % (ref 11.5–14.6)

## 2011-12-06 MED ORDER — CYANOCOBALAMIN 1000 MCG/ML IJ SOLN
1000.0000 ug | Freq: Once | INTRAMUSCULAR | Status: AC
  Administered 2011-12-06: 1000 ug via INTRAMUSCULAR

## 2011-12-06 NOTE — Progress Notes (Signed)
Subjective:     Patient ID: Joseph Carter, male   DOB: [birth year], 78 y.o.   MRN: [id number]  HPI 78 y/o WM here for a follow up visit... he has multiple medical problems as noted below...    ~  Jun10:  he reports having a good year- BP controlled on his home checks, getting B12 shots monthly, feeling well w/o new complanits or concerns... he exercises w/ yard work etc & no problems reported.  ~  December 06, 2010:  after Stace's last visit his PSA ret at 4.31 & he was referred to Urology & saw [facility] w/ the eventual Dx of Prostate Cancer (PSA up to 7.18 at that time) & Rx w/ brachytherapy & 3D radiation planning from DrManning... he last saw DrDavis for f/u 9/11 & he plans Q67mo evaluations- on [facility] 0.4mg /d & pt wants to stop this (asked to check w/ Urology)...    He has hx Asbestos exposure & prev VATS surg for cyst 1997> CXR is unchanged... BP controlled on meds... Chol looks good on diet alone...  his CC is his arthritis & he uses OTC meds- offer Rx but he doesn't want more meds if poss...  he remains on B12 shots w/ norm B12 level- notes energy "fair" & advised oral MVI etc... he refuses Flu vaccine.  ~  December 06, 2011:  Yearly ROV & Adarius continues to do well w/o new complaints or concerns> See Prob List below>>          Problem List:  COPD (ICD-496) - ex-smoker (quit~1980) w/ hx COPD... no active symptoms... doing satis, no meds, no recent exac- denies SOB, CP, edema, etc- min AM cough/ no phlegm/ no blood etc...  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 ~  CXR 1/12 showed s/p surg on right, chr changes, NAD.[name]. ~  CXR 1/13 showed clear lungs, post surg changes on right w/o interval change, NAD...  HYPERTENSION (ICD-401.9) - on ASA 81mg /d, NORVASC 5mg /d and DIOVAN 160mg /d...  ~  + FamHx CAD- Bro w/ CABG... ~  2DEcho 9/98 showed mild LVH, norm LVF, mild AI... ~  1/12:  BP= 144/82 but  all 130's/ 80's at home; denies HA, fatigue, visual changes, CP, palipit, dizziness, syncope, dyspnea, edema, etc... ~  1/13:  BP= 138/76 & he remains essentially asymptomatic...  Hx of PAC (ICD-427.61) - neg cardiac eval 1998... he takes ASA 81mg /d...  HYPERCHOLESTEROLEMIA, BORDERLINE (ICD-272.4) ~  FLP 9/07 showed TChol 161, TG 54, HDL 33, LDL 117... on diet + exercise Rx... ~  FLP 5/09 showed TChol 155, TG 73, HDL 28, LDL 112 ~  FLP 6/10 showed TChol 160, TG 82, HDL 34, LDL 110 ~  FLP 1/12 showed TChol 159, TG 86, HDL 33, LDL 109 ~  FLP 1/13 on diet alone showed TChol 171, TG 68, HDL 37, LDL 120  DIVERTICULAR DISEASE (ICD-562.10) - colonoscopy 4/03 by Dorris Singh showed divertics only... f/u rec 10 yrs. ~  1/13: f/u colonoscopy is due 4/13...  BENIGN PROSTATIC HYPERTROPHY, HX OF (ICD-V13.8) ADENOCARCINOMA, PROSTATE (ICD-185) HYPOGONADISM (ICD-257.2) - prev hx of intol to Doxazosin, therefore changed to [facility] 0.4mg /d but states no benefit to his decr stream so he stopped it; only mild LTOS & denies incontinence, freq, nocturia... ~  labs 4/08 showed PSA= 2.91 ~  labs 5/09 showed PSA= 3.58 ~  labs 6/10 showed PSA= 4.31 (note: Testosterone level= 255)... refer to  Urology, DrDavis ==> & eventual Dx Prostate Cancer & he decided on Brachytherapy per DrManning  (done 6/11)... ~  pt tells me he continues to follow up w/ DrDavis Q27mo but we don't have notes from him... ~  1/13: pt reports that DrDavis started [facility] & VESICARE and that the combination is helping his urination; last seen 9/12 & he reports that PSA was "ok"...  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  BACK PAIN, LUMBAR (ICD-724.2) - s/p L4-5 laminectomy and microdiscectomy 5/01 by [name]...  CERVICAL SPONDYLOSIS WITHOUT MYELOPATHY (ICD-721.0) - he also has known CSpine spondylosis and foramenal stenosis- followed by  [name]...  ANEMIA, PERNICIOUS (ICD-281.0) - on B 12 shots monthly... ~  labs 5/09 showed B12= 602 ~  labs 6/10 showed B12= >1500 ~  labs 1/12 showed Hg= 15.9, B12= 497 ~  Labs 1/13 showed Hg= 16.7, B12 level >1500 & he tells me he takes B12 tabs Bid as well (he likes it that way).   Past Surgical History  Procedure Date  . Appendectomy   . Surgery for removal of intercostal cyst 1997  . Lumbar laminectomy l4-5 2001    Dr. Phoebe Perch  . Cts surgery on right     Dr. Amanda Pea  . Prostate biopsy, then brachytherapy 04/2010    Dr. Earlene Plater    Outpatient Encounter Prescriptions as of 12/06/2011  Medication Sig Dispense Refill  . amLODipine (NORVASC) 5 MG tablet Take 5 mg by mouth daily.        [name] aspirin 81 MG tablet Take 160 mg by mouth daily.        . Multiple Vitamins-Minerals (CENTRUM SILVER PO) Take 1 tablet by mouth daily.        . solifenacin (VESICARE) 5 MG tablet Take 5 mg by mouth daily.        . Tamsulosin HCl (FLOMAX) 0.4 MG CAPS Take 0.4 mg by mouth daily after supper.        . valsartan (DIOVAN) 160 MG tablet Take 160 mg by mouth daily.        . vitamin B-12 (CYANOCOBALAMIN) 1000 MCG tablet Take 1,000 mcg by mouth daily.          Allergies  Allergen Reactions  . Lisinopril     REACTION: Hx of ACE cough...    Current Medications, Allergies, Past Medical History, Past Surgical History, Family History, and Social History were reviewed in [employer] record.     Review of Systems         The patient complains of malaise, dyspnea on exertion, urinary frequency, nocturia, joint pain, stiffness, and arthritis.  The patient denies fever, chills, sweats, anorexia, fatigue, weakness, weight loss, sleep disorder, blurring, diplopia, eye irritation, eye discharge, vision loss, eye pain, photophobia, earache, ear discharge, tinnitus, decreased hearing, nasal congestion, nosebleeds, sore throat, hoarseness, chest pain, palpitations, syncope, orthopnea, PND, peripheral  edema, cough, dyspnea at rest, excessive sputum, hemoptysis, wheezing, pleurisy, nausea, vomiting, diarrhea, constipation, change in bowel habits, abdominal pain, melena, hematochezia, jaundice, gas/bloating, indigestion/heartburn, dysphagia, odynophagia, dysuria, hematuria, urinary hesitancy, incontinence, back pain, joint swelling, muscle cramps, muscle weakness, restless legs, leg pain at night, leg pain with exertion, rash, itching, dryness, suspicious lesions, paralysis, paresthesias, seizures, tremors, vertigo, transient blindness, frequent falls, frequent headaches, difficulty walking, depression, anxiety, memory loss, confusion, cold intolerance, heat intolerance, polydipsia, polyphagia, polyuria, unusual weight change, abnormal bruising, bleeding, enlarged  lymph nodes, urticaria, allergic rash, hay fever, and recurrent infections.   Objective:   Physical Exam    WD, WN, 78 y/o WM in NAD... GENERAL:  Alert & oriented; pleasant & cooperative... HEENT:  [place]/AT, , EACs-clear, TMs-wnl, NOSE-clear, THROAT-clear & wnl. NECK:  Supple w/ fairROM; no JVD; normal carotid impulses w/o bruits; no thyromegaly or nodules palpated; no lymphadenopathy. CHEST:  Clear to P & A; without wheezes/ rales/ or rhonchi heard... HEART:  Regular Rhythm; without murmurs/ rubs/ or gallops detected... ABDOMEN:  Soft & nontender; normal bowel sounds; no organomegaly or masses palpated... EXT: without deformities, mild arthritic changes; no varicose veins/ venous insuffic/ or edema. NEURO:  CN's intact;  no focal neuro deficits... DERM:  No lesions noted; no rash etc...  RADIOLOGY DATA:  Reviewed in the EPIC EMR & discussed w/ the patient...    >>CXR showed clear lungs, post surg changes on right w/o interval change, NAD...  LABORATORY DATA:  Reviewed in the EPIC EMR & discussed w/ the patient...    >>Labs = all good on diet alone...   Assessment:     COPD, Hx Asbestos Exposure>  He quit smoking ~1980, CXR is  stable post op changes, NAD...  HBP>  Controlled on his 2 meds, continue same...  CHOL>  Controlled on diet alone...  GI> Divertics>  He is due for f/u colonoscopy 4/13 from [facility]...  GU> BPH, Prostate Cancer, Hypogonadism>  Followed by ZOXWRUEA at [facility], he does the PSAs; pt not on Testos replacement rx...  DJD, LBP, Cervical Spondylosis>  He manages quite well w/ OTC analgesics as needed...  Hx PA/ B12 Defic>  His B12 level is >1500 now but he doesn't want to back off on his B12 supplementation, believing thatit gives him energy...     Plan:     Patient's Medications  New Prescriptions   No medications on file  Previous Medications   AMLODIPINE (NORVASC) 5 MG TABLET    Take 5 mg by mouth daily.     ASPIRIN 81 MG TABLET    Take 160 mg by mouth daily.     MULTIPLE VITAMINS-MINERALS (CENTRUM SILVER PO)    Take 1 tablet by mouth daily.     SOLIFENACIN (VESICARE) 5 MG TABLET    Take 5 mg by mouth daily.     TAMSULOSIN HCL (FLOMAX) 0.4 MG CAPS    Take 0.4 mg by mouth daily after supper.     VALSARTAN (DIOVAN) 160 MG TABLET    Take 160 mg by mouth daily.     VITAMIN B-12 (CYANOCOBALAMIN) 1000 MCG TABLET    Take 1,000 mcg by mouth daily.    Modified Medications   No medications on file  Discontinued Medications   No medications on file

## 2011-12-06 NOTE — Patient Instructions (Signed)
Today we updated your med list in our EPIC system...    Continue your current medications the same...  Today we did your follow up CXR & fasting blood work...    Please call the PHONE TREE in a few days for your results...    Dial [id number] & when prompted enter your patient number followed by the # symbol...    Your patient number is:  [id number]#  Call for any questions...  Let's plan a follow up eval in 1 year's time, sooner if needed for any problems.Marland KitchenMarland Kitchen

## 2011-12-07 ENCOUNTER — Encounter: Payer: Self-pay | Admitting: Gastroenterology

## 2011-12-18 ENCOUNTER — Encounter: Payer: Self-pay | Admitting: Pulmonary Disease

## 2011-12-19 ENCOUNTER — Encounter: Payer: Self-pay | Admitting: Gastroenterology

## 2011-12-19 ENCOUNTER — Ambulatory Visit (INDEPENDENT_AMBULATORY_CARE_PROVIDER_SITE_OTHER): Payer: Medicare Other | Admitting: Gastroenterology

## 2011-12-19 VITALS — BP 140/70 | HR 92 | Ht 76.0 in | Wt 224.0 lb

## 2011-12-19 DIAGNOSIS — Z0000 Encounter for general adult medical examination without abnormal findings: Secondary | ICD-10-CM | POA: Insufficient documentation

## 2011-12-19 DIAGNOSIS — Z1211 Encounter for screening for malignant neoplasm of colon: Secondary | ICD-10-CM

## 2011-12-19 MED ORDER — PEG-KCL-NACL-NASULF-NA ASC-C 100 G PO SOLR: 1.0000 | Freq: Once | ORAL | Status: DC

## 2011-12-19 NOTE — Progress Notes (Signed)
History of Present Illness:  Mr. Joseph Carter  is a 78 year old white male referred at the request of Dr. Kriste Basque for screening colonoscopy.  Last colonoscopy 10 years ago demonstrated diverticulosis. Since his last visit he underwent radiation and seed implants for prostate cancer. He had problems with incontinence which has finally resolved.    Past Medical History  Diagnosis Date  . COPD (chronic obstructive pulmonary disease)   . Exposure to asbestos   . Hypertension   . PAC (premature atrial contraction)   . Hypercholesteremia   . Diverticular disease   . BPH (benign prostatic hyperplasia)   . Adenocarcinoma of prostate   . Hypogonadism male   . DJD (degenerative joint disease)   . Lumbar back pain   . Cervical spondylosis without myelopathy   . Pernicious anemia    Past Surgical History  Procedure Date  . Appendectomy   . Surgery for removal of intercostal cyst 1997  . Lumbar laminectomy l4-5 2001    Dr. Phoebe Perch  . Cts surgery on right     Dr. Amanda Pea  . Prostate biopsy, then brachytherapy 04/2010    Dr. Earlene Plater   family history includes Prostate cancer in his father. Current Outpatient Prescriptions  Medication Sig Dispense Refill  . amLODipine (NORVASC) 5 MG tablet Take 5 mg by mouth daily.        [name] aspirin 81 MG tablet Take 160 mg by mouth daily.        . Multiple Vitamins-Minerals (CENTRUM SILVER PO) Take 1 tablet by mouth daily.        . solifenacin (VESICARE) 5 MG tablet Take 5 mg by mouth daily.        . Tamsulosin HCl (FLOMAX) 0.4 MG CAPS Take 0.4 mg by mouth daily after supper.        . valsartan (DIOVAN) 160 MG tablet Take 160 mg by mouth daily.        . vitamin B-12 (CYANOCOBALAMIN) 1000 MCG tablet Take 1,000 mcg by mouth daily.         Allergies as of 12/19/2011 - Review Complete 12/19/2011  Allergen Reaction Noted  . Lisinopril      reports that he quit smoking about 42 years ago. He has never used smokeless tobacco. He reports that he drinks alcohol. He reports  that he does not use illicit drugs.     Review of Systems: Pertinent positive and negative review of systems were noted in the above HPI section. All other review of systems were otherwise negative.  Vital signs were reviewed in today's medical record Physical Exam: General: Well developed , well nourished, no acute distress Head: Normocephalic and atraumatic Eyes:  sclerae anicteric, EOMI Ears: Normal auditory acuity Mouth: No deformity or lesions Neck: Supple, no masses or thyromegaly Lungs: Clear throughout to auscultation Heart: Regular rate and rhythm; no murmurs, rubs or bruits Abdomen: Soft, non tender and non distended. No masses, hepatosplenomegaly or hernias noted. Normal Bowel sounds Rectal:deferred Musculoskeletal: Symmetrical with no gross deformities  Skin: No lesions on visible extremities Pulses:  Normal pulses noted Extremities: No clubbing, cyanosis, edema or deformities noted Neurological: Alert oriented x 4, grossly nonfocal Cervical Nodes:  No significant cervical adenopathy Inguinal Nodes: No significant inguinal adenopathy Psychological:  Alert and cooperative. Normal mood and affect

## 2011-12-19 NOTE — Assessment & Plan Note (Signed)
Plan screening colonoscopy 

## 2011-12-19 NOTE — Patient Instructions (Signed)
Your Colonoscopy is scheduled on 12/22/2011 at 2pm  You will pick up your MoviPrep kit from your pharmacy today Separate instructions have been given  Colonoscopy A colonoscopy is an exam to evaluate your entire colon. In this exam, your colon is cleansed. A long fiberoptic tube is inserted through your rectum and into your colon. The fiberoptic scope (endoscope) is a long bundle of enclosed and very flexible fibers. These fibers transmit light to the area examined and send images from that area to your caregiver. Discomfort is usually minimal. You may be given a drug to help you sleep (sedative) during or prior to the procedure. This exam helps to detect lumps (tumors), polyps, inflammation, and areas of bleeding. Your caregiver may also take a small piece of tissue (biopsy) that will be examined under a microscope. LET YOUR CAREGIVER KNOW ABOUT:   Allergies to food or medicine.   Medicines taken, including vitamins, herbs, eyedrops, over-the-counter medicines, and creams.   Use of steroids (by mouth or creams).   Previous problems with anesthetics or numbing medicines.   History of bleeding problems or blood clots.   Previous surgery.   Other health problems, including diabetes and kidney problems.   Possibility of pregnancy, if this applies.  BEFORE THE PROCEDURE   A clear liquid diet may be required for 2 days before the exam.   Ask your caregiver about changing or stopping your regular medications.   Liquid injections (enemas) or laxatives may be required.   A large amount of electrolyte solution may be given to you to drink over a short period of time. This solution is used to clean out your colon.   You should be present 60 minutes prior to your procedure or as directed by your caregiver.  AFTER THE PROCEDURE   If you received a sedative or pain relieving medication, you will need to arrange for someone to drive you home.   Occasionally, there is a little blood passed with  the first bowel movement. Do not be concerned.  FINDING OUT THE RESULTS OF YOUR TEST Not all test results are available during your visit. If your test results are not back during the visit, make an appointment with your caregiver to find out the results. Do not assume everything is normal if you have not heard from your caregiver or the medical facility. It is important for you to follow up on all of your test results. HOME CARE INSTRUCTIONS   It is not unusual to pass moderate amounts of gas and experience mild abdominal cramping following the procedure. This is due to air being used to inflate your colon during the exam. Walking or a warm pack on your belly (abdomen) may help.   You may resume all normal meals and activities after sedatives and medicines have worn off.   Only take over-the-counter or prescription medicines for pain, discomfort, or fever as directed by your caregiver. Do not use aspirin or blood thinners if a biopsy was taken. Consult your caregiver for medicine usage if biopsies were taken.  SEEK IMMEDIATE MEDICAL CARE IF:   You have a fever.   You pass large blood clots or fill a toilet with blood following the procedure. This may also occur 10 to 14 days following the procedure. This is more likely if a biopsy was taken.   You develop abdominal pain that keeps getting worse and cannot be relieved with medicine.  Document Released: 11/11/2000 Document Revised: 07/27/2011 Document Reviewed: 06/26/2008 [employer] Patient Information 2012  [employer].

## 2011-12-22 ENCOUNTER — Encounter: Payer: Self-pay | Admitting: Gastroenterology

## 2011-12-22 ENCOUNTER — Ambulatory Visit (AMBULATORY_SURGERY_CENTER): Payer: Medicare Other | Admitting: Gastroenterology

## 2011-12-22 DIAGNOSIS — Z1211 Encounter for screening for malignant neoplasm of colon: Secondary | ICD-10-CM

## 2011-12-22 DIAGNOSIS — K5730 Diverticulosis of large intestine without perforation or abscess without bleeding: Secondary | ICD-10-CM

## 2011-12-22 DIAGNOSIS — K627 Radiation proctitis: Secondary | ICD-10-CM

## 2011-12-22 DIAGNOSIS — D126 Benign neoplasm of colon, unspecified: Secondary | ICD-10-CM

## 2011-12-22 MED ORDER — SODIUM CHLORIDE 0.9 % IV SOLN: 500.0000 mL | INTRAVENOUS | Status: DC

## 2011-12-22 NOTE — Progress Notes (Signed)
Patient did not have preoperative order for IV antibiotic SSI prophylaxis. (G8918)  Patient did not experience any of the following events: a burn prior to discharge; a fall within the facility; wrong site/side/patient/procedure/implant event; or a hospital transfer or hospital admission upon discharge from the facility. (G8907)  

## 2011-12-22 NOTE — Op Note (Signed)
[facility] [address] [place], [place]  [address]  COLONOSCOPY PROCEDURE REPORT  PATIENT:  Joseph Carter, Joseph Carter  MR#:  [id number] BIRTHDATE:  1932-01-12, 79 yrs. old  GENDER:  male ENDOSCOPIST:  Barbette Hair. Arlyce Dice, MD REF. BY: PROCEDURE DATE:  12/22/2011 PROCEDURE:  Colonoscopy with snare polypectomy ASA CLASS:  Class II INDICATIONS:  Routine Risk Screening MEDICATIONS:   MAC sedation, administered by CRNA propofol 180mg IV  DESCRIPTION OF PROCEDURE:   After the risks benefits and alternatives of the procedure were thoroughly explained, informed consent was obtained.  Digital rectal exam was performed and revealed no abnormalities.   The LB [id number] endoscope was introduced through the anus and advanced to the cecum, which was identified by both the appendix and ileocecal valve, without limitations.  The quality of the prep was good, using MoviPrep. The instrument was then slowly withdrawn as the colon was fully examined. <<PROCEDUREIMAGES>>  FINDINGS:  A sessile polyp was found in the descending colon. It was 3 mm in size. Polyp was snared without cautery. Retrieval was successful (see image7). snare polyp  There were mucosal changes consistent with radiation proctitis seen in the rectum (see image1).  Moderate diverticulosis was found in the sigmoid colon (see image8).  Scattered diverticula were found in the ascending colon (see image2).  This was otherwise a normal examination of the colon (see image4 and image5).   Retroflexed views in the rectum revealed Unable to retroflex.  Because of broken scope  The time to cecum =  1) 2.25  minutes. The scope was then withdrawn in 1) 12.25  minutes from the cecum and the procedure completed. COMPLICATIONS:  None ENDOSCOPIC IMPRESSION: 1) 3 mm sessile polyp in the descending colon 2) Radiation proctitis 3) Moderate diverticulosis in the sigmoid colon 4) Diverticula, scattered in the ascending colon 5) Otherwise normal  examination 6) Unable to retroflex RECOMMENDATIONS: 1) Return to the care of your primary provider. GI follow up as needed 2) Given your age, you will not need another colonoscopy for colon cancer screening or polyp surveillance. These types of tests usually stop around the age 26. REPEAT EXAM:  No  ______________________________ Barbette Hair. Arlyce Dice, MD  CC:  Michele Mcalpine, MD  n. Rosalie Doctor:   Barbette Hair. Shahid Flori at 12/22/2011 02:59 PM  Christel Mormon, [phone]

## 2011-12-22 NOTE — Patient Instructions (Signed)
Read the handouts given to you by your recovery room nurse.   Try to increase the fiber in your diet due to your diverticulosis.  Resume your routine medications today.  Your polyp results will be mailed to you within two weeks.   IF you have any questions, call us at [phone]. Thank-you.

## 2011-12-23 ENCOUNTER — Telehealth: Payer: Self-pay | Admitting: *Deleted

## 2011-12-23 ENCOUNTER — Other Ambulatory Visit: Payer: Self-pay | Admitting: Radiation Oncology

## 2011-12-23 NOTE — Telephone Encounter (Signed)

## 2011-12-27 ENCOUNTER — Encounter: Payer: Self-pay | Admitting: Gastroenterology

## 2011-12-27 ENCOUNTER — Encounter: Payer: Self-pay | Admitting: *Deleted

## 2012-01-04 ENCOUNTER — Other Ambulatory Visit: Payer: Self-pay | Admitting: Pulmonary Disease

## 2012-01-06 ENCOUNTER — Ambulatory Visit (INDEPENDENT_AMBULATORY_CARE_PROVIDER_SITE_OTHER): Payer: Medicare Other

## 2012-01-06 DIAGNOSIS — D649 Anemia, unspecified: Secondary | ICD-10-CM

## 2012-01-06 MED ORDER — CYANOCOBALAMIN 1000 MCG/ML IJ SOLN
1000.0000 ug | Freq: Once | INTRAMUSCULAR | Status: AC
  Administered 2012-01-06: 1000 ug via INTRAMUSCULAR

## 2012-01-13 ENCOUNTER — Encounter: Payer: Self-pay | Admitting: Gastroenterology

## 2012-02-03 ENCOUNTER — Ambulatory Visit (INDEPENDENT_AMBULATORY_CARE_PROVIDER_SITE_OTHER): Payer: Medicare Other

## 2012-02-03 DIAGNOSIS — D649 Anemia, unspecified: Secondary | ICD-10-CM

## 2012-02-03 MED ORDER — CYANOCOBALAMIN 1000 MCG/ML IJ SOLN
1000.0000 ug | Freq: Once | INTRAMUSCULAR | Status: AC
  Administered 2012-02-03: 1000 ug via INTRAMUSCULAR

## 2012-02-24 ENCOUNTER — Other Ambulatory Visit: Payer: Self-pay | Admitting: Adult Health

## 2012-03-05 ENCOUNTER — Ambulatory Visit (INDEPENDENT_AMBULATORY_CARE_PROVIDER_SITE_OTHER): Payer: Medicare Other

## 2012-03-05 DIAGNOSIS — D649 Anemia, unspecified: Secondary | ICD-10-CM

## 2012-03-05 MED ORDER — CYANOCOBALAMIN 1000 MCG/ML IJ SOLN
1000.0000 ug | Freq: Once | INTRAMUSCULAR | Status: AC
  Administered 2012-03-05: 1000 ug via INTRAMUSCULAR

## 2012-03-12 ENCOUNTER — Other Ambulatory Visit: Payer: Self-pay | Admitting: Adult Health

## 2012-04-04 ENCOUNTER — Ambulatory Visit (INDEPENDENT_AMBULATORY_CARE_PROVIDER_SITE_OTHER): Payer: Medicare Other

## 2012-04-04 DIAGNOSIS — D649 Anemia, unspecified: Secondary | ICD-10-CM

## 2012-04-04 MED ORDER — CYANOCOBALAMIN 1000 MCG/ML IJ SOLN
1000.0000 ug | Freq: Once | INTRAMUSCULAR | Status: AC
  Administered 2012-04-04: 1000 ug via INTRAMUSCULAR

## 2012-05-07 ENCOUNTER — Ambulatory Visit (INDEPENDENT_AMBULATORY_CARE_PROVIDER_SITE_OTHER): Payer: Medicare Other

## 2012-05-07 DIAGNOSIS — D649 Anemia, unspecified: Secondary | ICD-10-CM

## 2012-05-07 MED ORDER — CYANOCOBALAMIN 1000 MCG/ML IJ SOLN
1000.0000 ug | Freq: Once | INTRAMUSCULAR | Status: AC
  Administered 2012-05-07: 1000 ug via INTRAMUSCULAR

## 2012-06-06 ENCOUNTER — Ambulatory Visit (INDEPENDENT_AMBULATORY_CARE_PROVIDER_SITE_OTHER): Payer: Medicare Other

## 2012-06-06 DIAGNOSIS — D649 Anemia, unspecified: Secondary | ICD-10-CM

## 2012-06-06 MED ORDER — CYANOCOBALAMIN 1000 MCG/ML IJ SOLN
1000.0000 ug | Freq: Once | INTRAMUSCULAR | Status: AC
  Administered 2012-06-06: 1000 ug via INTRAMUSCULAR

## 2012-07-04 ENCOUNTER — Ambulatory Visit (INDEPENDENT_AMBULATORY_CARE_PROVIDER_SITE_OTHER): Payer: Medicare Other

## 2012-07-04 DIAGNOSIS — D649 Anemia, unspecified: Secondary | ICD-10-CM

## 2012-07-04 MED ORDER — CYANOCOBALAMIN 1000 MCG/ML IJ SOLN
1000.0000 ug | Freq: Once | INTRAMUSCULAR | Status: AC
  Administered 2012-07-04: 1000 ug via INTRAMUSCULAR

## 2012-07-11 ENCOUNTER — Other Ambulatory Visit: Payer: Self-pay | Admitting: Radiation Oncology

## 2012-08-03 ENCOUNTER — Ambulatory Visit (INDEPENDENT_AMBULATORY_CARE_PROVIDER_SITE_OTHER): Payer: Medicare Other

## 2012-08-03 DIAGNOSIS — D649 Anemia, unspecified: Secondary | ICD-10-CM

## 2012-08-03 MED ORDER — CYANOCOBALAMIN 1000 MCG/ML IJ SOLN
1000.0000 ug | Freq: Once | INTRAMUSCULAR | Status: AC
  Administered 2012-08-03: 1000 ug via INTRAMUSCULAR

## 2012-09-03 ENCOUNTER — Ambulatory Visit (INDEPENDENT_AMBULATORY_CARE_PROVIDER_SITE_OTHER): Payer: Medicare Other

## 2012-09-03 DIAGNOSIS — D649 Anemia, unspecified: Secondary | ICD-10-CM

## 2012-09-04 MED ORDER — CYANOCOBALAMIN 1000 MCG/ML IJ SOLN
1000.0000 ug | Freq: Once | INTRAMUSCULAR | Status: AC
  Administered 2012-09-04: 1000 ug via INTRAMUSCULAR

## 2012-10-02 ENCOUNTER — Encounter: Payer: Self-pay | Admitting: *Deleted

## 2012-10-03 ENCOUNTER — Ambulatory Visit (INDEPENDENT_AMBULATORY_CARE_PROVIDER_SITE_OTHER): Payer: Medicare Other | Admitting: Pulmonary Disease

## 2012-10-03 ENCOUNTER — Ambulatory Visit: Payer: Medicare Other

## 2012-10-03 ENCOUNTER — Other Ambulatory Visit (INDEPENDENT_AMBULATORY_CARE_PROVIDER_SITE_OTHER): Payer: Medicare Other

## 2012-10-03 ENCOUNTER — Encounter: Payer: Self-pay | Admitting: Pulmonary Disease

## 2012-10-03 VITALS — BP 140/78 | HR 60 | Temp 97.8°F | Ht 76.0 in | Wt 207.6 lb

## 2012-10-03 DIAGNOSIS — M545 Low back pain, unspecified: Secondary | ICD-10-CM

## 2012-10-03 DIAGNOSIS — M47812 Spondylosis without myelopathy or radiculopathy, cervical region: Secondary | ICD-10-CM

## 2012-10-03 DIAGNOSIS — Z23 Encounter for immunization: Secondary | ICD-10-CM

## 2012-10-03 DIAGNOSIS — E785 Hyperlipidemia, unspecified: Secondary | ICD-10-CM

## 2012-10-03 DIAGNOSIS — C61 Malignant neoplasm of prostate: Secondary | ICD-10-CM

## 2012-10-03 DIAGNOSIS — D126 Benign neoplasm of colon, unspecified: Secondary | ICD-10-CM

## 2012-10-03 DIAGNOSIS — D510 Vitamin B12 deficiency anemia due to intrinsic factor deficiency: Secondary | ICD-10-CM

## 2012-10-03 DIAGNOSIS — K5730 Diverticulosis of large intestine without perforation or abscess without bleeding: Secondary | ICD-10-CM

## 2012-10-03 DIAGNOSIS — R1032 Left lower quadrant pain: Secondary | ICD-10-CM

## 2012-10-03 DIAGNOSIS — I10 Essential (primary) hypertension: Secondary | ICD-10-CM

## 2012-10-03 DIAGNOSIS — K635 Polyp of colon: Secondary | ICD-10-CM

## 2012-10-03 DIAGNOSIS — F419 Anxiety disorder, unspecified: Secondary | ICD-10-CM

## 2012-10-03 DIAGNOSIS — F411 Generalized anxiety disorder: Secondary | ICD-10-CM

## 2012-10-03 DIAGNOSIS — J4489 Other specified chronic obstructive pulmonary disease: Secondary | ICD-10-CM

## 2012-10-03 DIAGNOSIS — M1990 Unspecified osteoarthritis, unspecified site: Secondary | ICD-10-CM

## 2012-10-03 DIAGNOSIS — J449 Chronic obstructive pulmonary disease, unspecified: Secondary | ICD-10-CM

## 2012-10-03 LAB — BASIC METABOLIC PANEL
Chloride: 107 mEq/L (ref 96–112)
GFR: 49.58 mL/min — ABNORMAL LOW (ref >60.00)
Potassium: 5.5 mEq/L — ABNORMAL HIGH (ref 3.5–5.1)
Sodium: 141 mEq/L (ref 135–145)

## 2012-10-03 LAB — TSH: TSH: 1.84 u[IU]/mL (ref 0.35–5.50)

## 2012-10-03 LAB — CBC WITH DIFFERENTIAL/PLATELET
Eosinophils Absolute: 0.3 10*3/uL (ref 0.0–0.7)
Eosinophils Relative: 3.3 % (ref 0.0–5.0)
HCT: 45.6 % (ref 39.0–52.0)
Lymphs Abs: 2.3 10*3/uL (ref 0.7–4.0)
MCHC: 33.3 g/dL (ref 30.0–36.0)
MCV: 87.7 fl (ref 78.0–100.0)
Monocytes Absolute: 0.6 10*3/uL (ref 0.1–1.0)
Neutrophils Relative %: 65.2 % (ref 43.0–77.0)
Platelets: 236 10*3/uL (ref 150.0–400.0)

## 2012-10-03 LAB — URINALYSIS, ROUTINE W REFLEX MICROSCOPIC
Ketones, ur: NEGATIVE
Specific Gravity, Urine: 1.025 (ref 1.000–1.030)
Urine Glucose: NEGATIVE
Urobilinogen, UA: 0.2 (ref 0.0–1.0)

## 2012-10-03 LAB — HEPATIC FUNCTION PANEL
ALT: 13 U/L (ref 0–53)
AST: 16 U/L (ref 0–37)
Alkaline Phosphatase: 72 U/L (ref 39–117)
Bilirubin, Direct: 0.2 mg/dL (ref 0.0–0.3)
Total Bilirubin: 1 mg/dL (ref 0.3–1.2)

## 2012-10-03 MED ORDER — CYANOCOBALAMIN 1000 MCG/ML IJ SOLN
1000.0000 ug | Freq: Once | INTRAMUSCULAR | Status: AC
  Administered 2012-10-03: 1000 ug via INTRAMUSCULAR

## 2012-10-03 MED ORDER — TRAMADOL HCL 50 MG PO TABS: 50.0000 mg | ORAL_TABLET | Freq: Three times a day (TID) | ORAL | Status: DC | PRN

## 2012-10-03 NOTE — Patient Instructions (Addendum)
Today we updated your med list in our EPIC system...    Continue your current medications the same...  Today we did your follow up blood work...    We will sched a CT scan of your Abd&Pelvis for further eval of your LLQ pain...    We will call you w/ the results when avail...  In the meanwhile, you may use the new TRAMADOL 50mg  up to 3 times daily for any pain...  Be sure to stay regular in your bowel movements & take MIRALAX OTC daily if needed...  Call for any problems.Marland KitchenMarland Kitchen

## 2012-10-03 NOTE — Progress Notes (Signed)
Subjective:     Patient ID: Joseph Carter, male   DOB: January 06, 1932, 78 y.o.   MRN: [id number]  HPI 78 y/o WM here for a follow up visit... he has multiple medical problems as noted below...    ~  Jun10:  he reports having a good year- BP controlled on his home checks, getting B12 shots monthly, feeling well w/o new complanits or concerns... he exercises w/ yard work etc & no problems reported.  ~  December 06, 2010:  after Rashaud's last visit his PSA ret at 4.31 & he was referred to Urology & saw [facility] w/ the eventual Dx of Prostate Cancer (PSA up to 7.18 at that time) & Rx w/ brachytherapy & 3D radiation planning from DrManning... he last saw DrDavis for f/u 9/11 & he plans Q35mo evaluations- on [facility] 0.4mg /d & pt wants to stop this (asked to check w/ Urology)...    He has hx Asbestos exposure & prev VATS surg for cyst 1997> CXR is unchanged... BP controlled on meds... Chol looks good on diet alone...  his CC is his arthritis & he uses OTC meds- offer Rx but he doesn't want more meds if poss...  he remains on B12 shots w/ norm B12 level- notes energy "fair" & advised oral MVI etc... he refuses Flu vaccine.  ~  December 06, 2011:  Yearly ROV & Agasthya continues to do well w/o new complaints or concerns> See Prob List below>>  ~  October 03, 2012:  31mo ROV & Darrill presents w/ a 48mo hx of pain in the abd> initially seen at [facility]) & told he had gallstones (we don't have these records or the scan report); he recently saw [facility] for his regular Urology f/u & he rec that [facility] f/u w/ [name] because it sounded more like diverticulitis; pain has been occurring off&on since June, predom left sided (LUQ & flank), pains lasts several days & seems to resolve on it's own, then returns, denies n/v/d but has some constip & notes ?intermit relief w/ BM; pt had Colonoscopy by [facility] 1/13- 3mm polyp in desc colon (tubular adenoma), mod diverticulosis in sigmoid, 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  We reviewed the need for Labs & CT Abd&Pelvis- see below...    MildCOPD/ HxAsbestos exposure/ s/p resection of intercostal cyst 1997> breathing is good, no cough/ phlegm/ hemoptysis/ CP/ dyspnea/ etc...    HBP> controlled on Amlodip5, Diovan160; BP= 140/78 & denies CP, palpit, SOB, edema, etc...    Chol> on diet alone & labs 1/13 ok x LDL=120    Hx prostate cancer/ LUTS> s/p brachytherapy per [facility] 2011 & followed by Delorise Royals on Flomax0.4 & Vesicare5; pt tells me his PSA has been falling & when last measured by Urology ~0.3    DJD, LBP, Cx Spondylosis> all followed by [name]; he is very stoic & not taking pain meds...    PA> on B12 shots monthly... We reviewed prob list, meds, xrays and labs> see below for updates >>  LABS 10/03/12:  Chems- ok x BUN=25 Creat=1.5 K=5.5;  CBC- wnl;  TSH=1.84;  Sed=16;  UA= trace heme, few rbc/wbc/bact... CT Abd&Pelvis 10/05/12 showed several distal left ureteral calculi- 2cm above UVJ, largest 5.18mm size, no hydronephrosis therefore NON-obstructing at present;  Mult other findings on the CT in this 78 y/o man> basilar atx/ scarring, coronary & abd ao/ iliac calcif, bilat renal cysts, mult layering  gallstones, 1.7cm right adrenal adenoma, sigmoid diverticulosis w/o inflamm, intraprostatic therapy seeds, no adenopathy, sm fat containing LIH, degen changes in spine...           Problem List:  COPD (ICD-496) - ex-smoker (quit~1980) w/ hx COPD... no active symptoms... doing satis, no meds, no recent exac- denies SOB, CP, edema, etc- min AM cough/ no phlegm/ no blood etc...  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 ~  CXR 1/12 showed s/p surg on right, chr changes, NAD.[name]. ~  CXR 1/13 showed clear lungs, post surg changes on right w/o interval change, NAD...  HYPERTENSION (ICD-401.9) - on ASA  81mg /d, NORVASC 5mg /d and DIOVAN 160mg /d...  ~  + FamHx CAD- Bro w/ CABG... ~  2DEcho 9/98 showed mild LVH, norm LVF, mild AI... ~  1/12:  BP= 144/82 but all 130's/ 80's at home; denies HA, fatigue, visual changes, CP, palipit, dizziness, syncope, dyspnea, edema, etc... ~  1/13:  BP= 138/76 & he remains essentially asymptomatic...  Hx of PAC (ICD-427.61) - neg cardiac eval 1998... he takes ASA 81mg /d...  HYPERCHOLESTEROLEMIA, BORDERLINE (ICD-272.4) ~  FLP 9/07 showed TChol 161, TG 54, HDL 33, LDL 117... on diet + exercise Rx... ~  FLP 5/09 showed TChol 155, TG 73, HDL 28, LDL 112 ~  FLP 6/10 showed TChol 160, TG 82, HDL 34, LDL 110 ~  FLP 1/12 showed TChol 159, TG 86, HDL 33, LDL 109 ~  FLP 1/13 on diet alone showed TChol 171, TG 68, HDL 37, LDL 120  DIVERTICULAR DISEASE (ICD-562.10) - colonoscopy 4/03 by Dorris Singh showed divertics only... f/u rec 10 yrs. ~  1/13: f/u colonoscopy is due 4/13...  BENIGN PROSTATIC HYPERTROPHY, HX OF (ICD-V13.8) ADENOCARCINOMA, PROSTATE (ICD-185) HYPOGONADISM (ICD-257.2) - prev hx of intol to Doxazosin, therefore changed to FLOMAX 0.4mg /d but states no benefit to his decr stream so he stopped it; only mild LTOS & denies incontinence, freq, nocturia... ~  labs 4/08 showed PSA= 2.91 ~  labs 5/09 showed PSA= 3.58 ~  labs 6/10 showed PSA= 4.31 (note: Testosterone level= 255)... refer to Urology, DrDavis ==> & eventual Dx Prostate Cancer & he decided on Brachytherapy per DrManning  (done 6/11)... ~  pt tells me he continues to follow up w/ DrDavis Q153mo but we don't have notes from him... ~  1/13: pt reports that DrDavis started [facility] & VESICARE and that the combination is helping his urination; last seen 9/12 & he reports that PSA was "ok"... ~  11/13:  Pt reports that his PSA is down to 0.3 per Urology; NOTE> labs here showed BUN=25 Creat=1.5 K=5.5   Left URETERAL STONES/  Bilat RENAL CYSTS >> ~  11/13:  Pt presented w/ 53mo hx intermittent left sided abd /  flank pain & eval from [facility] showing gallstones; DrDavis referred pt here for further eval of what sounded like diverticulitis but CT scan showed several distal ureteral stones ~2cm above the UVJ w/o hydronephrosis suggesting non-obstructive stones (largest= 5.5mm)- see above for mult other CT findings including bilat renal cysts, right adrenal adenoma, & mult sm layering gallstones;  We increased his Flomax0.4mg - take 2Qhs for medical expulsive therapy & referred him back to Urology...  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  BACK PAIN, LUMBAR (ICD-724.2) - s/p L4-5 laminectomy and microdiscectomy  5/01 by [name]...  CERVICAL SPONDYLOSIS WITHOUT MYELOPATHY (ICD-721.0) - he also has known CSpine spondylosis and foramenal stenosis- followed by [name]...  ANEMIA, PERNICIOUS (ICD-281.0) - on B 12 shots monthly... ~  labs 5/09 showed B12= 602 ~  labs 6/10 showed B12= >1500 ~  labs 1/12 showed Hg= 15.9, B12= 497 ~  Labs 1/13 showed Hg= 16.7, B12 level >1500 & he tells me he takes B12 tabs Bid as well (he likes it that way). ~  Labs 11/13 showed Hg= 15.2   Past Surgical History  Procedure Date  . Appendectomy   . Surgery for removal of intercostal cyst 1997  . Lumbar laminectomy l4-5 2001    Dr. Phoebe Perch  . Cts surgery on right     Dr. Amanda Pea  . Prostate biopsy, then brachytherapy 04/2010    Dr. Earlene Plater    Outpatient Encounter Prescriptions as of 10/03/2012  Medication Sig Dispense Refill  . amLODipine (NORVASC) 5 MG tablet take 1 tablet by mouth once daily  30 tablet  12  . aspirin 81 MG tablet Take 160 mg by mouth daily.        [name] DIOVAN 160 MG tablet TAKE 1 TABLET BY MOUTH ONCE DAILY  30 tablet  12  . Multiple Vitamins-Minerals (CENTRUM SILVER PO) Take 1 tablet by mouth daily.        . solifenacin (VESICARE) 5 MG tablet Take 5 mg by mouth daily.        . Tamsulosin HCl  (FLOMAX) 0.4 MG CAPS take 1 capsule by mouth at bedtime  30 capsule  5  . vitamin B-12 (CYANOCOBALAMIN) 1000 MCG tablet Take 1,000 mcg by mouth daily.        . [DISCONTINUED] DIOVAN 160 MG tablet TAKE 1 TABLET BY MOUTH ONCE DAILY  30 tablet  12    Allergies  Allergen Reactions  . Lisinopril     REACTION: Hx of ACE cough...    Current Medications, Allergies, Past Medical History, Past Surgical History, Family History, and Social History were reviewed in [employer] record.     Review of Systems         The patient complains of abd discomfort, malaise, dyspnea on exertion, urinary frequency, nocturia, joint pain, stiffness, and arthritis.  The patient denies fever, chills, sweats, anorexia, fatigue, weakness, weight loss, sleep disorder, blurring, diplopia, eye irritation, eye discharge, vision loss, eye pain, photophobia, earache, ear discharge, tinnitus, decreased hearing, nasal congestion, nosebleeds, sore throat, hoarseness, chest pain, palpitations, syncope, orthopnea, PND, peripheral edema, cough, dyspnea at rest, excessive sputum, hemoptysis, wheezing, pleurisy, nausea, vomiting, diarrhea, change in bowel habits, melena, hematochezia, jaundice, gas/bloating, indigestion/ heartburn, dysphagia, odynophagia, dysuria, hematuria, urinary hesitancy, incontinence, joint swelling, muscle cramps, muscle weakness, restless legs, leg pain at night, leg pain with exertion, rash, itching, dryness, suspicious lesions, paralysis, paresthesias, seizures, tremors, vertigo, transient blindness, frequent falls, frequent headaches, difficulty walking, depression, anxiety, memory loss, confusion, cold intolerance, heat intolerance, polydipsia, polyphagia, polyuria, unusual weight change, abnormal bruising, bleeding, enlarged lymph nodes, urticaria, allergic rash, hay fever, and recurrent infections.   Objective:   Physical Exam    WD, WN, 78 y/o WM in NAD... GENERAL:  Alert & oriented;  pleasant & cooperative... HEENT:  [place]/AT, , EACs-clear, TMs-wnl, NOSE-clear, THROAT-clear & wnl. NECK:  Supple w/ fairROM; no JVD; normal carotid impulses w/o bruits; no thyromegaly or nodules palpated; no lymphadenopathy. CHEST:  Clear to P & A; without wheezes/ rales/ or rhonchi heard... HEART:  Regular Rhythm; without murmurs/ rubs/  or gallops detected... ABDOMEN:  Soft & nontender; min discomfort in LLQ; normal bowel sounds; no organomegaly or masses palpated... EXT: without deformities, mild arthritic changes; no varicose veins/ venous insuffic/ or edema. NEURO:  CN's intact;  no focal neuro deficits... DERM:  No lesions noted; no rash etc...  RADIOLOGY DATA:  Reviewed in the EPIC EMR & discussed w/ the patient...  LABORATORY DATA:  Reviewed in the EPIC EMR & discussed w/ the patient...   Assessment:      GI/ GU>  Left sided abdominal & flank pain w/ waxing & waning pattern;  CT w/ evid of several distal left ureteral stones but no hydronephrosis at present & I wonder if intermittently obstruction to produce his pain pattern;  No prev hx kidney stones- asked to incr fluid intake & strain urine, incr Flomax0.4mg  to 2Qhs for medical expulsive therapy, and f/u w/ Urology...   COPD, Hx Asbestos Exposure>  He quit smoking ~1980, CXR is stable post op changes, NAD...  HBP>  Controlled on his 2 meds, continue same...  CHOL>  Controlled on diet alone...  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  GU> BPH, Prostate Cancer, Hypogonadism>  Followed by ZOXWRUEA at [facility] on Flomax & Vesicare, he does the PSAs; pt not on Testos replacement rx...  DJD, LBP, Cervical Spondylosis>  He manages quite well w/ OTC analgesics as needed...  Hx PA/ B12 Defic>  His B12 level is >1500 now but he doesn't want to back off on his B12 supplementation, believing  thatit gives him energy...     Plan:     Patient's Medications  New Prescriptions   TRAMADOL (ULTRAM) 50 MG TABLET    Take 1 tablet (50 mg total) by mouth 3 (three) times daily as needed for pain.  Previous Medications   AMLODIPINE (NORVASC) 5 MG TABLET    take 1 tablet by mouth once daily   ASPIRIN 81 MG TABLET    Take 160 mg by mouth daily.     DIOVAN 160 MG TABLET    TAKE 1 TABLET BY MOUTH ONCE DAILY   MULTIPLE VITAMINS-MINERALS (CENTRUM SILVER PO)    Take 1 tablet by mouth daily.     SOLIFENACIN (VESICARE) 5 MG TABLET    Take 5 mg by mouth daily.     TAMSULOSIN HCL (FLOMAX) 0.4 MG CAPS    take 1 capsule by mouth at bedtime   VITAMIN B-12 (CYANOCOBALAMIN) 1000 MCG TABLET    Take 1,000 mcg by mouth daily.    Modified Medications   No medications on file  Discontinued Medications   DIOVAN 160 MG TABLET    TAKE 1 TABLET BY MOUTH ONCE DAILY

## 2012-10-05 ENCOUNTER — Ambulatory Visit (INDEPENDENT_AMBULATORY_CARE_PROVIDER_SITE_OTHER)
Admission: RE | Admit: 2012-10-05 | Discharge: 2012-10-05 | Disposition: A | Discharge status: Home / Self Care | Payer: Medicare Other | Source: Ambulatory Visit | Attending: Pulmonary Disease | Admitting: Pulmonary Disease

## 2012-10-05 ENCOUNTER — Telehealth: Payer: Self-pay | Admitting: Pulmonary Disease

## 2012-10-05 DIAGNOSIS — R1032 Left lower quadrant pain: Secondary | ICD-10-CM

## 2012-10-05 MED ORDER — IOHEXOL 300 MG/ML  SOLN
100.0000 mL | Freq: Once | INTRAMUSCULAR | Status: AC | PRN
  Administered 2012-10-05: 100 mL via INTRAVENOUS

## 2012-10-05 NOTE — Telephone Encounter (Signed)
Will print this out for SN to  Be aware.

## 2012-11-02 ENCOUNTER — Ambulatory Visit (INDEPENDENT_AMBULATORY_CARE_PROVIDER_SITE_OTHER): Payer: Medicare Other

## 2012-11-02 DIAGNOSIS — D649 Anemia, unspecified: Secondary | ICD-10-CM

## 2012-11-08 DIAGNOSIS — D649 Anemia, unspecified: Secondary | ICD-10-CM

## 2012-11-08 MED ORDER — CYANOCOBALAMIN 1000 MCG/ML IJ SOLN
1000.0000 ug | Freq: Once | INTRAMUSCULAR | Status: AC
  Administered 2012-11-08: 1000 ug via INTRAMUSCULAR

## 2012-12-03 ENCOUNTER — Ambulatory Visit (INDEPENDENT_AMBULATORY_CARE_PROVIDER_SITE_OTHER): Payer: Medicare Other

## 2012-12-03 DIAGNOSIS — D510 Vitamin B12 deficiency anemia due to intrinsic factor deficiency: Secondary | ICD-10-CM

## 2012-12-03 MED ORDER — CYANOCOBALAMIN 1000 MCG/ML IJ SOLN
1000.0000 ug | Freq: Once | INTRAMUSCULAR | Status: AC
  Administered 2012-12-03: 1000 ug via INTRAMUSCULAR

## 2012-12-05 ENCOUNTER — Ambulatory Visit: Payer: Medicare Other | Admitting: Pulmonary Disease

## 2013-01-02 ENCOUNTER — Ambulatory Visit (INDEPENDENT_AMBULATORY_CARE_PROVIDER_SITE_OTHER): Payer: Medicare Other

## 2013-01-02 DIAGNOSIS — D649 Anemia, unspecified: Secondary | ICD-10-CM

## 2013-01-04 MED ORDER — CYANOCOBALAMIN 1000 MCG/ML IJ SOLN
1000.0000 ug | Freq: Once | INTRAMUSCULAR | Status: AC
  Administered 2013-01-04: 1000 ug via INTRAMUSCULAR

## 2013-01-17 ENCOUNTER — Other Ambulatory Visit: Payer: Self-pay | Admitting: Radiation Oncology

## 2013-01-23 ENCOUNTER — Other Ambulatory Visit: Payer: Self-pay | Admitting: Pulmonary Disease

## 2013-02-01 ENCOUNTER — Ambulatory Visit: Payer: Medicare Other

## 2013-02-04 ENCOUNTER — Ambulatory Visit (INDEPENDENT_AMBULATORY_CARE_PROVIDER_SITE_OTHER): Payer: Medicare Other

## 2013-02-04 DIAGNOSIS — D649 Anemia, unspecified: Secondary | ICD-10-CM

## 2013-02-06 MED ORDER — CYANOCOBALAMIN 1000 MCG/ML IJ SOLN
1000.0000 ug | Freq: Once | INTRAMUSCULAR | Status: AC
  Administered 2013-02-06: 1000 ug via INTRAMUSCULAR

## 2013-03-04 ENCOUNTER — Ambulatory Visit (INDEPENDENT_AMBULATORY_CARE_PROVIDER_SITE_OTHER): Payer: Medicare Other

## 2013-03-04 DIAGNOSIS — D649 Anemia, unspecified: Secondary | ICD-10-CM

## 2013-03-04 MED ORDER — CYANOCOBALAMIN 1000 MCG/ML IJ SOLN
1000.0000 ug | Freq: Once | INTRAMUSCULAR | Status: AC
  Administered 2013-03-04: 1000 ug via INTRAMUSCULAR

## 2013-03-21 ENCOUNTER — Other Ambulatory Visit: Payer: Self-pay | Admitting: Pulmonary Disease

## 2013-04-01 ENCOUNTER — Ambulatory Visit (INDEPENDENT_AMBULATORY_CARE_PROVIDER_SITE_OTHER): Payer: Medicare Other

## 2013-04-01 DIAGNOSIS — D649 Anemia, unspecified: Secondary | ICD-10-CM

## 2013-04-02 DIAGNOSIS — D649 Anemia, unspecified: Secondary | ICD-10-CM

## 2013-04-02 MED ORDER — CYANOCOBALAMIN 1000 MCG/ML IJ SOLN
1000.0000 ug | Freq: Once | INTRAMUSCULAR | Status: AC
  Administered 2013-04-02: 1000 ug via INTRAMUSCULAR

## 2013-05-06 ENCOUNTER — Ambulatory Visit (INDEPENDENT_AMBULATORY_CARE_PROVIDER_SITE_OTHER): Payer: Medicare Other

## 2013-05-06 DIAGNOSIS — D649 Anemia, unspecified: Secondary | ICD-10-CM

## 2013-05-08 MED ORDER — CYANOCOBALAMIN 1000 MCG/ML IJ SOLN
1000.0000 ug | Freq: Once | INTRAMUSCULAR | Status: AC
  Administered 2013-05-08: 1000 ug via INTRAMUSCULAR

## 2013-06-04 ENCOUNTER — Ambulatory Visit (INDEPENDENT_AMBULATORY_CARE_PROVIDER_SITE_OTHER): Payer: Medicare Other

## 2013-06-04 DIAGNOSIS — D649 Anemia, unspecified: Secondary | ICD-10-CM

## 2013-06-05 MED ORDER — CYANOCOBALAMIN 1000 MCG/ML IJ SOLN
1000.0000 ug | Freq: Once | INTRAMUSCULAR | Status: AC
  Administered 2013-06-05: 1000 ug via INTRAMUSCULAR

## 2013-07-05 ENCOUNTER — Ambulatory Visit (INDEPENDENT_AMBULATORY_CARE_PROVIDER_SITE_OTHER): Payer: Medicare Other

## 2013-07-05 DIAGNOSIS — D649 Anemia, unspecified: Secondary | ICD-10-CM

## 2013-07-08 MED ORDER — CYANOCOBALAMIN 1000 MCG/ML IJ SOLN
1000.0000 ug | Freq: Once | INTRAMUSCULAR | Status: AC
  Administered 2013-07-08: 1000 ug via INTRAMUSCULAR

## 2013-07-19 ENCOUNTER — Other Ambulatory Visit: Payer: Self-pay | Admitting: Radiation Oncology

## 2013-08-06 ENCOUNTER — Ambulatory Visit (INDEPENDENT_AMBULATORY_CARE_PROVIDER_SITE_OTHER): Payer: Medicare Other

## 2013-08-06 DIAGNOSIS — D510 Vitamin B12 deficiency anemia due to intrinsic factor deficiency: Secondary | ICD-10-CM

## 2013-08-07 MED ORDER — CYANOCOBALAMIN 1000 MCG/ML IJ SOLN
1000.0000 ug | Freq: Once | INTRAMUSCULAR | Status: AC
  Administered 2013-08-07: 1000 ug via INTRAMUSCULAR

## 2013-09-09 ENCOUNTER — Ambulatory Visit (INDEPENDENT_AMBULATORY_CARE_PROVIDER_SITE_OTHER): Payer: Medicare Other

## 2013-09-09 DIAGNOSIS — D510 Vitamin B12 deficiency anemia due to intrinsic factor deficiency: Secondary | ICD-10-CM

## 2013-09-09 DIAGNOSIS — Z23 Encounter for immunization: Secondary | ICD-10-CM

## 2013-09-10 MED ORDER — CYANOCOBALAMIN 1000 MCG/ML IJ SOLN
1000.0000 ug | Freq: Once | INTRAMUSCULAR | Status: AC
  Administered 2013-09-10: 1000 ug via INTRAMUSCULAR

## 2013-10-10 ENCOUNTER — Ambulatory Visit: Payer: Medicare Other

## 2013-10-10 ENCOUNTER — Ambulatory Visit (INDEPENDENT_AMBULATORY_CARE_PROVIDER_SITE_OTHER): Payer: Medicare Other

## 2013-10-10 DIAGNOSIS — D510 Vitamin B12 deficiency anemia due to intrinsic factor deficiency: Secondary | ICD-10-CM

## 2013-10-11 MED ORDER — CYANOCOBALAMIN 1000 MCG/ML IJ SOLN
1000.0000 ug | Freq: Once | INTRAMUSCULAR | Status: AC
  Administered 2013-10-11: 1000 ug via INTRAMUSCULAR

## 2013-11-11 ENCOUNTER — Ambulatory Visit (INDEPENDENT_AMBULATORY_CARE_PROVIDER_SITE_OTHER): Payer: Medicare Other

## 2013-11-11 DIAGNOSIS — D510 Vitamin B12 deficiency anemia due to intrinsic factor deficiency: Secondary | ICD-10-CM

## 2013-11-11 MED ORDER — CYANOCOBALAMIN 1000 MCG/ML IJ SOLN
1000.0000 ug | Freq: Once | INTRAMUSCULAR | Status: AC
  Administered 2013-11-11: 1000 ug via INTRAMUSCULAR

## 2013-12-13 ENCOUNTER — Ambulatory Visit (INDEPENDENT_AMBULATORY_CARE_PROVIDER_SITE_OTHER): Payer: Medicare Other

## 2013-12-13 DIAGNOSIS — D510 Vitamin B12 deficiency anemia due to intrinsic factor deficiency: Secondary | ICD-10-CM

## 2013-12-13 MED ORDER — CYANOCOBALAMIN 1000 MCG/ML IJ SOLN
1000.0000 ug | Freq: Once | INTRAMUSCULAR | Status: AC
  Administered 2013-12-13: 1000 ug via INTRAMUSCULAR

## 2014-01-02 ENCOUNTER — Ambulatory Visit (INDEPENDENT_AMBULATORY_CARE_PROVIDER_SITE_OTHER): Payer: Medicare Other | Admitting: Pulmonary Disease

## 2014-01-02 ENCOUNTER — Ambulatory Visit (INDEPENDENT_AMBULATORY_CARE_PROVIDER_SITE_OTHER)
Admission: RE | Admit: 2014-01-02 | Discharge: 2014-01-02 | Disposition: A | Discharge status: Home / Self Care | Payer: Medicare Other | Source: Ambulatory Visit | Attending: Pulmonary Disease | Admitting: Pulmonary Disease

## 2014-01-02 ENCOUNTER — Encounter: Payer: Self-pay | Admitting: Pulmonary Disease

## 2014-01-02 ENCOUNTER — Other Ambulatory Visit (INDEPENDENT_AMBULATORY_CARE_PROVIDER_SITE_OTHER): Payer: Medicare Other

## 2014-01-02 VITALS — BP 148/60 | HR 73 | Temp 97.0°F | Ht 76.0 in | Wt 216.8 lb

## 2014-01-02 DIAGNOSIS — E538 Deficiency of other specified B group vitamins: Secondary | ICD-10-CM

## 2014-01-02 DIAGNOSIS — K635 Polyp of colon: Secondary | ICD-10-CM

## 2014-01-02 DIAGNOSIS — E785 Hyperlipidemia, unspecified: Secondary | ICD-10-CM

## 2014-01-02 DIAGNOSIS — N200 Calculus of kidney: Secondary | ICD-10-CM | POA: Insufficient documentation

## 2014-01-02 DIAGNOSIS — I10 Essential (primary) hypertension: Secondary | ICD-10-CM

## 2014-01-02 DIAGNOSIS — F411 Generalized anxiety disorder: Secondary | ICD-10-CM

## 2014-01-02 DIAGNOSIS — K5730 Diverticulosis of large intestine without perforation or abscess without bleeding: Secondary | ICD-10-CM

## 2014-01-02 DIAGNOSIS — C61 Malignant neoplasm of prostate: Secondary | ICD-10-CM

## 2014-01-02 DIAGNOSIS — F419 Anxiety disorder, unspecified: Secondary | ICD-10-CM

## 2014-01-02 DIAGNOSIS — D510 Vitamin B12 deficiency anemia due to intrinsic factor deficiency: Secondary | ICD-10-CM

## 2014-01-02 DIAGNOSIS — D126 Benign neoplasm of colon, unspecified: Secondary | ICD-10-CM

## 2014-01-02 DIAGNOSIS — M47812 Spondylosis without myelopathy or radiculopathy, cervical region: Secondary | ICD-10-CM

## 2014-01-02 DIAGNOSIS — M1990 Unspecified osteoarthritis, unspecified site: Secondary | ICD-10-CM

## 2014-01-02 DIAGNOSIS — R269 Unspecified abnormalities of gait and mobility: Secondary | ICD-10-CM

## 2014-01-02 DIAGNOSIS — M545 Low back pain, unspecified: Secondary | ICD-10-CM

## 2014-01-02 DIAGNOSIS — Z77090 Contact with and (suspected) exposure to asbestos: Secondary | ICD-10-CM

## 2014-01-02 DIAGNOSIS — J449 Chronic obstructive pulmonary disease, unspecified: Secondary | ICD-10-CM

## 2014-01-02 DIAGNOSIS — Z87898 Personal history of other specified conditions: Secondary | ICD-10-CM

## 2014-01-02 DIAGNOSIS — I491 Atrial premature depolarization: Secondary | ICD-10-CM

## 2014-01-02 DIAGNOSIS — J4489 Other specified chronic obstructive pulmonary disease: Secondary | ICD-10-CM

## 2014-01-02 LAB — CBC WITH DIFFERENTIAL/PLATELET
BASOS ABS: 0 10*3/uL (ref 0.0–0.1)
BASOS PCT: 0.5 % (ref 0.0–3.0)
EOS ABS: 0.4 10*3/uL (ref 0.0–0.7)
Eosinophils Relative: 3.9 % (ref 0.0–5.0)
HEMATOCRIT: 49.1 % (ref 39.0–52.0)
HEMOGLOBIN: 16.4 g/dL (ref 13.0–17.0)
LYMPHS ABS: 2 10*3/uL (ref 0.7–4.0)
Lymphocytes Relative: 22 % (ref 12.0–46.0)
MCHC: 33.5 g/dL (ref 30.0–36.0)
MCV: 88.1 fl (ref 78.0–100.0)
MONO ABS: 0.6 10*3/uL (ref 0.1–1.0)
Monocytes Relative: 6.1 % (ref 3.0–12.0)
NEUTROS ABS: 6.2 10*3/uL (ref 1.4–7.7)
Neutrophils Relative %: 67.5 % (ref 43.0–77.0)
Platelets: 204 10*3/uL (ref 150.0–400.0)
RBC: 5.57 Mil/uL (ref 4.22–5.81)
RDW: 13.3 % (ref 11.5–14.6)
WBC: 9.2 10*3/uL (ref 4.5–10.5)

## 2014-01-02 LAB — BASIC METABOLIC PANEL
BUN: 23 mg/dL (ref 6–23)
CALCIUM: 10.7 mg/dL — AB (ref 8.4–10.5)
CO2: 23 meq/L (ref 19–32)
Chloride: 108 mEq/L (ref 96–112)
Creatinine, Ser: 1.4 mg/dL (ref 0.4–1.5)
GFR: 51.87 mL/min — ABNORMAL LOW (ref >60.00)
Glucose, Bld: 111 mg/dL — ABNORMAL HIGH (ref 70–99)
Potassium: 4.6 mEq/L (ref 3.5–5.1)
SODIUM: 139 meq/L (ref 135–145)

## 2014-01-02 LAB — HEPATIC FUNCTION PANEL
ALT: 14 U/L (ref 0–53)
AST: 18 U/L (ref 0–37)
Albumin: 4.3 g/dL (ref 3.5–5.2)
Alkaline Phosphatase: 76 U/L (ref 39–117)
BILIRUBIN DIRECT: 0.2 mg/dL (ref 0.0–0.3)
BILIRUBIN TOTAL: 1.7 mg/dL — AB (ref 0.3–1.2)
Total Protein: 7.6 g/dL (ref 6.0–8.3)

## 2014-01-02 LAB — PSA: PSA: 0.13 ng/mL (ref 0.10–4.00)

## 2014-01-02 LAB — TSH: TSH: 2.46 u[IU]/mL (ref 0.35–5.50)

## 2014-01-02 LAB — LIPID PANEL
CHOL/HDL RATIO: 5
Cholesterol: 177 mg/dL (ref 0–200)
HDL: 35.4 mg/dL — AB (ref >39.00)
LDL Cholesterol: 122 mg/dL — ABNORMAL HIGH (ref 0–99)
Triglycerides: 100 mg/dL (ref 0.0–149.0)
VLDL: 20 mg/dL (ref 0.0–40.0)

## 2014-01-02 LAB — VITAMIN B12: Vitamin B-12: 765 pg/mL (ref 211–911)

## 2014-01-02 MED ORDER — FLUOCINONIDE-E 0.05 % EX CREA: 1.0000 "application " | TOPICAL_CREAM | Freq: Two times a day (BID) | CUTANEOUS | Status: DC

## 2014-01-02 MED ORDER — VALSARTAN 160 MG PO TABS: ORAL_TABLET | ORAL | Status: DC

## 2014-01-02 MED ORDER — TAMSULOSIN HCL 0.4 MG PO CAPS: ORAL_CAPSULE | ORAL | Status: DC

## 2014-01-02 MED ORDER — AMLODIPINE BESYLATE 5 MG PO TABS: ORAL_TABLET | ORAL | Status: DC

## 2014-01-02 NOTE — Patient Instructions (Signed)
Today we updated your med list in our EPIC system...    Continue your current medications the same...    We refilled your meds per request...  We wrote a new prescription for LIDEX-E cream to apply to the ankle rash twice daily as needed...  Continue your monthly B12 shots...  Today we did your follow up CXR, EKG, & FASTING blood work...    We will contact you w/ the results when available...   We also filled out an application for a handicap sticker...  Call for any questions.Marland KitchenMarland Kitchen

## 2014-01-02 NOTE — Progress Notes (Signed)
Subjective:     Patient ID: Joseph Carter, male   DOB: February 09, 1932, 80 y.o.   MRN: [id number]  HPI 80 y/o WM here for a follow up visit... he has multiple medical problems as noted below...    ~  December 06, 2011:  Yearly [name] continues to do well w/o new complaints or concerns> See Prob List below>>  ~  October 03, 2012:  30mo ROV & Michail presents w/ a 8mo hx of pain in the abd> initially seen at [facility]) & told he had gallstones (we don't have these records or the scan report); he recently saw [facility] for his regular Urology f/u & he rec that [facility] f/u w/ [name] because it sounded more like diverticulitis; pain has been occurring off&on since June, predom left sided (LUQ & flank), pains lasts several days & seems to resolve on it's own, then returns, denies n/v/d but has some constip & notes ?intermit relief w/ BM; pt had Colonoscopy by [facility] 1/13- 68mm polyp in desc colon (tubular adenoma), mod diverticulosis in sigmoid, radiation proctitis in rectum;  He also had appendectomy at age 68;  Denies dysuria, hematuria, or colicky renal pain...  We reviewed the need for Labs & CT Abd&Pelvis- see below...    MildCOPD/ HxAsbestos exposure/ s/p resection of intercostal cyst 1997> breathing is good, no cough/ phlegm/ hemoptysis/ CP/ dyspnea/ etc...    HBP> controlled on Amlodip5, Diovan160; BP= 140/78 & denies CP, palpit, SOB, edema, etc...    Chol> on diet alone & labs 1/13 ok x LDL=120    Hx prostate cancer/ LUTS> s/p brachytherapy per [facility] 2011 & followed by [name] on Flomax0.4 & Vesicare5; pt tells me his PSA has been falling & when last measured by Urology ~0.3    DJD, LBP, Cx Spondylosis> all followed by [name]; he is very stoic & not taking pain meds...    PA> on B12 shots monthly... We reviewed prob list, meds, xrays and labs> see below for updates >>  LABS 10/03/12:  Chems- ok x BUN=25 Creat=1.5 K=5.5;  CBC- wnl;  TSH=1.84;  Sed=16;  UA= trace heme, few rbc/wbc/bact... CT  Abd&Pelvis 10/05/12 showed several distal left ureteral calculi- 2cm above UVJ, largest 5.76mm size, no hydronephrosis therefore NON-obstructing at present;  Mult other findings on the CT in this 80 y/o man> basilar atx/ scarring, coronary & abd ao/ iliac calcif, bilat renal cysts, mult layering gallstones, 1.7cm right adrenal adenoma, sigmoid diverticulosis w/o inflamm, intraprostatic therapy seeds, no adenopathy, sm fat containing LIH, degen changes in spine...   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     We reviewed prob list, meds, xrays and labs> see below for updates >> he had the 2014 Flu vaccine in Oct... CXR 2/15 showed borderline cardiomeg, metallic clips and old right rib fractures, pleuroparenchymal scarring, NAD.Marland KitchenMarland Kitchen EKG 2/15 showed [place], [place], old IWMI, NAD... LABS 2/15:  FLP- at goals x LDL=122 on diet rx;  Chems- ok x Cr=1.4;  CBC- wnl;  TSH=2.46;  PSA=0.13;  B12=765.Marland KitchenMarland Kitchen  Problem List:  COPD ([id number]) - ex-smoker (quit~1980) w/ hx COPD... no active symptoms... doing satis, no meds, no recent exac- denies SOB, CP, edema, etc- min AM cough/ no phlegm/ no blood etc...  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 ~  CXR 1/12 showed s/p surg on right, chr changes, NAD.[name]. ~  CXR 1/13 showed clear lungs, post surg changes on right w/o interval change, NAD.[name]. ~  CXR 2/15 showed borderline cardiomeg, metallic clips and old right rib fractures, pleuroparenchymal scarring, NAD...  HYPERTENSION (ICD-401.9) - on ASA 81mg /d, NORVASC  5mg /d and DIOVAN 160mg /d...  ~  + FamHx CAD- Bro w/ CABG... ~  2DEcho 9/98 showed mild LVH, norm LVF, mild AI... ~  1/12:  BP= 144/82 but all 130's/ 80's at home; denies HA, fatigue, visual changes, CP, palipit, dizziness, syncope, dyspnea, edema, etc... ~  1/13:  BP= 138/76 & he remains essentially asymptomatic... ~  2/15: on Amlod5 & Diovan160;  BP= 148/60 & he denies CP, palpit, SOB, edema, etc...  Hx of PAC (ICD-427.61) - neg cardiac eval 1998... he takes ASA 81mg /d...  HYPERCHOLESTEROLEMIA, BORDERLINE (ICD-272.4) ~  FLP 9/07 showed TChol 161, TG 54, HDL 33, LDL 117... on diet + exercise Rx... ~  [place] 5/09 showed TChol 155, TG 73, HDL 28, LDL 112 ~  FLP 6/10 showed TChol 160, TG 82, HDL 34, LDL 110 ~  FLP 1/12 showed TChol 159, TG 86, HDL 33, LDL 109 ~  FLP 1/13 on diet alone showed TChol 171, TG 68, HDL 37, LDL 120 ~  FLP 2/15 on diet alone showed TChol 177, TG 100, HDL 35, LDL 122  DIVERTICULAR DISEASE (ICD-562.10) - colonoscopy 4/03 by Demetra Shiner showed divertics only... f/u rec 10 yrs. ~  1/13: f/u colonoscopy is due 4/13...  BENIGN PROSTATIC HYPERTROPHY, HX OF (ICD-V13.8) ADENOCARCINOMA, PROSTATE (ICD-185) HYPOGONADISM (ICD-257.2) - prev hx of intol to Doxazosin, therefore changed to [facility] 0.4mg /d but states no benefit to his decr stream so he stopped it; only mild LTOS & denies incontinence, freq, nocturia... ~  labs 4/08 showed PSA= 2.91 ~  labs 5/09 showed PSA= 3.58 ~  labs 6/10 showed PSA= 4.31 (note: Testosterone level= 255)... refer to Urology, DrDavis ==> & eventual Dx Prostate Cancer & he decided on Brachytherapy per DrManning  (done 6/11)... ~  pt tells me he continues to follow up w/ DrDavis Q48mo but we don't have notes from him... ~  1/13: pt reports that DrDavis started [place] and that the combination is helping his urination; last seen 9/12 & he reports that PSA was "ok"... ~  11/13:  Pt reports that his PSA is down to 0.3 per Urology; NOTE> labs here showed  BUN=25 Creat=1.5 K=5.5  ~  2/15:  ?when he last saw Urology, [facility] (we do not have notes from them); he requests labs to be done here today> PSA=0.13, Cr=1.4.Marland KitchenMarland Kitchen  Left URETERAL STONES/  Bilat RENAL CYSTS >> ~  11/13:  Pt presented w/ 40mo hx intermittent left sided abd / flank pain & eval from [facility] showing gallstones; DrDavis referred pt here for further eval of what sounded like diverticulitis but CT scan showed several distal ureteral stones ~2cm above the UVJ w/o hydronephrosis suggesting non-obstructive stones (largest= 5.72mm)- see above for mult other CT findings including bilat renal cysts, right adrenal adenoma, & mult sm layering gallstones;  We increased his Flomax0.4mg - take 2Qhs for medical  expulsive therapy & referred him back to Urology... ~  2/15:  On Flomax0.4 & Vesicare5; Pt reports that he saw Urology after last OV, agreed w/ incr Flomax 0.4mg - 2Qhs; pt thinks he passed 1-2 stones, & not further problems reported...  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  BACK PAIN, LUMBAR (ICD-724.2) - s/p L4-5 laminectomy and microdiscectomy 5/01 by [facility]- pt is reluctant to return since he was told he'd need rods & doesn't want them; he tried shots yrs ago w/o benefit...  CERVICAL SPONDYLOSIS WITHOUT MYELOPATHY (ICD-721.0) - he also has known CSpine spondylosis and foramenal stenosis- followed by [name]...  ANEMIA, PERNICIOUS (ICD-281.0) - on B 12 shots monthly... ~  labs 5/09 showed B12= 602 ~  labs 6/10 showed B12= >1500 ~  labs 1/12 showed Hg= 15.9, B12= 497 ~  Labs 1/13 showed Hg= 16.7, B12 level >1500 & he tells me he takes B12 tabs Bid as well (he likes it that way). ~  Labs 11/13 showed Hg= 15.2 ~  Labs 2/15 on B12 shots monthly showed Hg= 16.4, MCV=88, B12 level= 765...   Past Surgical History  Procedure Laterality Date  . Appendectomy    . Surgery for removal  of intercostal cyst  1997  . Lumbar laminectomy l4-5  2001    Dr. Luiz Ochoa  . Cts surgery on right      Dr. Amedeo Plenty  . Prostate biopsy, then brachytherapy  04/2010    Dr. Rosana Hoes    Outpatient Encounter Prescriptions as of 01/02/2014  Medication Sig  . amLODipine (NORVASC) 5 MG tablet take 1 tablet by mouth once daily  . aspirin 81 MG tablet Take 160 mg by mouth daily.    [name] DIOVAN 160 MG tablet take 1 tablet once daily  . Multiple Vitamins-Minerals (CENTRUM SILVER PO) Take 1 tablet by mouth daily.    . solifenacin (VESICARE) 5 MG tablet Take 5 mg by mouth daily.    . tamsulosin (FLOMAX) 0.4 MG CAPS capsule take 1 capsule at bedtime  . traMADol (ULTRAM) 50 MG tablet Take 1 tablet (50 mg total) by mouth 3 (three) times daily as needed for pain.  . vitamin B-12 (CYANOCOBALAMIN) 1000 MCG tablet Take 1,000 mcg by mouth daily.      Allergies  Allergen Reactions  . Lisinopril     REACTION: Hx of ACE cough...    Current Medications, Allergies, Past Medical History, Past Surgical History, Family History, and Social History were reviewed in [employer] record.     Review of Systems         The patient complains of abd discomfort, malaise, dyspnea on exertion, urinary frequency, nocturia, joint pain, stiffness, and arthritis.  The patient denies fever, chills, sweats, anorexia, fatigue, weakness, weight loss, sleep disorder, blurring, diplopia, eye irritation, eye discharge, vision loss, eye pain, photophobia, earache, ear discharge, tinnitus, decreased hearing, nasal congestion, nosebleeds, sore throat, hoarseness, chest pain, palpitations, syncope, orthopnea, PND, peripheral edema, cough, dyspnea at rest, excessive sputum, hemoptysis, wheezing, pleurisy, nausea, vomiting, diarrhea, change in bowel habits, melena, hematochezia, jaundice, gas/bloating, indigestion/ heartburn, dysphagia, odynophagia, dysuria, hematuria, urinary hesitancy, incontinence, joint swelling, muscle  cramps, muscle weakness, restless legs, leg pain at night, leg pain with exertion, rash, itching, dryness, suspicious lesions, paralysis, paresthesias, seizures, tremors, vertigo, transient blindness, frequent falls, frequent headaches, difficulty walking, depression, anxiety, memory loss, confusion, cold intolerance, heat intolerance, polydipsia, polyphagia, polyuria,  unusual weight change, abnormal bruising, bleeding, enlarged lymph nodes, urticaria, allergic rash, hay fever, and recurrent infections.   Objective:   Physical Exam    WD, WN, 80 y/o WM in NAD... GENERAL:  Alert & oriented; pleasant & cooperative... HEENT:  [place]/AT, , EACs-clear, TMs-wnl, NOSE-clear, THROAT-clear & wnl. NECK:  Supple w/ fairROM; no JVD; normal carotid impulses w/o bruits; no thyromegaly or nodules palpated; no lymphadenopathy. CHEST:  Clear to P & A; without wheezes/ rales/ or rhonchi heard... HEART:  Regular Rhythm; without murmurs/ rubs/ or gallops detected... ABDOMEN:  Soft & nontender; min discomfort in LLQ; normal bowel sounds; no organomegaly or masses palpated... EXT: without deformities, mild arthritic changes; no varicose veins/ venous insuffic/ or edema. NEURO:  CN's intact;  no focal neuro deficits... DERM:  No lesions noted; no rash etc...  RADIOLOGY DATA:  Reviewed in the EPIC EMR & discussed w/ the patient...  LABORATORY DATA:  Reviewed in the EPIC EMR & discussed w/ the patient...   Assessment:      COPD, Hx Asbestos Exposure>  He quit smoking ~1980, CXR is stable post op changes, NAD...  HBP>  Controlled on his 2 meds, continue same...  CHOL>  Controlled on diet alone but LDL needs improvement...  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  GU> BPH, Prostate Cancer, Hypogonadism>  Followed by DPOEUMPN at [facility] on [place], pt not on  Testos replacement rx; he requested PSA here 2/15 = 0.13...  DJD, LBP, Cervical Spondylosis>  He manages quite well w/ OTC analgesics as needed...  Hx PA/ B12 Defic>  His B12 level is =765 now on monthly B12 shots, continue same...     Plan:     Patient's Medications  New Prescriptions   FLUOCINONIDE-EMOLLIENT (LIDEX-E) 0.05 % CREAM    Apply 1 application topically 2 (two) times daily.  Previous Medications   ASPIRIN 81 MG TABLET    Take 160 mg by mouth daily.     CYANOCOBALAMIN (,VITAMIN B-12,) 1000 MCG/ML INJECTION    Inject 1,000 mcg into the muscle every 30 (thirty) days.   MULTIPLE VITAMINS-MINERALS (CENTRUM SILVER PO)    Take 1 tablet by mouth daily.     SOLIFENACIN (VESICARE) 5 MG TABLET    Take 5 mg by mouth daily.     TRAMADOL (ULTRAM) 50 MG TABLET    Take 1 tablet (50 mg total) by mouth 3 (three) times daily as needed for pain.  Modified Medications   Modified Medication Previous Medication   AMLODIPINE (NORVASC) 5 MG TABLET amLODipine (NORVASC) 5 MG tablet      take 1 tablet by mouth once daily    take 1 tablet by mouth once daily   TAMSULOSIN (FLOMAX) 0.4 MG CAPS CAPSULE tamsulosin (FLOMAX) 0.4 MG CAPS capsule      take 1 capsule at bedtime    take 1 capsule at bedtime   VALSARTAN (DIOVAN) 160 MG TABLET DIOVAN 160 MG tablet      take 1 tablet once daily    take 1 tablet once daily  Discontinued Medications   VITAMIN B-12 (CYANOCOBALAMIN) 1000 MCG TABLET    Take 1,000 mcg by mouth daily.

## 2014-01-13 ENCOUNTER — Telehealth: Payer: Self-pay | Admitting: Pulmonary Disease

## 2014-01-13 ENCOUNTER — Ambulatory Visit: Payer: Medicare Other

## 2014-01-13 DIAGNOSIS — M545 Low back pain, unspecified: Secondary | ICD-10-CM

## 2014-01-13 NOTE — Telephone Encounter (Signed)
Per SN: ok to refer to [facility] neurosurgery Dr. Sherley Bounds  i called LMTCB x1

## 2014-01-13 NOTE — Telephone Encounter (Signed)
Pt is requesting a referral to have back checked out. He reports he has had back surgery in the past but that doc is now retired. He reports it is in the lower back. Requesting referral. Please advise SN thanks

## 2014-01-15 ENCOUNTER — Ambulatory Visit (INDEPENDENT_AMBULATORY_CARE_PROVIDER_SITE_OTHER): Payer: Medicare Other

## 2014-01-15 DIAGNOSIS — D510 Vitamin B12 deficiency anemia due to intrinsic factor deficiency: Secondary | ICD-10-CM

## 2014-01-15 NOTE — Telephone Encounter (Signed)
Spoke with pt.  He is aware referral was placed for Dr. Ronnald Ramp at [facility] and he will receive a call with appt date and time.  He verbalized understanding and voiced no further questions or concerns at this time.

## 2014-01-16 MED ORDER — CYANOCOBALAMIN 1000 MCG/ML IJ SOLN
1000.0000 ug | Freq: Once | INTRAMUSCULAR | Status: AC
  Administered 2014-01-16: 1000 ug via INTRAMUSCULAR

## 2014-02-10 ENCOUNTER — Other Ambulatory Visit: Payer: Self-pay | Admitting: Neurological Surgery

## 2014-02-18 ENCOUNTER — Ambulatory Visit (INDEPENDENT_AMBULATORY_CARE_PROVIDER_SITE_OTHER): Payer: Medicare Other

## 2014-02-18 ENCOUNTER — Ambulatory Visit: Payer: Medicare Other

## 2014-02-18 DIAGNOSIS — D649 Anemia, unspecified: Secondary | ICD-10-CM

## 2014-02-24 MED ORDER — CYANOCOBALAMIN 1000 MCG/ML IJ SOLN
1000.0000 ug | Freq: Once | INTRAMUSCULAR | Status: AC
  Administered 2014-02-24: 1000 ug via INTRAMUSCULAR

## 2014-02-25 ENCOUNTER — Encounter (HOSPITAL_COMMUNITY): Payer: Self-pay | Admitting: Pharmacy Technician

## 2014-02-26 ENCOUNTER — Encounter (HOSPITAL_COMMUNITY): Payer: Self-pay

## 2014-02-26 ENCOUNTER — Other Ambulatory Visit (HOSPITAL_COMMUNITY): Payer: Self-pay | Admitting: *Deleted

## 2014-02-26 NOTE — Pre-Procedure Instructions (Signed)
Joseph Carter  02/26/2014   Your procedure is scheduled on:  Friday, March 07, 2014 at 7:30 AM.   Report to [facility] Entrance "A" Admitting Office at 5:30 AM.   Call this number if you have problems the morning of surgery: [phone]   Remember:   Do not eat food or drink liquids after midnight Thursday, 03/06/14.   Take these medicines the morning of surgery with A SIP OF WATER: amLODipine (NORVASC), traMADol (ULTRAM) - if needed.  Stop Vitamins, Aspirin and Naproxen (Aleve) as of Friday, 02/28/14.    Do not wear jewelry.  Do not wear lotions, powders, or cologne. You may wear deodorant.  Men may shave face and neck.  Do not bring valuables to the hospital.  [facility] is not responsible                  for any belongings or valuables.               Contacts, dentures or bridgework may not be worn into surgery.  Leave suitcase in the car. After surgery it may be brought to your room.  For patients admitted to the hospital, discharge time is determined by your                treatment team.               Special Instructions: [name] - Preparing for Surgery  Before surgery, you can play an important role.  Because skin is not sterile, your skin needs to be as free of germs as possible.  You can reduce the number of germs on you skin by washing with CHG (chlorahexidine gluconate) soap before surgery.  CHG is an antiseptic cleaner which kills germs and bonds with the skin to continue killing germs even after washing.  Please DO NOT use if you have an allergy to CHG or antibacterial soaps.  If your skin becomes reddened/irritated stop using the CHG and inform your nurse when you arrive at Short Stay.  Do not shave (including legs and underarms) for at least 48 hours prior to the first CHG shower.  You may shave your face.  Please follow these instructions carefully:   1.  Shower with CHG Soap the night before surgery and the                                morning of  Surgery.  2.  If you choose to wash your hair, wash your hair first as usual with your       normal shampoo.  3.  After you shampoo, rinse your hair and body thoroughly to remove the                      Shampoo.  4.  Use CHG as you would any other liquid soap.  You can apply chg directly       to the skin and wash gently with scrungie or a clean washcloth.  5.  Apply the CHG Soap to your body ONLY FROM THE NECK DOWN.        Do not use on open wounds or open sores.  Avoid contact with your eyes, ears, mouth and genitals (private parts).  Wash genitals (private parts) with your normal soap.  6.  Wash thoroughly, paying special attention to the area where your surgery  will be performed.  7.  Thoroughly rinse your body with warm water from the neck down.  8.  DO NOT shower/wash with your normal soap after using and rinsing off       the CHG Soap.  9.  Pat yourself dry with a clean towel.            10.  Wear clean pajamas.            11.  Place clean sheets on your bed the night of your first shower and do not        sleep with pets.  Day of Surgery  Do not apply any lotions the morning of surgery.  Please wear clean clothes to the hospital/surgery center.     Please read over the following fact sheets that you were given: Pain Booklet, Coughing and Deep Breathing, Blood Transfusion Information, MRSA Information and Surgical Site Infection Prevention

## 2014-02-27 ENCOUNTER — Encounter (HOSPITAL_COMMUNITY)
Admission: RE | Admit: 2014-02-27 | Discharge: 2014-02-27 | Disposition: A | Discharge status: Home / Self Care | Payer: Medicare Other | Source: Ambulatory Visit | Attending: Neurological Surgery | Admitting: Neurological Surgery

## 2014-02-27 ENCOUNTER — Encounter (HOSPITAL_COMMUNITY): Payer: Self-pay

## 2014-02-27 DIAGNOSIS — Z01812 Encounter for preprocedural laboratory examination: Secondary | ICD-10-CM | POA: Insufficient documentation

## 2014-02-27 HISTORY — DX: Personal history of urinary calculi: Z87.442

## 2014-02-27 LAB — SURGICAL PCR SCREEN
MRSA, PCR: NEGATIVE
STAPHYLOCOCCUS AUREUS: NEGATIVE

## 2014-02-27 LAB — CBC WITH DIFFERENTIAL/PLATELET
BASOS ABS: 0 10*3/uL (ref 0.0–0.1)
Basophils Relative: 1 % (ref 0–1)
EOS PCT: 2 % (ref 0–5)
Eosinophils Absolute: 0.2 10*3/uL (ref 0.0–0.7)
HCT: 46.6 % (ref 39.0–52.0)
Hemoglobin: 16.6 g/dL (ref 13.0–17.0)
LYMPHS ABS: 2.2 10*3/uL (ref 0.7–4.0)
LYMPHS PCT: 26 % (ref 12–46)
MCH: 29.6 pg (ref 26.0–34.0)
MCHC: 35.6 g/dL (ref 30.0–36.0)
MCV: 83.2 fL (ref 78.0–100.0)
Monocytes Absolute: 0.6 10*3/uL (ref 0.1–1.0)
Monocytes Relative: 7 % (ref 3–12)
NEUTROS ABS: 5.3 10*3/uL (ref 1.7–7.7)
NEUTROS PCT: 64 % (ref 43–77)
PLATELETS: 166 10*3/uL (ref 150–400)
RBC: 5.6 MIL/uL (ref 4.22–5.81)
RDW: 13.3 % (ref 11.5–15.5)
WBC: 8.2 10*3/uL (ref 4.0–10.5)

## 2014-02-27 LAB — BASIC METABOLIC PANEL
BUN: 18 mg/dL (ref 6–23)
CALCIUM: 10.7 mg/dL — AB (ref 8.4–10.5)
CO2: 24 mEq/L (ref 19–32)
Chloride: 106 mEq/L (ref 96–112)
Creatinine, Ser: 1.22 mg/dL (ref 0.50–1.35)
GFR calc non Af Amer: 55 mL/min — ABNORMAL LOW (ref >90)
GFR, EST AFRICAN AMERICAN: 63 mL/min — AB (ref >90)
Glucose, Bld: 116 mg/dL — ABNORMAL HIGH (ref 70–99)
Potassium: 4.8 mEq/L (ref 3.7–5.3)
Sodium: 141 mEq/L (ref 137–147)

## 2014-02-27 LAB — PROTIME-INR
INR: 1.09 (ref 0.00–1.49)
Prothrombin Time: 13.9 seconds (ref 11.6–15.2)

## 2014-02-27 LAB — ABO/RH: ABO/RH(D): B POS

## 2014-02-27 LAB — TYPE AND SCREEN
ABO/RH(D): B POS
ANTIBODY SCREEN: NEGATIVE

## 2014-02-27 NOTE — Pre-Procedure Instructions (Signed)
Joseph Carter  02/27/2014   Your procedure is scheduled on:  Friday, March 07, 2014 at 7:30 AM.   Report to [facility] Entrance "A" Admitting Office at 5:30 AM.   Call this number if you have problems the morning of surgery: [phone]   Remember:   Do not eat food or drink liquids after midnight Thursday, 03/06/14.   Take these medicines the morning of surgery with A SIP OF WATER: amLODipine (NORVASC), traMADol (ULTRAM) - if needed.  Stop Vitamins, Aspirin and Naproxen (Aleve) as of Friday, 02/28/14.    Do not wear jewelry.  Do not wear lotions, powders, or cologne. You may wear deodorant.  Men may shave face and neck.  Do not bring valuables to the hospital.  [facility] is not responsible  for any belongings or valuables.               Contacts, dentures or bridgework may not be worn into surgery.  Leave suitcase in the car. After surgery it may be brought to your room.  For patients admitted to the hospital, discharge time is determined by your treatment team.               Special Instructions: [place] - Preparing for Surgery  Before surgery, you can play an important role.  Because skin is not sterile, your skin needs to be as free of germs as possible.  You can reduce the number of germs on you skin by washing with CHG (chlorahexidine gluconate) soap before surgery.  CHG is an antiseptic cleaner which kills germs and bonds with the skin to continue killing germs even after washing.  Please DO NOT use if you have an allergy to CHG or antibacterial soaps.  If your skin becomes reddened/irritated stop using the CHG and inform your nurse when you arrive at Short Stay.  Do not shave (including legs and underarms) for at least 48 hours prior to the first CHG shower.  You may shave your face.  Please follow these instructions carefully:   1.  Shower with CHG Soap the night before surgery and the                                morning of Surgery.  2.  If you choose to  wash your hair, wash your hair first as usual with your       normal shampoo.  3.  After you shampoo, rinse your hair and body thoroughly to remove the                      Shampoo.  4.  Use CHG as you would any other liquid soap.  You can apply chg directly       to the skin and wash gently with scrungie or a clean washcloth.  5.  Apply the CHG Soap to your body ONLY FROM THE NECK DOWN.        Do not use on open wounds or open sores.  Avoid contact with your eyes, ears, mouth and genitals (private parts).  Wash genitals (private parts) with your normal soap.  6.  Wash thoroughly, paying special attention to the area where your surgery        will be performed.  7.  Thoroughly rinse your body with warm water from the neck down.  8.  DO NOT shower/wash with your normal soap after  using and rinsing off       the CHG Soap.  9.  Pat yourself dry with a clean towel.            10.  Wear clean pajamas.            11.  Place clean sheets on your bed the night of your first shower and do not        sleep with pets.  Day of Surgery  Do not apply any lotions the morning of surgery.  Please wear clean clothes to the hospital/surgery center.     Please read over the following fact sheets that you were given: Pain Booklet, Coughing and Deep Breathing, Blood Transfusion Information, MRSA Information and Surgical Site Infection Prevention    

## 2014-02-27 NOTE — Pre-Procedure Instructions (Signed)
Joseph Carter  02/27/2014   Your procedure is scheduled on:  Friday, March 07, 2014 at 7:30 AM.   Report to [facility] Entrance "A" Admitting Office at 5:30 AM.   Call this number if you have problems the morning of surgery: [phone]   Remember:   Do not eat food or drink liquids after midnight Thursday, 03/06/14.   Take these medicines the morning of surgery with A SIP OF WATER: amLODipine (NORVASC), traMADol (ULTRAM) - if needed.  Stop Vitamins, Aspirin and Naproxen (Aleve) as of Friday, 02/28/14.    Do not wear jewelry.  Do not wear lotions, powders, or cologne. You may wear deodorant.  Men may shave face and neck.  Do not bring valuables to the hospital.  [facility] is not responsible  for any belongings or valuables.               Contacts, dentures or bridgework may not be worn into surgery.  Leave suitcase in the car. After surgery it may be brought to your room.  For patients admitted to the hospital, discharge time is determined by your treatment team.               Special Instructions: [place] - Preparing for Surgery  Before surgery, you can play an important role.  Because skin is not sterile, your skin needs to be as free of germs as possible.  You can reduce the number of germs on you skin by washing with CHG (chlorahexidine gluconate) soap before surgery.  CHG is an antiseptic cleaner which kills germs and bonds with the skin to continue killing germs even after washing.  Please DO NOT use if you have an allergy to CHG or antibacterial soaps.  If your skin becomes reddened/irritated stop using the CHG and inform your nurse when you arrive at Short Stay.  Do not shave (including legs and underarms) for at least 48 hours prior to the first CHG shower.  You may shave your face.  Please follow these instructions carefully:   1.  Shower with CHG Soap the night before surgery and the                                morning of Surgery.  2.  If you choose to  wash your hair, wash your hair first as usual with your       normal shampoo.  3.  After you shampoo, rinse your hair and body thoroughly to remove the                      Shampoo.  4.  Use CHG as you would any other liquid soap.  You can apply chg directly       to the skin and wash gently with scrungie or a clean washcloth.  5.  Apply the CHG Soap to your body ONLY FROM THE NECK DOWN.        Do not use on open wounds or open sores.  Avoid contact with your eyes, ears, mouth and genitals (private parts).  Wash genitals (private parts) with your normal soap.  6.  Wash thoroughly, paying special attention to the area where your surgery        will be performed.  7.  Thoroughly rinse your body with warm water from the neck down.  8.  DO NOT shower/wash with your normal soap after  using and rinsing off       the CHG Soap.  9.  Pat yourself dry with a clean towel.            10.  Wear clean pajamas.            11.  Place clean sheets on your bed the night of your first shower and do not        sleep with pets.  Day of Surgery  Do not apply any lotions the morning of surgery.  Please wear clean clothes to the hospital/surgery center.     Please read over the following fact sheets that you were given: Pain Booklet, Coughing and Deep Breathing, Blood Transfusion Information, MRSA Information and Surgical Site Infection Prevention

## 2014-03-06 MED ORDER — CEFAZOLIN SODIUM-DEXTROSE 2-3 GM-% IV SOLR
2.0000 g | INTRAVENOUS | Status: AC
  Administered 2014-03-07: 2 g via INTRAVENOUS
  Filled 2014-03-06: qty 50

## 2014-03-06 MED ORDER — DEXAMETHASONE SODIUM PHOSPHATE 10 MG/ML IJ SOLN
10.0000 mg | INTRAMUSCULAR | Status: AC
  Administered 2014-03-07: 10 mg via INTRAVENOUS
  Filled 2014-03-06: qty 1

## 2014-03-07 ENCOUNTER — Encounter (HOSPITAL_COMMUNITY): Payer: Medicare Other | Admitting: Certified Registered"

## 2014-03-07 ENCOUNTER — Encounter (HOSPITAL_COMMUNITY)
Admission: RE | Disposition: A | Discharge status: Home / Self Care | Payer: Self-pay | Source: Ambulatory Visit | Attending: Neurological Surgery

## 2014-03-07 ENCOUNTER — Inpatient Hospital Stay (HOSPITAL_COMMUNITY): Payer: Medicare Other

## 2014-03-07 ENCOUNTER — Inpatient Hospital Stay (HOSPITAL_COMMUNITY): Payer: Medicare Other | Admitting: Certified Registered"

## 2014-03-07 ENCOUNTER — Encounter (HOSPITAL_COMMUNITY): Payer: Self-pay

## 2014-03-07 ENCOUNTER — Inpatient Hospital Stay (HOSPITAL_COMMUNITY)
Admission: RE | Admit: 2014-03-07 | Discharge: 2014-03-08 | DRG: 460 | Disposition: A | Discharge status: Home / Self Care | Payer: Medicare Other | Source: Ambulatory Visit | Attending: Neurological Surgery | Admitting: Neurological Surgery

## 2014-03-07 DIAGNOSIS — I10 Essential (primary) hypertension: Secondary | ICD-10-CM | POA: Diagnosis present

## 2014-03-07 DIAGNOSIS — F17200 Nicotine dependence, unspecified, uncomplicated: Secondary | ICD-10-CM | POA: Diagnosis present

## 2014-03-07 DIAGNOSIS — Z981 Arthrodesis status: Secondary | ICD-10-CM

## 2014-03-07 DIAGNOSIS — M4310 Spondylolisthesis, site unspecified: Principal | ICD-10-CM | POA: Diagnosis present

## 2014-03-07 HISTORY — PX: MAXIMUM ACCESS (MAS)POSTERIOR LUMBAR INTERBODY FUSION (PLIF) 1 LEVEL: SHX6368

## 2014-03-07 HISTORY — PX: LUMBAR LAMINECTOMY: SHX95

## 2014-03-07 SURGERY — FOR MAXIMUM ACCESS (MAS) POSTERIOR LUMBAR INTERBODY FUSION (PLIF) 1 LEVEL
Anesthesia: General | Site: Back

## 2014-03-07 MED ORDER — SUCCINYLCHOLINE CHLORIDE 20 MG/ML IJ SOLN
INTRAMUSCULAR | Status: DC | PRN
  Administered 2014-03-07: 120 mg via INTRAVENOUS

## 2014-03-07 MED ORDER — LIDOCAINE HCL (CARDIAC) 20 MG/ML IV SOLN
INTRAVENOUS | Status: AC
  Filled 2014-03-07: qty 5

## 2014-03-07 MED ORDER — CEFAZOLIN SODIUM 1-5 GM-% IV SOLN
1.0000 g | Freq: Three times a day (TID) | INTRAVENOUS | Status: AC
  Administered 2014-03-07 (×2): 1 g via INTRAVENOUS
  Filled 2014-03-07 (×2): qty 50

## 2014-03-07 MED ORDER — PHENYLEPHRINE HCL 10 MG/ML IJ SOLN
10.0000 mg | INTRAVENOUS | Status: DC | PRN
  Administered 2014-03-07: 50 ug/min via INTRAVENOUS

## 2014-03-07 MED ORDER — PROPOFOL 10 MG/ML IV BOLUS
INTRAVENOUS | Status: DC | PRN
  Administered 2014-03-07: 150 mg via INTRAVENOUS

## 2014-03-07 MED ORDER — GELATIN ABSORBABLE MT POWD
OROMUCOSAL | Status: DC | PRN
  Administered 2014-03-07: 08:00:00 via TOPICAL

## 2014-03-07 MED ORDER — PHENYLEPHRINE 40 MCG/ML (10ML) SYRINGE FOR IV PUSH (FOR BLOOD PRESSURE SUPPORT)
PREFILLED_SYRINGE | INTRAVENOUS | Status: AC
  Filled 2014-03-07: qty 10

## 2014-03-07 MED ORDER — ACETAMINOPHEN 325 MG PO TABS: 650.0000 mg | ORAL_TABLET | ORAL | Status: DC | PRN

## 2014-03-07 MED ORDER — SENNA 8.6 MG PO TABS
1.0000 | ORAL_TABLET | Freq: Two times a day (BID) | ORAL | Status: DC
  Administered 2014-03-07 – 2014-03-08 (×3): 8.6 mg via ORAL
  Filled 2014-03-07 (×3): qty 1

## 2014-03-07 MED ORDER — ONDANSETRON HCL 4 MG/2ML IJ SOLN
INTRAMUSCULAR | Status: AC
  Filled 2014-03-07: qty 2

## 2014-03-07 MED ORDER — 0.9 % SODIUM CHLORIDE (POUR BTL) OPTIME
TOPICAL | Status: DC | PRN
  Administered 2014-03-07: 1000 mL

## 2014-03-07 MED ORDER — ROCURONIUM BROMIDE 50 MG/5ML IV SOLN
INTRAVENOUS | Status: AC
  Filled 2014-03-07: qty 1

## 2014-03-07 MED ORDER — TAMSULOSIN HCL 0.4 MG PO CAPS
0.4000 mg | ORAL_CAPSULE | Freq: Every day | ORAL | Status: DC
  Administered 2014-03-07: 0.4 mg via ORAL
  Filled 2014-03-07 (×3): qty 1

## 2014-03-07 MED ORDER — PROPOFOL 10 MG/ML IV BOLUS
INTRAVENOUS | Status: AC
  Filled 2014-03-07: qty 20

## 2014-03-07 MED ORDER — AMLODIPINE BESYLATE 5 MG PO TABS
5.0000 mg | ORAL_TABLET | Freq: Every day | ORAL | Status: DC
  Administered 2014-03-08: 5 mg via ORAL
  Filled 2014-03-07: qty 1

## 2014-03-07 MED ORDER — MORPHINE SULFATE 2 MG/ML IJ SOLN: 1.0000 mg | INTRAMUSCULAR | Status: DC | PRN

## 2014-03-07 MED ORDER — SODIUM CHLORIDE 0.9 % IJ SOLN
3.0000 mL | Freq: Two times a day (BID) | INTRAMUSCULAR | Status: DC
  Administered 2014-03-07: 3 mL via INTRAVENOUS

## 2014-03-07 MED ORDER — BUPIVACAINE HCL (PF) 0.25 % IJ SOLN
INTRAMUSCULAR | Status: DC | PRN
Start: 2014-03-07 — End: 2014-03-07
  Administered 2014-03-07: 3 mL

## 2014-03-07 MED ORDER — SODIUM CHLORIDE 0.9 % IR SOLN
Status: DC | PRN
  Administered 2014-03-07: 08:00:00

## 2014-03-07 MED ORDER — FENTANYL CITRATE 0.05 MG/ML IJ SOLN
INTRAMUSCULAR | Status: AC
  Filled 2014-03-07: qty 5

## 2014-03-07 MED ORDER — SODIUM CHLORIDE 0.9 % IJ SOLN: 3.0000 mL | INTRAMUSCULAR | Status: DC | PRN

## 2014-03-07 MED ORDER — ACETAMINOPHEN 650 MG RE SUPP: 650.0000 mg | RECTAL | Status: DC | PRN

## 2014-03-07 MED ORDER — POTASSIUM CHLORIDE IN NACL 20-0.9 MEQ/L-% IV SOLN
INTRAVENOUS | Status: DC
  Filled 2014-03-07 (×3): qty 1000

## 2014-03-07 MED ORDER — ACETAMINOPHEN 10 MG/ML IV SOLN
INTRAVENOUS | Status: AC
  Filled 2014-03-07: qty 100

## 2014-03-07 MED ORDER — MENTHOL 3 MG MT LOZG: 1.0000 | LOZENGE | OROMUCOSAL | Status: DC | PRN

## 2014-03-07 MED ORDER — EPHEDRINE SULFATE 50 MG/ML IJ SOLN
INTRAMUSCULAR | Status: AC
  Filled 2014-03-07: qty 1

## 2014-03-07 MED ORDER — ARTIFICIAL TEARS OP OINT
TOPICAL_OINTMENT | OPHTHALMIC | Status: AC
  Filled 2014-03-07: qty 3.5

## 2014-03-07 MED ORDER — FENTANYL CITRATE 0.05 MG/ML IJ SOLN
INTRAMUSCULAR | Status: AC
Start: 2014-03-07 — End: 2014-03-07
  Filled 2014-03-07: qty 5

## 2014-03-07 MED ORDER — IRBESARTAN 75 MG PO TABS
75.0000 mg | ORAL_TABLET | Freq: Every day | ORAL | Status: DC
  Administered 2014-03-08: 75 mg via ORAL
  Filled 2014-03-07: qty 1

## 2014-03-07 MED ORDER — SUCCINYLCHOLINE CHLORIDE 20 MG/ML IJ SOLN
INTRAMUSCULAR | Status: AC
  Filled 2014-03-07: qty 1

## 2014-03-07 MED ORDER — PHENYLEPHRINE HCL 10 MG/ML IJ SOLN
INTRAMUSCULAR | Status: DC | PRN
  Administered 2014-03-07: 80 ug via INTRAVENOUS
  Administered 2014-03-07: 40 ug via INTRAVENOUS

## 2014-03-07 MED ORDER — THROMBIN 20000 UNITS EX SOLR
CUTANEOUS | Status: DC | PRN
  Administered 2014-03-07: 08:00:00 via TOPICAL

## 2014-03-07 MED ORDER — SODIUM CHLORIDE 0.9 % IV SOLN: 250.0000 mL | INTRAVENOUS | Status: DC

## 2014-03-07 MED ORDER — FENTANYL CITRATE 0.05 MG/ML IJ SOLN
25.0000 ug | INTRAMUSCULAR | Status: DC | PRN
Start: 2014-03-07 — End: 2014-03-07
  Administered 2014-03-07: 50 ug via INTRAVENOUS
  Administered 2014-03-07: 25 ug via INTRAVENOUS
  Administered 2014-03-07: 50 ug via INTRAVENOUS
  Administered 2014-03-07: 25 ug via INTRAVENOUS

## 2014-03-07 MED ORDER — ONDANSETRON HCL 4 MG/2ML IJ SOLN
INTRAMUSCULAR | Status: DC | PRN
  Administered 2014-03-07: 4 mg via INTRAVENOUS

## 2014-03-07 MED ORDER — CELECOXIB 200 MG PO CAPS
200.0000 mg | ORAL_CAPSULE | Freq: Two times a day (BID) | ORAL | Status: DC
  Administered 2014-03-07 – 2014-03-08 (×3): 200 mg via ORAL
  Filled 2014-03-07 (×4): qty 1

## 2014-03-07 MED ORDER — METHOCARBAMOL 500 MG PO TABS: 500.0000 mg | ORAL_TABLET | Freq: Four times a day (QID) | ORAL | Status: DC | PRN

## 2014-03-07 MED ORDER — LIDOCAINE HCL (CARDIAC) 20 MG/ML IV SOLN
INTRAVENOUS | Status: DC | PRN
  Administered 2014-03-07: 70 mg via INTRAVENOUS

## 2014-03-07 MED ORDER — EPHEDRINE SULFATE 50 MG/ML IJ SOLN
INTRAMUSCULAR | Status: DC | PRN
  Administered 2014-03-07: 5 mg via INTRAVENOUS
  Administered 2014-03-07: 10 mg via INTRAVENOUS
  Administered 2014-03-07 (×2): 5 mg via INTRAVENOUS
  Administered 2014-03-07: 10 mg via INTRAVENOUS

## 2014-03-07 MED ORDER — METHOCARBAMOL 100 MG/ML IJ SOLN
500.0000 mg | Freq: Four times a day (QID) | INTRAMUSCULAR | Status: DC | PRN
  Filled 2014-03-07: qty 5

## 2014-03-07 MED ORDER — FENTANYL CITRATE 0.05 MG/ML IJ SOLN
INTRAMUSCULAR | Status: DC | PRN
  Administered 2014-03-07: 50 ug via INTRAVENOUS
  Administered 2014-03-07: 100 ug via INTRAVENOUS
  Administered 2014-03-07 (×3): 50 ug via INTRAVENOUS
  Administered 2014-03-07: 100 ug via INTRAVENOUS

## 2014-03-07 MED ORDER — ONDANSETRON HCL 4 MG/2ML IJ SOLN
4.0000 mg | INTRAMUSCULAR | Status: DC | PRN
  Administered 2014-03-07: 4 mg via INTRAVENOUS
  Filled 2014-03-07: qty 2

## 2014-03-07 MED ORDER — LIDOCAINE HCL 4 % MT SOLN
OROMUCOSAL | Status: DC | PRN
  Administered 2014-03-07: 4 mL via TOPICAL

## 2014-03-07 MED ORDER — OXYCODONE-ACETAMINOPHEN 5-325 MG PO TABS
1.0000 | ORAL_TABLET | ORAL | Status: DC | PRN
Start: 2014-03-07 — End: 2014-03-08
  Administered 2014-03-07 (×2): 2 via ORAL
  Filled 2014-03-07 (×2): qty 2

## 2014-03-07 MED ORDER — FENTANYL CITRATE 0.05 MG/ML IJ SOLN
INTRAMUSCULAR | Status: AC
  Filled 2014-03-07: qty 2

## 2014-03-07 MED ORDER — LACTATED RINGERS IV SOLN
INTRAVENOUS | Status: DC | PRN
  Administered 2014-03-07 (×2): via INTRAVENOUS

## 2014-03-07 MED ORDER — PHENOL 1.4 % MT LIQD: 1.0000 | OROMUCOSAL | Status: DC | PRN

## 2014-03-07 MED ORDER — ARTIFICIAL TEARS OP OINT
TOPICAL_OINTMENT | OPHTHALMIC | Status: DC | PRN
  Administered 2014-03-07: 1 via OPHTHALMIC

## 2014-03-07 SURGICAL SUPPLY — 67 items
BAG DECANTER FOR FLEXI CONT (MISCELLANEOUS) ×3 IMPLANT
BENZOIN TINCTURE PRP APPL 2/3 (GAUZE/BANDAGES/DRESSINGS) ×3 IMPLANT
BLADE BN FN 3.2XSTRL LF (MISCELLANEOUS) ×1 IMPLANT
BLADE BONE MILL FINE (MISCELLANEOUS) ×2
BLADE SURG ROTATE 9660 (MISCELLANEOUS) IMPLANT
BONE MATRIX OSTEOCEL PRO MED (Bone Implant) ×3 IMPLANT
BUR MATCHSTICK NEURO 3.0 LAGG (BURR) ×3 IMPLANT
CAGE COROENT 10X9X28-8 LUMBAR (Cage) ×6 IMPLANT
CANISTER SUCT 3000ML (MISCELLANEOUS) ×3 IMPLANT
CLIP NEUROVISION LG (CLIP) ×3 IMPLANT
CLOSURE WOUND 1/2 X4 (GAUZE/BANDAGES/DRESSINGS) ×1
CONT SPEC 4OZ CLIKSEAL STRL BL (MISCELLANEOUS) ×6 IMPLANT
COVER BACK TABLE 24X17X13 BIG (DRAPES) IMPLANT
COVER TABLE BACK 60X90 (DRAPES) ×3 IMPLANT
DRAPE C-ARM 42X72 X-RAY (DRAPES) ×3 IMPLANT
DRAPE C-ARMOR (DRAPES) ×3 IMPLANT
DRAPE LAPAROTOMY 100X72X124 (DRAPES) ×3 IMPLANT
DRAPE POUCH INSTRU U-SHP 10X18 (DRAPES) ×3 IMPLANT
DRAPE SURG 17X23 STRL (DRAPES) ×3 IMPLANT
DRESSING TELFA 8X3 (GAUZE/BANDAGES/DRESSINGS) ×3 IMPLANT
DRSG OPSITE 4X5.5 SM (GAUZE/BANDAGES/DRESSINGS) ×3 IMPLANT
DRSG OPSITE POSTOP 4X6 (GAUZE/BANDAGES/DRESSINGS) ×3 IMPLANT
DURAPREP 26ML APPLICATOR (WOUND CARE) ×3 IMPLANT
ELECT REM PT RETURN 9FT ADLT (ELECTROSURGICAL) ×3
ELECTRODE REM PT RTRN 9FT ADLT (ELECTROSURGICAL) ×1 IMPLANT
EVACUATOR 1/8 PVC DRAIN (DRAIN) ×3 IMPLANT
GAUZE SPONGE 4X4 16PLY XRAY LF (GAUZE/BANDAGES/DRESSINGS) IMPLANT
GLOVE BIO SURGEON STRL SZ8 (GLOVE) ×6 IMPLANT
GLOVE BIOGEL PI IND STRL 7.5 (GLOVE) ×2 IMPLANT
GLOVE BIOGEL PI INDICATOR 7.5 (GLOVE) ×4
GLOVE ECLIPSE 9.0 STRL (GLOVE) ×3 IMPLANT
GLOVE SS N UNI LF 7.5 STRL (GLOVE) ×12 IMPLANT
GOWN BRE IMP SLV AUR LG STRL (GOWN DISPOSABLE) IMPLANT
GOWN BRE IMP SLV AUR XL STRL (GOWN DISPOSABLE) IMPLANT
GOWN STRL REIN 2XL LVL4 (GOWN DISPOSABLE) IMPLANT
GOWN STRL REUS W/ TWL XL LVL3 (GOWN DISPOSABLE) ×3 IMPLANT
GOWN STRL REUS W/TWL XL LVL3 (GOWN DISPOSABLE) ×6
HEMOSTAT POWDER KIT SURGIFOAM (HEMOSTASIS) IMPLANT
KIT BASIN OR (CUSTOM PROCEDURE TRAY) ×3 IMPLANT
KIT NEEDLE NVM5 EMG ELECT (KITS) ×1 IMPLANT
KIT NEEDLE NVM5 EMG ELECTRODE (KITS) ×2
KIT ROOM TURNOVER OR (KITS) ×3 IMPLANT
MILL MEDIUM DISP (BLADE) IMPLANT
NEEDLE HYPO 25X1 1.5 SAFETY (NEEDLE) ×3 IMPLANT
NS IRRIG 1000ML POUR BTL (IV SOLUTION) ×3 IMPLANT
PACK LAMINECTOMY NEURO (CUSTOM PROCEDURE TRAY) ×3 IMPLANT
PAD ARMBOARD 7.5X6 YLW CONV (MISCELLANEOUS) ×9 IMPLANT
ROD 5.5X40MM (Rod) ×6 IMPLANT
SCREW LOCK (Screw) ×8 IMPLANT
SCREW LOCK FXNS SPNE MAS PL (Screw) ×4 IMPLANT
SCREW PLIF MAS 5.0X35 (Screw) ×6 IMPLANT
SCREW SHANK 5.0X35 (Screw) ×6 IMPLANT
SCREW TULIP 5.5 (Screw) ×6 IMPLANT
SPONGE LAP 4X18 X RAY DECT (DISPOSABLE) IMPLANT
SPONGE SURGIFOAM ABS GEL 100 (HEMOSTASIS) ×3 IMPLANT
STRIP CLOSURE SKIN 1/2X4 (GAUZE/BANDAGES/DRESSINGS) ×2 IMPLANT
SUT VIC AB 0 CT1 18XCR BRD8 (SUTURE) ×1 IMPLANT
SUT VIC AB 0 CT1 8-18 (SUTURE) ×2
SUT VIC AB 2-0 CP2 18 (SUTURE) ×3 IMPLANT
SUT VIC AB 3-0 SH 8-18 (SUTURE) ×3 IMPLANT
SYR 20ML ECCENTRIC (SYRINGE) ×3 IMPLANT
SYR 3ML LL SCALE MARK (SYRINGE) IMPLANT
TAPE STRIPS DRAPE STRL (GAUZE/BANDAGES/DRESSINGS) ×3 IMPLANT
TOWEL OR 17X24 6PK STRL BLUE (TOWEL DISPOSABLE) ×3 IMPLANT
TOWEL OR 17X26 10 PK STRL BLUE (TOWEL DISPOSABLE) ×3 IMPLANT
TRAY FOLEY CATH 14FRSI W/METER (CATHETERS) ×3 IMPLANT
WATER STERILE IRR 1000ML POUR (IV SOLUTION) ×3 IMPLANT

## 2014-03-07 NOTE — Anesthesia Preprocedure Evaluation (Addendum)
Anesthesia Evaluation  Patient identified by MRN, date of birth, ID band Patient awake, Patient confused and Patient unresponsive    Reviewed: Allergy & Precautions, H&P , NPO status , Patient's Chart, lab work & pertinent test results  Airway Mallampati: I TM Distance: >3 FB Neck ROM: Full    Dental  (+) Edentulous Upper, Dental Advisory Given   Pulmonary COPDformer smoker,  breath sounds clear to auscultation        Cardiovascular hypertension, Rhythm:Regular Rate:Normal     Neuro/Psych    GI/Hepatic negative GI ROS, Neg liver ROS,   Endo/Other  negative endocrine ROS  Renal/GU Renal disease     Musculoskeletal   Abdominal   Peds  Hematology   Anesthesia Other Findings   Reproductive/Obstetrics                          Anesthesia Physical Anesthesia Plan  ASA: III  Anesthesia Plan: General   Post-op Pain Management:    Induction: Intravenous  Airway Management Planned: Oral ETT  Additional Equipment:   Intra-op Plan:   Post-operative Plan: Extubation in OR  Informed Consent: I have reviewed the patients History and Physical, chart, labs and discussed the procedure including the risks, benefits and alternatives for the proposed anesthesia with the patient or authorized representative who has indicated his/her understanding and acceptance.   Dental advisory given  Plan Discussed with: CRNA and Anesthesiologist  Anesthesia Plan Comments:         Anesthesia Quick Evaluation

## 2014-03-07 NOTE — H&P (Signed)
Subjective: Patient is a 80 y.o. male admitted for PLIF L3-4. Onset of symptoms was several months ago, gradually worsening since that time.  The pain is rated severe, and is located at the across the lower back and radiates to legs. The pain is described as aching and occurs intermittently. The symptoms have been progressive. Symptoms are exacerbated by exercise and standing. MRI or CT showed spondylolisthesis with stenosis L3-4.   Past Medical History  Diagnosis Date  . COPD (chronic obstructive pulmonary disease)   . Exposure to asbestos   . Hypertension   . PAC (premature atrial contraction)   . Hypercholesteremia   . Diverticular disease   . BPH (benign prostatic hyperplasia)   . Adenocarcinoma of prostate   . Hypogonadism male   . DJD (degenerative joint disease)   . Lumbar back pain   . Cervical spondylosis without myelopathy   . Pernicious anemia   . History of kidney stones     Past Surgical History  Procedure Laterality Date  . Appendectomy    . Surgery for removal of intercostal cyst  1997  . Lumbar laminectomy l4-5  2001    Dr. Luiz Carter  . Cts surgery on right      Dr. Amedeo Carter. Carpal tunnel tunnel  . Prostate biopsy, then brachytherapy  04/2010    Dr. Rosana Carter  . Prostate surgery    . Lung surgery      /w Dr. Arlyce Carter    Prior to Admission medications   Medication Sig Start Date End Date Taking? Authorizing Provider  amLODipine (NORVASC) 5 MG tablet Take 5 mg by mouth daily. take 1 tablet by mouth once daily 01/02/14  Yes Joseph Space, MD  aspirin 81 MG tablet Take 81 mg by mouth daily.    Yes Historical Provider, MD  cyanocobalamin (,VITAMIN B-12,) 1000 MCG/ML injection Inject 1,000 mcg into the muscle every 30 (thirty) days.   Yes Historical Provider, MD  fluocinonide-emollient (LIDEX-E) 0.05 % cream Apply 1 application topically 2 (two) times daily. 01/02/14  Yes Joseph Space, MD  Multiple Vitamins-Minerals (CENTRUM SILVER PO) Take 1 tablet by mouth daily.     Yes  Historical Provider, MD  naproxen sodium (ANAPROX) 220 MG tablet Take 220 mg by mouth 2 (two) times daily as needed (for pain).   Yes Historical Provider, MD  tamsulosin (FLOMAX) 0.4 MG CAPS capsule take 1 capsule at bedtime 01/02/14  Yes Joseph Space, MD  traMADol (ULTRAM) 50 MG tablet Take 1 tablet (50 mg total) by mouth 3 (three) times daily as needed for pain. 10/03/12  Yes Joseph Space, MD  valsartan (DIOVAN) 160 MG tablet take 1 tablet once daily 01/02/14  Yes Joseph Space, MD   Allergies  Allergen Reactions  . Lisinopril     REACTION: Hx of ACE cough...    History  Substance Use Topics  . Smoking status: Former Smoker    Quit date: 11/28/1969  . Smokeless tobacco: Never Used  . Alcohol Use: 3.0 oz/week    5 Shots of liquor per week     Comment: social use    Family History  Problem Relation Age of Onset  . Prostate cancer Father     DIED AGE 65     Review of Systems  Positive ROS: negative  All other systems have been reviewed and were otherwise negative with the exception of those mentioned in the HPI and as above.  Objective: Vital signs in last 24 hours: Temp:  [97.6  F (36.4 C)] 97.6 F (36.4 C) (04/10 7322) Pulse Rate:  [72] 72 (04/10 0614) Resp:  [20] 20 (04/10 0614) BP: (168)/(84) 168/84 mmHg (04/10 0614) SpO2:  [96 %] 96 % (04/10 0614) Weight:  [97.07 kg (214 lb)] 97.07 kg (214 lb) (04/10 0254)  General Appearance: Alert, cooperative, no distress, appears stated age Head: Normocephalic, without obvious abnormality, atraumatic Eyes: PERRL, conjunctiva/corneas clear, EOM's intact    Neck: Supple, symmetrical, trachea midline Back: Symmetric, no curvature, ROM normal, no CVA tenderness Lungs:  respirations unlabored Heart: Regular rate and rhythm Abdomen: Soft, non-tender Extremities: Extremities normal, atraumatic, no cyanosis or edema Pulses: 2+ and symmetric all extremities Skin: Skin color, texture, turgor normal, no rashes or lesions  NEUROLOGIC:    Mental status: Alert and oriented x4,  no aphasia, good attention span, fund of knowledge, and memory Motor Exam - grossly normal Sensory Exam - grossly normal Reflexes: 1+ Coordination - grossly normal Gait - grossly normal Balance - grossly normal Cranial Nerves: I: smell Not tested  II: visual acuity  OS: nl    OD: nl  II: visual fields Full to confrontation  II: pupils Equal, round, reactive to light  III,VII: ptosis None  III,IV,VI: extraocular muscles  Full ROM  V: mastication Normal  V: facial light touch sensation  Normal  V,VII: corneal reflex  Present  VII: facial muscle function - upper  Normal  VII: facial muscle function - lower Normal  VIII: hearing Not tested  IX: soft palate elevation  Normal  IX,X: gag reflex Present  XI: trapezius strength  5/5  XI: sternocleidomastoid strength 5/5  XI: neck flexion strength  5/5  XII: tongue strength  Normal    Data Review Lab Results  Component Value Date   WBC 8.2 02/27/2014   HGB 16.6 02/27/2014   HCT 46.6 02/27/2014   MCV 83.2 02/27/2014   PLT 166 02/27/2014   Lab Results  Component Value Date   NA 141 02/27/2014   K 4.8 02/27/2014   CL 106 02/27/2014   CO2 24 02/27/2014   BUN 18 02/27/2014   CREATININE 1.22 02/27/2014   GLUCOSE 116* 02/27/2014   Lab Results  Component Value Date   INR 1.09 02/27/2014    Assessment/Plan: Patient admitted for PLIF L3-4. Patient has failed a reasonable attempt at conservative therapy.  I explained the condition and procedure to the patient and answered any questions.  Patient wishes to proceed with procedure as planned. Understands risks/ benefits and typical outcomes of procedure.   Joseph Carter 03/07/2014 7:09 AM

## 2014-03-07 NOTE — Progress Notes (Signed)
UR complete.  Adyn Serna RN, MSN 

## 2014-03-07 NOTE — Transfer of Care (Signed)
Immediate Anesthesia Transfer of Care Note  Patient: Joseph Carter  Procedure(s) Performed: Procedure(s) with comments: FOR MAXIMUM ACCESS (MAS) POSTERIOR LUMBAR INTERBODY FUSION (PLIF) 1 LEVEL (N/A) - FOR MAXIMUM ACCESS (MAS) POSTERIOR LUMBAR INTERBODY FUSION (PLIF) 1 LEVEL  Patient Location: PACU  Anesthesia Type:General  Level of Consciousness: awake, alert  and oriented  Airway & Oxygen Therapy: Patient Spontanous Breathing and Patient connected to nasal cannula oxygen  Post-op Assessment: Report given to PACU RN, Post -op Vital signs reviewed and stable and Patient moving all extremities X 4  Post vital signs: Reviewed and stable  Complications: No apparent anesthesia complications

## 2014-03-07 NOTE — Progress Notes (Signed)
Patient arrived from PACU.  Patient arousable and pain is under control.  PACU RN Lauren removed his foley catheter around noon.  Patient requesting something to drink.  Patient provided water and oriented to room.  PACU RN was going to let patient's wife know that he had been transferred.  Kizzie Bane, RN

## 2014-03-07 NOTE — Anesthesia Procedure Notes (Signed)
Procedure Name: Intubation Date/Time: 03/07/2014 7:34 AM Performed by: Gaylene Brooks Pre-anesthesia Checklist: Patient identified, Timeout performed, Emergency Drugs available, Suction available and Patient being monitored Patient Re-evaluated:Patient Re-evaluated prior to inductionOxygen Delivery Method: Circle system utilized Preoxygenation: Pre-oxygenation with 100% oxygen Intubation Type: IV induction Ventilation: Mask ventilation without difficulty Laryngoscope Size: Miller and 2 Grade View: Grade I Tube type: Oral Tube size: 7.5 mm Number of attempts: 1 Airway Equipment and Method: Stylet Placement Confirmation: ETT inserted through vocal cords under direct vision,  breath sounds checked- equal and bilateral,  CO2 detector and positive ETCO2 Secured at: 22 cm Tube secured with: Tape Dental Injury: Teeth and Oropharynx as per pre-operative assessment

## 2014-03-07 NOTE — Anesthesia Postprocedure Evaluation (Signed)
  Anesthesia Post-op Note  Patient: Joseph Carter  Procedure(s) Performed: Procedure(s) with comments: FOR MAXIMUM ACCESS (MAS) POSTERIOR LUMBAR INTERBODY FUSION (PLIF) 1 LEVEL (N/A) - FOR MAXIMUM ACCESS (MAS) POSTERIOR LUMBAR INTERBODY FUSION (PLIF) 1 LEVEL  Patient Location: PACU  Anesthesia Type:General  Level of Consciousness: awake  Airway and Oxygen Therapy: Patient Spontanous Breathing  Post-op Pain: mild  Post-op Assessment: Post-op Vital signs reviewed  Post-op Vital Signs: Reviewed  Last Vitals:  Filed Vitals:   03/07/14 1015  BP: 154/75  Pulse: 93  Temp: 36.4 C  Resp: 22    Complications: No apparent anesthesia complications

## 2014-03-07 NOTE — Op Note (Signed)
03/07/2014  10:08 AM  PATIENT:  Joseph Carter  80 y.o. male  PRE-OPERATIVE DIAGNOSIS:  Degenerative spondylolisthesis with severe spinal stenosis L3-4 with back and leg pain  POST-OPERATIVE DIAGNOSIS:  Same  PROCEDURE:   1. Decompressive lumbar laminectomy L3-4 requiring more work than would be required for a simple exposure of the disk for PLIF in order to adequately decompress the neural elements and address the spinal stenosis 2. Posterior lumbar interbody fusion L3-4 using PEEK interbody cages packed with morcellized allograft and autograft 3. Posterior fixation L3-4 using cortical pedicle screws.    SURGEON:  Sherley Bounds, MD  ASSISTANTS: Dr. Annette Stable  ANESTHESIA:  General  EBL: 100  ml  Total I/O In: 1000 [I.V.:1000] Out: 265 [Urine:165; Blood:100]  BLOOD ADMINISTERED:none  DRAINS: Hemovac   INDICATION FOR PROCEDURE: This patient presented with a long history of back and bilateral leg pain. He underwent a previous surgery at L4-5. He's had a long-standing spondylolisthesis at L3-4 with spinal stenosis. His symptoms were progressive to the point that it interfered with his quality of life and activities of daily living. He failed a reasonable attempt at best medical management. I recommended a decompression if she and her fusion to address his spinal stenosis and segmental instability. Patient understood the risks, benefits, and alternatives and potential outcomes and wished to proceed.  PROCEDURE DETAILS:  The patient was brought to the operating room. After induction of generalized endotracheal anesthesia the patient was rolled into the prone position on chest rolls and all pressure points were padded. The patient's lumbar region was cleaned and then prepped with DuraPrep and draped in the usual sterile fashion. Anesthesia was injected and then a dorsal midline incision was made and carried down to the lumbosacral fascia. The fascia was opened and the paraspinous musculature was  taken down in a subperiosteal fashion to expose L3-4. A self-retaining retractor was placed. Intraoperative fluoroscopy confirmed my level, and I started with placement of the L3 cortical pedicle screws. The pedicle screw entry zones were identified utilizing surface landmarks and  AP and lateral fluoroscopy. I scored the cortex with the high-speed drill and then used the hand drill and EMG monitoring to drill an upward and outward direction into the pedicle. I then tapped line to line, and the tap was also monitored. I then placed a 5-0 x 35 mm cortical pedicle screw into the pedicles of L3 bilaterally. I then turned my attention to the decompression and the spinous process was removed and complete lumbar laminectomies, hemi- facetectomies, and foraminotomies were performed at L3-4. The patient had significant spinal stenosis and this required more work than would be required for a simple exposure of the disc for posterior lumbar interbody fusion. Much more generous decompression was undertaken in order to adequately decompress the neural elements and address the patient's leg pain. The yellow ligament was removed to expose the underlying dura and nerve roots, and generous foraminotomies were performed to adequately decompress the neural elements. Both the exiting and traversing nerve roots were decompressed on both sides until a coronary dilator passed easily along the nerve roots. Once the decompression was complete, I turned my attention to the posterior lower lumbar interbody fusion. The epidural venous vasculature was coagulated and cut sharply. Disc space was incised and the initial discectomy was performed with pituitary rongeurs. The disc space was distracted with sequential distractors to a height of millimeters. We then used a series of scrapers and shavers to prepare the endplates for fusion. The midline was  prepared with Epstein curettes. Once the complete discectomy was finished, we packed an  appropriate sized peek interbody cage with local autograft and morcellized allograft, gently retracted the nerve root, and tapped the cage into position at L3-4.  The midline between the cages was packed with morselized autograft and allograft. We then turned our attention to the placement of the lower pedicle screws. The pedicle screw entry zones were identified utilizing surface landmarks and fluoroscopy. I drilled into each pedicle utilizing the hand drill and EMG monitoring, and tapped each pedicle with the appropriate tap. We palpated with a ball probe to assure no break in the cortex. We then placed 5-0 x 35 mm cortical pedicle screws into the pedicles bilaterally at L4.   We then placed lordotic rods into the multiaxial screw heads of the pedicle screws and locked these in position with the locking caps and anti-torque device. We then checked our construct with AP and lateral fluoroscopy. Irrigated with copious amounts of bacitracin-containing saline solution. Placed a medium Hemovac drain through separate stab incision. Inspected the nerve roots once again to assure adequate decompression, lined to the dura with Gelfoam, and closed the muscle and the fascia with 0 Vicryl. Closed the subcutaneous tissues with 2-0 Vicryl and subcuticular tissues with 3-0 Vicryl. The skin was closed with benzoin and Steri-Strips. Dressing was then applied, the patient was awakened from general anesthesia and transported to the recovery room in stable condition. At the end of the procedure all sponge, needle and instrument counts were correct.   PLAN OF CARE: Admit to inpatient   PATIENT DISPOSITION:  PACU - hemodynamically stable.   Delay start of Pharmacological VTE agent (>24hrs) due to surgical blood loss or risk of bleeding:  yes

## 2014-03-08 MED ORDER — TRAMADOL HCL 50 MG PO TABS: 50.0000 mg | ORAL_TABLET | Freq: Four times a day (QID) | ORAL | Status: DC | PRN

## 2014-03-08 MED ORDER — DIAZEPAM 5 MG PO TABS: 5.0000 mg | ORAL_TABLET | Freq: Four times a day (QID) | ORAL | Status: DC | PRN

## 2014-03-08 MED ORDER — OXYCODONE-ACETAMINOPHEN 5-325 MG PO TABS: 1.0000 | ORAL_TABLET | ORAL | Status: DC | PRN

## 2014-03-08 NOTE — Evaluation (Addendum)
Occupational Therapy Evaluation Patient Details Name: Joseph Carter MRN: [id number] DOB: 03/07/1932 Today's Date: 03/08/2014    History of Present Illness 80 y.o. s/p FOR MAXIMUM ACCESS (MAS) POSTERIOR LUMBAR INTERBODY FUSION (PLIF) 1 LEVEL (N/A) - FOR MAXIMUM ACCESS (MAS) POSTERIOR LUMBAR INTERBODY FUSION (PLIF) 1 LEVEL   Clinical Impression   Pt presents with below problem list. Pt independent with ADLs, PTA. Feel pt will benefit from acute OT to increase independence prior to d/c.     Follow Up Recommendations  No OT follow up;Supervision - Intermittent    Equipment Recommendations  3 in 1 bedside comode;Other (comment) (AE)    Recommendations for Other Services       Precautions / Restrictions Precautions Precautions: Back;Fall Precaution Booklet Issued: Yes (comment) Precaution Comments: Reviewed precautions with pt Required Braces or Orthoses: Spinal Brace Spinal Brace: Lumbar corset;Applied in sitting position Restrictions Weight Bearing Restrictions: No      Mobility Bed Mobility Overal bed mobility: Needs Assistance Bed Mobility: Rolling;Sit to Sidelying Rolling: Supervision       Sit to sidelying: Supervision General bed mobility comments: Cues for technique.  Transfers Overall transfer level: Needs assistance Equipment used: Rolling walker (2 wheeled); none Transfers: Sit to/from Stand Sit to Stand: Min guard         General transfer comment: Cues for technique.    Balance                                            ADL Overall ADL's : Needs assistance/impaired     Grooming: Set up;Supervision/safety;Sitting           Upper Body Dressing : Set up;Supervision/safety;Sitting   Lower Body Dressing: Minimal assistance;Sit to/from stand;Cueing for back precautions;With adaptive equipment   Toilet Transfer: Min guard;Ambulation;Comfort height toilet;Grab bars;RW       Tub/ Shower Transfer: Min guard;Ambulation    Functional mobility during ADLs: Min guard;Rolling walker (ambulated with and without walker) General ADL Comments: Educated on AE for LB ADLs. Pt practiced with sockaid. Educated on use of cup for teeth care. Pt practiced donning/doffing back brace. Discussed toilet aid for hygiene if needed.      Vision                     Perception     Praxis      Pertinent Vitals/Pain Pain 2/10. Increased activity during session.      Hand Dominance     Extremity/Trunk Assessment Upper Extremity Assessment Upper Extremity Assessment: Overall WFL for tasks assessed   Lower Extremity Assessment Lower Extremity Assessment: Defer to PT evaluation       Communication Communication Communication: No difficulties   Cognition Arousal/Alertness: Awake/alert Behavior During Therapy: WFL for tasks assessed/performed Overall Cognitive Status: Within Functional Limits for tasks assessed                     General Comments       Exercises       Shoulder Instructions      Home Living Family/patient expects to be discharged to:: Private residence Living Arrangements: Spouse/significant other Available Help at Discharge: Family;Available 24 hours/day Type of Home: House Home Access: Stairs to enter [employer] of Steps: 4 Entrance Stairs-Rails: Right Home Layout: One level     Bathroom Shower/Tub: Teacher, early years/pre: Handicapped height (sink close)  Home Equipment: Kasandra Knudsen - single point;Walker - 2 wheels;Electric scooter;Adaptive equipment Adaptive Equipment: Reacher        Prior Functioning/Environment Level of Independence: Independent with assistive device(s)             OT Diagnosis: Acute pain   OT Problem List: Decreased knowledge of use of DME or AE;Decreased knowledge of precautions;Pain;Decreased strength   OT Treatment/Interventions: Self-care/ADL training;DME and/or AE instruction;Therapeutic  activities;Patient/family education;Balance training    OT Goals(Current goals can be found in the care plan section) Acute Rehab OT Goals Patient Stated Goal: not stated OT Goal Formulation: With patient Time For Goal Achievement: 03/15/14 Potential to Achieve Goals: Good ADL Goals Pt Will Perform Grooming: with modified independence;standing Pt Will Perform Lower Body Dressing: with modified independence;with adaptive equipment;sit to/from stand Pt Will Transfer to Toilet: with modified independence;ambulating;grab bars (comfort height toilet) Pt Will Perform Toileting - Clothing Manipulation and hygiene: with modified independence;sit to/from stand Pt Will Perform Tub/Shower Transfer: Tub transfer;with supervision;ambulating (shower equipment tbd) Additional ADL Goal #1: Pt will independently verbalize and demonstrate 3/3 back precautions during ADLs and functional mobility.   OT Frequency: Min 2X/week   Barriers to D/C:            Co-evaluation              End of Session Equipment Utilized During Treatment: Gait belt;Rolling walker;Back brace Nurse Communication: Other (comment) (spoke with nursing student;pt back in bed)  Activity Tolerance: Patient tolerated treatment well Patient left: in bed;with call bell/phone within reach;with bed alarm set   Time: 9767-3419 OT Time Calculation (min): 25 min Charges:  OT General Charges $OT Visit: 1 Procedure OT Evaluation $Initial OT Evaluation Tier I: 1 Procedure OT Treatments $Self Care/Home Management : 8-22 mins G-Codes:    Benito Mccreedy OTR/L [phone] 03/08/2014, 10:31 AM

## 2014-03-08 NOTE — Progress Notes (Signed)
Subjective: Patient reports doing well  Objective: Vital signs in last 24 hours: Temp:  [97.3 F (36.3 C)-98.6 F (37 C)] 98.6 F (37 C) (04/11 0950) Pulse Rate:  [68-87] 69 (04/11 0950) Resp:  [14-20] 18 (04/11 0950) BP: (99-136)/(58-76) 119/58 mmHg (04/11 0950) SpO2:  [93 %-98 %] 98 % (04/11 0950) Weight:  [99.701 kg (219 lb 12.8 oz)] 99.701 kg (219 lb 12.8 oz) (04/10 1633)  Intake/Output from previous day: 04/10 0701 - 04/11 0700 In: 1940 [P.O.:240; I.V.:1700] Out: 870 [Urine:615; Drains:155; Blood:100] Intake/Output this shift: Total I/O In: -  Out: 100 [Drains:100]  Physical Exam: Full strength.  Up in brace.  Lab Results: No results found for this basename: WBC, HGB, HCT, PLT,  in the last 72 hours BMET No results found for this basename: NA, K, CL, CO2, GLUCOSE, BUN, CREATININE, CALCIUM,  in the last 72 hours  Studies/Results: Dg Lumbar Spine 2-3 Views  03/07/2014   CLINICAL DATA:  Lumbar spine fusion  EXAM: DG C-ARM 1-60 MIN; LUMBAR SPINE - 2-3 VIEW  : COMPARISON:  02/10/2014  FINDINGS: Two submitted images show placement of bilateral pedicle screws and interconnecting rods at the L3-L4 levels. There is a radiolucent disc spacer maintaining disc space height. The orthopedic hardware is well seated. There is no acute fracture or evidence of an operative complication.  IMPRESSION: L3-L4 posterior lumbar spine fusion. Please refer to the procedure report for a complete description.   Electronically Signed   By: Lajean Manes M.D.   On: 03/07/2014 10:34   Dg C-arm 1-60 Min  03/07/2014   CLINICAL DATA:  Lumbar spine fusion  EXAM: DG C-ARM 1-60 MIN; LUMBAR SPINE - 2-3 VIEW  : COMPARISON:  02/10/2014  FINDINGS: Two submitted images show placement of bilateral pedicle screws and interconnecting rods at the L3-L4 levels. There is a radiolucent disc spacer maintaining disc space height. The orthopedic hardware is well seated. There is no acute fracture or evidence of an operative  complication.  IMPRESSION: L3-L4 posterior lumbar spine fusion. Please refer to the procedure report for a complete description.   Electronically Signed   By: Lajean Manes M.D.   On: 03/07/2014 10:34    Assessment/Plan: Doing well.  D/C drain.  D/C home.    LOS: 1 day    Erline Levine, MD 03/08/2014, 11:27 AM

## 2014-03-08 NOTE — Evaluation (Signed)
Physical Therapy Evaluation Patient Details Name: Joseph Carter MRN: [id number] DOB: Nov 21, 1932 Today's Date: 03/08/2014   History of Present Illness  80 y.o. s/p FOR MAXIMUM ACCESS (MAS) POSTERIOR LUMBAR INTERBODY FUSION (PLIF) 1 LEVEL (N/A) - FOR MAXIMUM ACCESS (MAS) POSTERIOR LUMBAR INTERBODY FUSION (PLIF) 1 LEVEL  Clinical Impression  Patient presents with problems listed below.  Did well with mobility and gait today.  Would like to practice stairs at next visit.  No f/u PT or equipment needed at discharge.    Follow Up Recommendations No PT follow up;Supervision/Assistance - 24 hour    Equipment Recommendations  None recommended by PT    Recommendations for Other Services       Precautions / Restrictions Precautions Precautions: Back Precaution Booklet Issued: Yes (comment) Precaution Comments: Reviewed precautions with patient and wife. Required Braces or Orthoses: Spinal Brace Spinal Brace: Lumbar corset;Applied in sitting position Restrictions Weight Bearing Restrictions: No      Mobility  Bed Mobility Overal bed mobility: Needs Assistance Bed Mobility: Rolling;Sit to Sidelying Rolling: Supervision       Sit to sidelying: Supervision General bed mobility comments: Cues for technique.  Transfers Overall transfer level: Needs assistance Equipment used: None Transfers: Sit to/from Stand Sit to Stand: Supervision         General transfer comment: Cues for technique.  Ambulation/Gait Ambulation/Gait assistance: Min guard Ambulation Distance (Feet): 120 Feet Assistive device: None Gait Pattern/deviations: Step-through pattern;Decreased stride length;Staggering right Gait velocity: Decreased Gait velocity interpretation: Below normal speed for age/gender General Gait Details: Patient with slight decrease in balance due to LE muscle fatigue/soreness.  Did stagger to right x2 during gait - able to self-correct.  Stairs            Wheelchair  Mobility    Modified Rankin (Stroke Patients Only)       Balance                                             Pertinent Vitals/Pain     Home Living Family/patient expects to be discharged to:: Private residence Living Arrangements: Spouse/significant other Available Help at Discharge: Family;Available 24 hours/day Type of Home: House Home Access: Stairs to enter Entrance Stairs-Rails: Right Entrance Stairs-Number of Steps: 4 Home Layout: One level Home Equipment: Cane - single point;Walker - 2 wheels;Electric scooter;Adaptive equipment      Prior Function Level of Independence: Independent with assistive device(s)         Comments: Used scooter at times for longer distances due to pain and fatigue with ambulation     Hand Dominance        Extremity/Trunk Assessment   Upper Extremity Assessment: Overall WFL for tasks assessed           Lower Extremity Assessment: Generalized weakness         Communication   Communication: No difficulties  Cognition Arousal/Alertness: Awake/alert Behavior During Therapy: WFL for tasks assessed/performed Overall Cognitive Status: Within Functional Limits for tasks assessed                      General Comments      Exercises        Assessment/Plan    PT Assessment Patient needs continued PT services  PT Diagnosis Abnormality of gait;Generalized weakness;Acute pain   PT Problem List Decreased strength;Decreased balance;Decreased mobility;Decreased knowledge of precautions;Pain  PT Treatment Interventions Gait training;Stair training;Functional mobility training;Patient/family education   PT Goals (Current goals can be found in the Care Plan section) Acute Rehab PT Goals Patient Stated Goal: To go home soon PT Goal Formulation: With patient/family Time For Goal Achievement: 03/15/14 Potential to Achieve Goals: Good    Frequency Min 5X/week   Barriers to discharge         Co-evaluation               End of Session Equipment Utilized During Treatment: Gait belt;Back brace Activity Tolerance: Patient limited by fatigue;Patient limited by pain Patient left: in chair;with call bell/phone within reach;with family/visitor present Nurse Communication: Mobility status         Time: 0160-1093 PT Time Calculation (min): 15 min   Charges:   PT Evaluation $Initial PT Evaluation Tier I: 1 Procedure PT Treatments $Gait Training: 8-22 mins   PT G Codes:          Despina Pole 2014-03-15, 1:08 PM Carita Pian. Sanjuana Kava, [place] Pager [phone]

## 2014-03-08 NOTE — Progress Notes (Signed)
Discharge orders received, pt for discharge home today, IV and hemovac D/C with dressing CDI to lower back.  D/C instructions and Rx given with verbalized understanding.  Family at bedside to assist pt with discharge. Staff brought pt downstairs via wheelchair.

## 2014-03-08 NOTE — Discharge Summary (Signed)
Physician Discharge Summary  Patient ID: Joseph Carter MRN: [id number] DOB/AGE: 06-07-1932 80 y.o.  Admit date: 03/07/2014 Discharge date: 03/08/2014  Admission Diagnoses:Lumbar spinal stenosis L 34 with spondylolisthesis, lumbago and radiculopathy  Discharge Diagnoses: Lumbar spinal stenosis L 34 with spondylolisthesis, lumbago and radiculopathy Active Problems:   S/P lumbar spinal fusion   Discharged Condition: good  Hospital Course: Patient underwent decompression and fusion L 34 level and did well postoperatively.    Consults: None  Significant Diagnostic Studies: None  Treatments: surgery:decompression and fusion L 34 level  Discharge Exam: Blood pressure 119/58, pulse 69, temperature 98.6 F (37 C), temperature source Oral, resp. rate 18, height 6\' 4"  (1.93 m), weight 99.701 kg (219 lb 12.8 oz), SpO2 98.00%. Neurologic: Alert and oriented X 3, normal strength and tone. Normal symmetric reflexes. Normal coordination and gait Wound:CDI  Disposition: Home     Medication List    STOP taking these medications       naproxen sodium 220 MG tablet  Commonly known as:  ANAPROX      TAKE these medications       amLODipine 5 MG tablet  Commonly known as:  NORVASC  Take 5 mg by mouth daily. take 1 tablet by mouth once daily     aspirin 81 MG tablet  Take 81 mg by mouth daily.     CENTRUM SILVER PO  Take 1 tablet by mouth daily.     cyanocobalamin 1000 MCG/ML injection  Commonly known as:  (VITAMIN B-12)  Inject 1,000 mcg into the muscle every 30 (thirty) days.     diazepam 5 MG tablet  Commonly known as:  VALIUM  Take 1 tablet (5 mg total) by mouth every 6 (six) hours as needed for muscle spasms.     fluocinonide-emollient 0.05 % cream  Commonly known as:  LIDEX-E  Apply 1 application topically 2 (two) times daily.     oxyCODONE-acetaminophen 5-325 MG per tablet  Commonly known as:  PERCOCET/ROXICET  Take 1-2 tablets by mouth every 4 (four) hours as needed  for moderate pain or severe pain.     tamsulosin 0.4 MG Caps capsule  Commonly known as:  FLOMAX  take 1 capsule at bedtime     traMADol 50 MG tablet  Commonly known as:  ULTRAM  Take 1 tablet (50 mg total) by mouth 3 (three) times daily as needed for pain.     traMADol 50 MG tablet  Commonly known as:  ULTRAM  Take 1 tablet (50 mg total) by mouth every 6 (six) hours as needed for moderate pain.     valsartan 160 MG tablet  Commonly known as:  DIOVAN  take 1 tablet once daily         Signed: Erline Levine, MD 03/08/2014, 11:38 AM

## 2014-03-10 ENCOUNTER — Encounter (HOSPITAL_COMMUNITY): Payer: Self-pay | Admitting: Neurological Surgery

## 2014-03-14 ENCOUNTER — Ambulatory Visit (INDEPENDENT_AMBULATORY_CARE_PROVIDER_SITE_OTHER): Payer: Medicare Other | Admitting: Adult Health

## 2014-03-14 ENCOUNTER — Telehealth: Payer: Self-pay | Admitting: Pulmonary Disease

## 2014-03-14 ENCOUNTER — Ambulatory Visit (INDEPENDENT_AMBULATORY_CARE_PROVIDER_SITE_OTHER)
Admission: RE | Admit: 2014-03-14 | Discharge: 2014-03-14 | Disposition: A | Discharge status: Home / Self Care | Payer: Medicare Other | Source: Ambulatory Visit | Attending: Adult Health | Admitting: Adult Health

## 2014-03-14 ENCOUNTER — Encounter: Payer: Self-pay | Admitting: Adult Health

## 2014-03-14 VITALS — BP 130/84 | HR 102 | Temp 99.8°F

## 2014-03-14 DIAGNOSIS — R05 Cough: Secondary | ICD-10-CM

## 2014-03-14 DIAGNOSIS — R059 Cough, unspecified: Secondary | ICD-10-CM

## 2014-03-14 DIAGNOSIS — J449 Chronic obstructive pulmonary disease, unspecified: Secondary | ICD-10-CM

## 2014-03-14 MED ORDER — LEVOFLOXACIN 500 MG PO TABS: 500.0000 mg | ORAL_TABLET | Freq: Every day | ORAL | Status: AC

## 2014-03-14 MED ORDER — BENZONATATE 200 MG PO CAPS: 200.0000 mg | ORAL_CAPSULE | Freq: Three times a day (TID) | ORAL | Status: DC | PRN

## 2014-03-14 NOTE — Progress Notes (Signed)
Subjective:     Patient ID: Joseph Carter, male   DOB: 1932/08/22, 80 y.o.   MRN: [id number]  HPI 80 y/o WM here for a follow up visit... he has multiple medical problems as noted below...    ~  December 06, 2011:  Yearly Joseph Carter continues to do well w/o new complaints or concerns> See Prob List below>>  ~  October 03, 2012:  42mo ROV & Joseph Carter presents w/ a 66mo hx of pain in the abd> initially seen at [facility]) & told he had gallstones (we don't have these records or the scan report); he recently saw Joseph Carter for his regular Urology f/u & he rec that Joseph Carter f/u w/ [name] because it sounded more like diverticulitis; pain has been occurring off&on since June, predom left sided (LUQ & flank), pains lasts several days & seems to resolve on it's own, then returns, denies n/v/d but has some constip & notes ?intermit relief w/ BM; pt had Colonoscopy by Joseph Carter 1/13- 16mm polyp in desc colon (tubular adenoma), mod diverticulosis in sigmoid, radiation proctitis in rectum;  He also had appendectomy at age 72;  Denies dysuria, hematuria, or colicky renal pain...  We reviewed the need for Labs & Carter Abd&Pelvis- see below...    MildCOPD/ HxAsbestos exposure/ s/p resection of intercostal cyst 1997> breathing is good, no cough/ phlegm/ hemoptysis/ CP/ dyspnea/ etc...    HBP> controlled on Amlodip5, Diovan160; BP= 140/78 & denies CP, palpit, SOB, edema, etc...    Chol> on diet alone & labs 1/13 ok x LDL=120    Hx prostate cancer/ LUTS> s/p brachytherapy per Joseph Carter 2011 & followed by Joseph Carter on Flomax0.4 & Vesicare5; pt tells me his PSA has been falling & when last measured by Urology ~0.3    DJD, LBP, Cx Spondylosis> all followed by Joseph Carter; he is very stoic & not taking pain meds...    PA> on B12 shots monthly... We reviewed prob list, meds, xrays and labs> see below for updates >>  LABS 10/03/12:  Chems- ok x BUN=25 Creat=1.5 K=5.5;  CBC- wnl;  TSH=1.84;  Sed=16;  UA= trace heme, few rbc/wbc/bact... Carter  Abd&Pelvis 10/05/12 showed several distal left ureteral calculi- 2cm above UVJ, largest 5.25mm size, no hydronephrosis therefore NON-obstructing at present;  Mult other findings on the Carter in this 80 y/o man> basilar atx/ scarring, coronary & abd ao/ iliac calcif, bilat renal cysts, mult layering gallstones, 1.7cm right adrenal adenoma, sigmoid diverticulosis w/o inflamm, intraprostatic therapy seeds, no adenopathy, sm fat containing LIH, degen changes in spine...   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     We reviewed prob list, meds, xrays and labs> see below for updates >> he had the 2014 Flu vaccine in Oct... CXR 2/15 showed borderline cardiomeg, metallic clips and old right rib fractures, pleuroparenchymal scarring, NAD.Marland KitchenMarland Kitchen EKG 2/15 showed [place], [place], old IWMI, NAD... LABS 2/15:  FLP- at goals x LDL=122 on diet rx;  Chems- ok x Cr=1.4;  CBC- wnl;  TSH=2.46;  PSA=0.13;  B12=765...    03/14/2014 Acute OV  Complains of  cough occasionally producing white mucus, fever up to 101, decreased appetite x4-5days.  Denies chest pain, orthopnea, n/v/d, edema or hemoptysis  No palpitation , calf pain or increased leg swelling.  Had Lumbar Fusion on 03/07/14. (spinal stenosis, L3-4 ) Still having back pain , coughing is hurting his back . Taking pain meds from surgeon.  CXR today shows no acute process.          Problem List:  COPD (Joseph Carter) - ex-smoker (quit~1980) w/ hx COPD... no active symptoms... doing satis, no meds, no recent exac- denies SOB, CP, edema, etc- min AM cough/ no phlegm/ no blood etc...  HX, PERSONAL, EXPOSURE TO ASBESTOS (ICD-V15.84)  - he had right sided pleural based mass resected via VATS 1997 that was an intercostal cyst... ~  CXR 1/12 showed s/p surg on right, chr changes, NAD.[name]. ~  CXR 1/13 showed clear lungs, post surg changes on right w/o interval change, NAD.[name]. ~  CXR 2/15 showed borderline cardiomeg, metallic clips and old right rib fractures, pleuroparenchymal scarring, NAD...  HYPERTENSION (ICD-401.9) - on ASA 81mg /d, NORVASC 5mg /d and DIOVAN 160mg /d...  ~  + FamHx CAD- Bro w/ CABG... ~  2DEcho 9/98 showed mild LVH, norm LVF, mild AI... ~  1/12:  BP= 144/82 but all 130's/ 80's at home; denies HA, fatigue, visual changes, CP, palipit, dizziness, syncope, dyspnea, edema, etc... ~  1/13:  BP= 138/76 & he remains essentially asymptomatic... ~  2/15: on Amlod5 & Diovan160;  BP= 148/60 & he denies CP, palpit, SOB, edema, etc...  Hx of PAC (ICD-427.61) - neg cardiac eval 1998... he takes ASA 81mg /d...  HYPERCHOLESTEROLEMIA, BORDERLINE (ICD-272.4) ~  FLP 9/07 showed TChol 161, TG 54, HDL 33, LDL 117... on diet + exercise Rx... ~  [place] 5/09 showed TChol 155, TG 73, HDL 28, LDL 112 ~  FLP 6/10 showed TChol 160, TG 82, HDL 34, LDL 110 ~  FLP 1/12 showed TChol 159, TG 86, HDL 33, LDL 109 ~  FLP 1/13 on diet alone showed TChol 171, TG 68, HDL 37, LDL 120 ~  FLP 2/15 on diet alone showed TChol 177, TG 100, HDL 35, LDL 122  DIVERTICULAR DISEASE (ICD-562.10) - colonoscopy 4/03 by Joseph Carter showed divertics only... f/u rec 10 yrs. ~  1/13: f/u colonoscopy is due 4/13...  BENIGN PROSTATIC HYPERTROPHY, HX OF (ICD-V13.8) ADENOCARCINOMA, PROSTATE (ICD-185) HYPOGONADISM (ICD-257.2) - prev hx of intol to Doxazosin, therefore changed to Joseph Carter 0.4mg /d but states no benefit to his decr stream so he stopped it; only mild LTOS & denies incontinence, freq, nocturia... ~  labs 4/08 showed PSA= 2.91 ~  labs 5/09 showed PSA= 3.58 ~  labs 6/10 showed PSA= 4.31 (note: Testosterone level= 255)... refer to Urology, DrDavis ==> & eventual Dx  Prostate Cancer & he decided on Brachytherapy per DrManning  (done 6/11)... ~  pt tells me he continues to follow up w/ DrDavis Q49mo but we don't have notes from him... ~  1/13: pt reports that DrDavis started [place] and that the combination is helping his urination; last seen 9/12 & he reports that PSA was "ok"... ~  11/13:  Pt reports that his PSA is down to 0.3 per Urology; NOTE> labs here showed BUN=25 Creat=1.5 K=5.5  ~  2/15:  ?when he last saw Urology, [facility] (we do not have notes from them); he requests labs to be done here today> PSA=0.13, Cr=1.4.Marland KitchenMarland Kitchen  Left URETERAL STONES/  Bilat RENAL CYSTS >> ~  11/13:  Pt presented w/ 64mo hx  intermittent left sided abd / flank pain & eval from [facility] showing gallstones; DrDavis referred pt here for further eval of what sounded like diverticulitis but Carter scan showed several distal ureteral stones ~2cm above the UVJ w/o hydronephrosis suggesting non-obstructive stones (largest= 5.32mm)- see above for mult other Carter findings including bilat renal cysts, right adrenal adenoma, & mult sm layering gallstones;  We increased his Flomax0.4mg - take 2Qhs for medical expulsive therapy & referred him back to Urology... ~  2/15:  On Flomax0.4 & Vesicare5; Pt reports that he saw Urology after last OV, agreed w/ incr Flomax 0.4mg - 2Qhs; pt thinks he passed 1-2 stones, & not further problems reported...  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  BACK PAIN, LUMBAR (ICD-724.2) - s/p L4-5 laminectomy and microdiscectomy 5/01 by Tor Netters- pt is reluctant to return since he was told he'd need rods & doesn't want them; he tried shots yrs ago w/o benefit...  CERVICAL SPONDYLOSIS WITHOUT MYELOPATHY (ICD-721.0) - he also has known CSpine spondylosis and foramenal stenosis- followed by Joseph Carter...  ANEMIA, PERNICIOUS (ICD-281.0) - on B 12 shots monthly... ~   labs 5/09 showed B12= 602 ~  labs 6/10 showed B12= >1500 ~  labs 1/12 showed Hg= 15.9, B12= 497 ~  Labs 1/13 showed Hg= 16.7, B12 level >1500 & he tells me he takes B12 tabs Bid as well (he likes it that way). ~  Labs 11/13 showed Hg= 15.2 ~  Labs 2/15 on B12 shots monthly showed Hg= 16.4, MCV=88, B12 level= 765...   Past Surgical History  Procedure Laterality Date  . Appendectomy    . Surgery for removal of intercostal cyst  1997  . Lumbar laminectomy l4-5  2001    Dr. Luiz Ochoa  . Cts surgery on right      Dr. Amedeo Plenty. Carpal tunnel tunnel  . Prostate biopsy, then brachytherapy  04/2010    Dr. Rosana Hoes  . Prostate surgery    . Lung surgery      /w Dr. Arlyce Dice  . Lumbar laminectomy  03/07/2014    L3 L4   DR Ronnald Ramp  . Maximum access (mas)posterior lumbar interbody fusion (plif) 1 level N/A 03/07/2014    Procedure: FOR MAXIMUM ACCESS (MAS) POSTERIOR LUMBAR INTERBODY FUSION (PLIF) 1 LEVEL;  Surgeon: [name], MD;  Location: [facility];  Service: Neurosurgery;  Laterality: N/A;  FOR MAXIMUM ACCESS (MAS) POSTERIOR LUMBAR INTERBODY FUSION (PLIF) 1 LEVEL    Outpatient Encounter Prescriptions as of 03/14/2014  Medication Sig  . amLODipine (NORVASC) 5 MG tablet Take 5 mg by mouth daily. take 1 tablet by mouth once daily  . aspirin 81 MG tablet Take 81 mg by mouth daily.   . cyanocobalamin (,VITAMIN B-12,) 1000 MCG/ML injection Inject 1,000 mcg into the muscle every 30 (thirty) days.  . diazepam (VALIUM) 5 MG tablet Take 1 tablet (5 mg total) by mouth every 6 (six) hours as needed for muscle spasms.  . fluocinonide-emollient (LIDEX-E) 0.05 % cream Apply 1 application topically 2 (two) times daily.  . Multiple Vitamins-Minerals (CENTRUM SILVER PO) Take 1 tablet by mouth daily.    [name] oxyCODONE-acetaminophen (PERCOCET/ROXICET) 5-325 MG per tablet Take 1-2 tablets by mouth every 4 (four) hours as needed for moderate pain or severe pain.  . tamsulosin (FLOMAX) 0.4 MG CAPS capsule take 1 capsule at  bedtime  . traMADol (ULTRAM) 50 MG tablet Take 1  tablet (50 mg total) by mouth every 6 (six) hours as needed for moderate pain.  . valsartan (DIOVAN) 160 MG tablet take 1 tablet once daily  . [DISCONTINUED] traMADol (ULTRAM) 50 MG tablet Take 1 tablet (50 mg total) by mouth 3 (three) times daily as needed for pain.    Allergies  Allergen Reactions  . Lisinopril     REACTION: Hx of ACE cough...    Current Medications, Allergies, Past Medical History, Past Surgical History, Family History, and Social History were reviewed in [employer] record.     Review of Systems Constitutional:   No  weight loss, night sweats,  Fevers, chills,  +fatigue, or  lassitude.  HEENT:   No headaches,  Difficulty swallowing,  Tooth/dental problems, or  Sore throat,                No sneezing, itching, ear ache, + nasal congestion, post nasal drip,   CV:  No chest pain,  Orthopnea, PND, swelling in lower extremities, anasarca, dizziness, palpitations, syncope.   GI  No heartburn, indigestion, abdominal pain, nausea, vomiting, diarrhea, change in bowel habits,  +loss of appetite  Resp:  .  No chest wall deformity  Skin: no rash or lesions.  GU: no dysuria, change in color of urine, no urgency or frequency.  No flank pain, no hematuria   MS:  No joint pain or swelling.  No decreased range of motion.  No back pain.  Psych:  No change in mood or affect. No depression or anxiety.  No memory loss.       Objective:   Physical Exam    WD, WN, 80 y/o WM in NAD in wheelchair  GENERAL:  Alert & oriented; pleasant & cooperative... HEENT:  [place]/AT, , EACs-clear, TMs-wnl, NOSE-clear, THROAT-clear & wnl. NECK:  Supple w/ fairROM; no JVD; normal carotid impulses w/o bruits; no thyromegaly or nodules palpated; no lymphadenopathy. CHEST:  Few rhonchi , no wheezing  HEART:  Regular Rhythm; without murmurs/ rubs/ or gallops detected... ABDOMEN:  Soft & nontender; normal bowel sounds; no  organomegaly or masses palpated... EXT: without deformities, mild arthritic changes; no varicose veins/ venous insuffic/ tr  edema. NEURO:    no focal neuro deficits... DERM:  No lesions noted; no rash etc...    Assessment:

## 2014-03-14 NOTE — Telephone Encounter (Signed)
Spoke w/ spouse. appt scheduled to come in and see TP this afternoon. Nothing further needed

## 2014-03-14 NOTE — Patient Instructions (Signed)
Levaquin 500mg  daily for 7 days  Mucinex DM Twice daily  For cough/congestion  Push fluids and rest .  Tessalon Three times a day  As needed  Cough  Please contact office for sooner follow up if symptoms do not improve or worsen or seek emergency care  Dr. Lenna Gilford is retiring and we will need to set you up with a new Primary Care provider.  Please let us know if you would like [name] to assist with this referral.

## 2014-03-18 NOTE — Assessment & Plan Note (Signed)
Flare with bronchitis  cxr w/ no acute process  Will hold on steroids as recent back surgery  Plan  Levaquin 500mg  daily for 7 days  Mucinex DM Twice daily  For cough/congestion  Push fluids and rest .  Tessalon Three times a day  As needed  Cough  Please contact office for sooner follow up if symptoms do not improve or worsen or seek emergency care  Dr. Lenna Gilford is retiring and we will need to set you up with a new Primary Care provider.  Please let us know if you would like [name] to assist with this referral.

## 2014-03-25 ENCOUNTER — Ambulatory Visit (INDEPENDENT_AMBULATORY_CARE_PROVIDER_SITE_OTHER): Payer: Medicare Other

## 2014-03-25 DIAGNOSIS — D510 Vitamin B12 deficiency anemia due to intrinsic factor deficiency: Secondary | ICD-10-CM

## 2014-03-26 MED ORDER — CYANOCOBALAMIN 1000 MCG/ML IJ SOLN
1000.0000 ug | Freq: Once | INTRAMUSCULAR | Status: AC
  Administered 2014-03-26: 1000 ug via INTRAMUSCULAR

## 2014-05-06 ENCOUNTER — Ambulatory Visit (INDEPENDENT_AMBULATORY_CARE_PROVIDER_SITE_OTHER): Payer: Medicare Other

## 2014-05-06 DIAGNOSIS — D649 Anemia, unspecified: Secondary | ICD-10-CM

## 2014-05-06 MED ORDER — CYANOCOBALAMIN 1000 MCG/ML IJ SOLN
1000.0000 ug | Freq: Once | INTRAMUSCULAR | Status: AC
  Administered 2014-05-06: 1000 ug via INTRAMUSCULAR

## 2014-06-10 ENCOUNTER — Ambulatory Visit (INDEPENDENT_AMBULATORY_CARE_PROVIDER_SITE_OTHER): Payer: Medicare Other

## 2014-06-10 DIAGNOSIS — D649 Anemia, unspecified: Secondary | ICD-10-CM

## 2014-06-11 MED ORDER — CYANOCOBALAMIN 1000 MCG/ML IJ SOLN
1000.0000 ug | Freq: Once | INTRAMUSCULAR | Status: AC
  Administered 2014-06-11: 1000 ug via INTRAMUSCULAR

## 2014-07-10 ENCOUNTER — Ambulatory Visit (INDEPENDENT_AMBULATORY_CARE_PROVIDER_SITE_OTHER): Payer: Medicare Other

## 2014-07-10 DIAGNOSIS — D510 Vitamin B12 deficiency anemia due to intrinsic factor deficiency: Secondary | ICD-10-CM

## 2014-07-11 MED ORDER — CYANOCOBALAMIN 1000 MCG/ML IJ SOLN
1000.0000 ug | Freq: Once | INTRAMUSCULAR | Status: AC
  Administered 2014-07-11: 1000 ug via INTRAMUSCULAR

## 2014-08-12 ENCOUNTER — Ambulatory Visit (INDEPENDENT_AMBULATORY_CARE_PROVIDER_SITE_OTHER): Payer: Medicare Other

## 2014-08-12 DIAGNOSIS — D649 Anemia, unspecified: Secondary | ICD-10-CM

## 2014-08-22 MED ORDER — CYANOCOBALAMIN 1000 MCG/ML IJ SOLN
1000.0000 ug | Freq: Once | INTRAMUSCULAR | Status: AC
  Administered 2014-08-22: 1000 ug via INTRAMUSCULAR

## 2014-08-25 ENCOUNTER — Other Ambulatory Visit: Payer: Self-pay | Admitting: Pulmonary Disease

## 2014-09-16 ENCOUNTER — Ambulatory Visit (INDEPENDENT_AMBULATORY_CARE_PROVIDER_SITE_OTHER): Payer: Medicare Other

## 2014-09-16 DIAGNOSIS — Z23 Encounter for immunization: Secondary | ICD-10-CM

## 2014-09-16 DIAGNOSIS — E538 Deficiency of other specified B group vitamins: Secondary | ICD-10-CM

## 2014-09-17 MED ORDER — CYANOCOBALAMIN 1000 MCG/ML IJ SOLN
1000.0000 ug | Freq: Once | INTRAMUSCULAR | Status: AC
  Administered 2014-09-17: 1000 ug via INTRAMUSCULAR

## 2014-10-17 ENCOUNTER — Ambulatory Visit (INDEPENDENT_AMBULATORY_CARE_PROVIDER_SITE_OTHER): Payer: Medicare Other

## 2014-10-17 DIAGNOSIS — D510 Vitamin B12 deficiency anemia due to intrinsic factor deficiency: Secondary | ICD-10-CM

## 2014-10-17 MED ORDER — CYANOCOBALAMIN 1000 MCG/ML IJ SOLN
1000.0000 ug | Freq: Once | INTRAMUSCULAR | Status: AC
  Administered 2014-10-17: 1000 ug via INTRAMUSCULAR

## 2014-10-18 ENCOUNTER — Other Ambulatory Visit: Payer: Self-pay | Admitting: Pulmonary Disease

## 2014-11-18 ENCOUNTER — Ambulatory Visit (INDEPENDENT_AMBULATORY_CARE_PROVIDER_SITE_OTHER): Payer: Medicare Other

## 2014-11-18 DIAGNOSIS — D510 Vitamin B12 deficiency anemia due to intrinsic factor deficiency: Secondary | ICD-10-CM

## 2014-11-18 MED ORDER — CYANOCOBALAMIN 1000 MCG/ML IJ SOLN
1000.0000 ug | Freq: Once | INTRAMUSCULAR | Status: AC
  Administered 2014-11-18: 1000 ug via INTRAMUSCULAR

## 2014-12-30 ENCOUNTER — Ambulatory Visit (INDEPENDENT_AMBULATORY_CARE_PROVIDER_SITE_OTHER): Payer: Medicare Other

## 2014-12-30 DIAGNOSIS — D510 Vitamin B12 deficiency anemia due to intrinsic factor deficiency: Secondary | ICD-10-CM | POA: Diagnosis not present

## 2015-01-01 MED ORDER — CYANOCOBALAMIN 1000 MCG/ML IJ SOLN
1000.0000 ug | Freq: Once | INTRAMUSCULAR | Status: AC
  Administered 2014-12-30: 1000 ug via INTRAMUSCULAR

## 2015-01-08 ENCOUNTER — Other Ambulatory Visit (INDEPENDENT_AMBULATORY_CARE_PROVIDER_SITE_OTHER): Payer: Medicare Other

## 2015-01-08 ENCOUNTER — Ambulatory Visit (INDEPENDENT_AMBULATORY_CARE_PROVIDER_SITE_OTHER): Payer: Medicare Other | Admitting: Pulmonary Disease

## 2015-01-08 VITALS — BP 142/86 | HR 75 | Temp 97.0°F | Ht 76.0 in | Wt 214.8 lb

## 2015-01-08 DIAGNOSIS — R269 Unspecified abnormalities of gait and mobility: Secondary | ICD-10-CM

## 2015-01-08 DIAGNOSIS — C61 Malignant neoplasm of prostate: Secondary | ICD-10-CM | POA: Diagnosis not present

## 2015-01-08 DIAGNOSIS — I491 Atrial premature depolarization: Secondary | ICD-10-CM

## 2015-01-08 DIAGNOSIS — I10 Essential (primary) hypertension: Secondary | ICD-10-CM

## 2015-01-08 DIAGNOSIS — Z77090 Contact with and (suspected) exposure to asbestos: Secondary | ICD-10-CM

## 2015-01-08 DIAGNOSIS — K5730 Diverticulosis of large intestine without perforation or abscess without bleeding: Secondary | ICD-10-CM

## 2015-01-08 DIAGNOSIS — J449 Chronic obstructive pulmonary disease, unspecified: Secondary | ICD-10-CM

## 2015-01-08 DIAGNOSIS — M545 Low back pain, unspecified: Secondary | ICD-10-CM

## 2015-01-08 DIAGNOSIS — D510 Vitamin B12 deficiency anemia due to intrinsic factor deficiency: Secondary | ICD-10-CM

## 2015-01-08 DIAGNOSIS — E785 Hyperlipidemia, unspecified: Secondary | ICD-10-CM

## 2015-01-08 DIAGNOSIS — M159 Polyosteoarthritis, unspecified: Secondary | ICD-10-CM

## 2015-01-08 DIAGNOSIS — K635 Polyp of colon: Secondary | ICD-10-CM

## 2015-01-08 DIAGNOSIS — M150 Primary generalized (osteo)arthritis: Secondary | ICD-10-CM

## 2015-01-08 LAB — TSH: TSH: 2.97 u[IU]/mL (ref 0.35–4.50)

## 2015-01-08 LAB — BASIC METABOLIC PANEL
BUN: 25 mg/dL — AB (ref 6–23)
CALCIUM: 10.9 mg/dL — AB (ref 8.4–10.5)
CO2: 25 mEq/L (ref 19–32)
CREATININE: 1.41 mg/dL (ref 0.40–1.50)
Chloride: 108 mEq/L (ref 96–112)
GFR: 51.32 mL/min — AB (ref >60.00)
Glucose, Bld: 113 mg/dL — ABNORMAL HIGH (ref 70–99)
Potassium: 5.2 mEq/L — ABNORMAL HIGH (ref 3.5–5.1)
Sodium: 139 mEq/L (ref 135–145)

## 2015-01-08 LAB — CBC WITH DIFFERENTIAL/PLATELET
Basophils Absolute: 0 10*3/uL (ref 0.0–0.1)
Basophils Relative: 0.4 % (ref 0.0–3.0)
Eosinophils Absolute: 0.5 10*3/uL (ref 0.0–0.7)
Eosinophils Relative: 5 % (ref 0.0–5.0)
HEMATOCRIT: 46.4 % (ref 39.0–52.0)
HEMOGLOBIN: 16.1 g/dL (ref 13.0–17.0)
Lymphocytes Relative: 23.9 % (ref 12.0–46.0)
Lymphs Abs: 2.3 10*3/uL (ref 0.7–4.0)
MCHC: 34.7 g/dL (ref 30.0–36.0)
MCV: 83.6 fl (ref 78.0–100.0)
MONOS PCT: 6.3 % (ref 3.0–12.0)
Monocytes Absolute: 0.6 10*3/uL (ref 0.1–1.0)
NEUTROS ABS: 6.1 10*3/uL (ref 1.4–7.7)
Neutrophils Relative %: 64.4 % (ref 43.0–77.0)
PLATELETS: 172 10*3/uL (ref 150.0–400.0)
RBC: 5.56 Mil/uL (ref 4.22–5.81)
RDW: 13.2 % (ref 11.5–15.5)
WBC: 9.4 10*3/uL (ref 4.0–10.5)

## 2015-01-08 LAB — LIPID PANEL
CHOL/HDL RATIO: 5
Cholesterol: 174 mg/dL (ref 0–200)
HDL: 36.8 mg/dL — ABNORMAL LOW (ref >39.00)
LDL CALC: 114 mg/dL — AB (ref 0–99)
NonHDL: 137.2
TRIGLYCERIDES: 117 mg/dL (ref 0.0–149.0)
VLDL: 23.4 mg/dL (ref 0.0–40.0)

## 2015-01-08 LAB — HEPATIC FUNCTION PANEL
ALBUMIN: 4.3 g/dL (ref 3.5–5.2)
ALT: 12 U/L (ref 0–53)
AST: 15 U/L (ref 0–37)
Alkaline Phosphatase: 97 U/L (ref 39–117)
Bilirubin, Direct: 0.3 mg/dL (ref 0.0–0.3)
Total Bilirubin: 1.5 mg/dL — ABNORMAL HIGH (ref 0.2–1.2)
Total Protein: 7.2 g/dL (ref 6.0–8.3)

## 2015-01-08 LAB — VITAMIN B12: Vitamin B-12: 538 pg/mL (ref 211–911)

## 2015-01-08 LAB — PSA: PSA: 0.08 ng/mL — ABNORMAL LOW (ref 0.10–4.00)

## 2015-01-08 MED ORDER — DIAZEPAM 5 MG PO TABS: 5.0000 mg | ORAL_TABLET | Freq: Four times a day (QID) | ORAL | Status: DC | PRN

## 2015-01-08 NOTE — Patient Instructions (Signed)
Today we updated your med list in our EPIC system...    Continue your current medications the same...  Today we did your follow up FASTING blood work...    We will contact you w/ the results when available...   It was great talking w/ you today... Call for any problems or if we can be of service in any way...  Let's plan a follow up visit in 20mo, sooner if needed.Marland KitchenMarland Kitchen

## 2015-01-11 ENCOUNTER — Encounter: Payer: Self-pay | Admitting: Pulmonary Disease

## 2015-01-11 NOTE — Progress Notes (Signed)
Subjective:     Patient ID: Joseph Carter, male   DOB: 10-11-1932, 81 y.o.   MRN: [id number]  HPI 81 y/o WM here for a follow up visit... he has multiple medical problems as noted below...    ~  October 03, 2012:  71mo ROV & Joseph Carter presents w/ a 20mo hx of pain in the abd> initially seen at [facility]) & told he had gallstones (we don't have these records or the scan report); he recently saw [facility] for his regular Urology f/u & he rec that [facility] f/u w/ Joseph Carter because it sounded more like diverticulitis; pain has been occurring off&on since June, predom left sided (LUQ & flank), pains lasts several days & seems to resolve on it's own, then returns, denies n/v/d but has some constip & notes ?intermit relief w/ BM; pt had Colonoscopy by [facility] 1/13- 57mm polyp in desc colon (tubular adenoma), mod diverticulosis in sigmoid, radiation proctitis in rectum;  He also had appendectomy at age 37;  Denies dysuria, hematuria, or colicky renal pain...  We reviewed the need for Labs & CT Abd&Pelvis- see below...    MildCOPD/ HxAsbestos exposure/ s/p resection of intercostal cyst 1997> breathing is good, no cough/ phlegm/ hemoptysis/ CP/ dyspnea/ etc...    HBP> controlled on Amlodip5, Diovan160; BP= 140/78 & denies CP, palpit, SOB, edema, etc...    Chol> on diet alone & labs 1/13 ok x LDL=120    Hx prostate cancer/ LUTS> s/p brachytherapy per [facility] 2011 & followed by Joseph Carter on Flomax0.4 & Vesicare5; pt tells me his PSA has been falling & when last measured by Urology ~0.3    DJD, LBP, Cx Spondylosis> all followed by [name]; he is very stoic & not taking pain meds...    PA> on B12 shots monthly... We reviewed prob list, meds, xrays and labs> see below for updates >>   LABS 10/03/12:  Chems- ok x BUN=25 Creat=1.5 K=5.5;  CBC- wnl;  TSH=1.84;  Sed=16;  UA= trace heme, few rbc/wbc/bact...  CT Abd&Pelvis 10/05/12 showed several distal left ureteral calculi- 2cm above UVJ, largest 5.62mm size, no hydronephrosis  therefore NON-obstructing at present;  Mult other findings on the CT in this 81 y/o man> basilar atx/ scarring, coronary & abd ao/ iliac calcif, bilat renal cysts, mult layering gallstones, 1.7cm right adrenal adenoma, sigmoid diverticulosis w/o inflamm, intraprostatic therapy seeds, no adenopathy, sm fat containing LIH, degen changes in spine...   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     We reviewed prob list, meds, xrays and labs> see below for updates >> he had the 2014 Flu vaccine in Oct...  CXR 2/15 showed borderline cardiomeg, metallic clips and old right rib fractures, pleuroparenchymal scarring, NAD.Joseph KitchenMarland Carter  EKG 2/15 showed [place], [place], old IWMI, NAD...  LABS 2/15:  FLP- at goals x LDL=122 on diet rx;  Chems- ok x Cr=1.4;  CBC- wnl;  TSH=2.46;  PSA=0.13;  B12=765...    ~  January 08, 2015:  29yr Joseph Carter had Back Surg 4/15 by Joseph Carter & he is improved- more mobile, less pain, but  upset that extended family hasn't helped him very much;  He fell 1/16 (tripped into one of his tractors)- didn't go to ER or see his Ortho etc- "I toughed it out" and improved now... We reviewed the following medical problems during today's office visit >>     COPD/ HxAsbestos exposure/ s/p resection of intercostal cyst 1997> breathing is good, no cough/ phlegm/ hemoptysis/ CP/ dyspnea/ etc; he is not on meds and hasn't had any recent exac...    HBP> controlled on Amlodip5, Diovan160; BP= 142/86 & denies CP, palpit, dizzy, SOB, edema, etc...    Ven Insuffic> reminded to avoid sodium, elev legs, wear support hose...    Chol> on diet alone & labs 2/16 showed TChol  174, TG 117, HDL 37, LDL 114    GI- Divertics> followed by Joseph Carter- colonoscopy 1/13 showed 29mm desc colon polyp (tub adenoma), mod sigm divertics, radiation proctitis...    Hx prostate cancer/ BPH w/ LUTS/ Kidney Stones> s/p brachytherapy per Joseph Carter 2011 & followed by Joseph Carter on Flomax0.4 & off Vesicare; labs 2/16 showed PSA =0.08, BUN=25, Cr=1.41    DJD, LBP, Cx Spondylosis> on Tramadol & Percocet; all followed by Joseph Carter; s/p lumbar decompression & fusion L3-4 for sp stenosis by DrJones 4/15...    PA> on B12 shots monthly...    Anxiety> on Valium5 prn...  We reviewed prob list, meds, xrays and labs> see below for updates >>   LABS 2/16:  FLP- ok w/ LDL=114;  Chems- ok w/ BS=113, Cr=1.41;  CBC- wnl;  TSH=2.97;  B12=538;  PSA=0.08...           Problem List:  COPD (TFT-732) - ex-smoker (quit~1980) w/ hx COPD... no active symptoms... doing satis, no meds, no recent exac- denies SOB, CP, edema, etc- min AM cough/ no phlegm/ no blood etc...  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 ~  CXR 1/12 showed s/p surg on right, chr changes, NAD.Joseph Carter. ~  CXR 1/13 showed clear lungs, post surg changes on right w/o interval change, NAD.Joseph Carter. ~  CXR 2/15 & 4/15 showed borderline cardiomeg, metallic clips and old right rib fractures, pleuroparenchymal scarring, NAD...  HYPERTENSION (ICD-401.9) - on ASA 81mg /d, NORVASC 5mg /d and DIOVAN 160mg /d...  ~  + FamHx CAD- Bro w/ CABG... ~  2DEcho 9/98 showed mild LVH, norm LVF, mild AI... ~  1/12:  BP= 144/82 but all 130's/ 80's at home; denies HA, fatigue, visual changes, CP, palipit, dizziness, syncope, dyspnea, edema, etc... ~  1/13:  BP= 138/76 & he remains essentially asymptomatic... ~  2/15: on Amlod5 & Diovan160;  BP= 148/60 & he denies CP, palpit, SOB, edema, etc... ~  2/16: controlled on Amlodip5, Diovan160; BP= 142/86 & denies CP, palpit, dizzy, SOB, edema,  etc  Hx of PAC (ICD-427.61) - neg cardiac eval 1998... he takes ASA 81mg /d... ~  EKG 2/15 showed SBrady, rate=58, qs in III & aVF, low voltage...  HYPERCHOLESTEROLEMIA, BORDERLINE (ICD-272.4) ~  FLP 9/07 showed TChol 161, TG 54, HDL 33, LDL 117... on diet + exercise Rx... ~  [place] 5/09 showed TChol 155, TG 73, HDL 28, LDL 112 ~  FLP 6/10 showed TChol 160, TG 82, HDL 34, LDL 110 ~  FLP 1/12 showed TChol 159, TG 86, HDL 33, LDL 109 ~  FLP 1/13 on diet alone showed TChol 171, TG 68, HDL 37, LDL 120 ~  FLP 2/15 on diet alone showed TChol 177, TG  100, HDL 35, LDL 122 ~  FLP 2/16 on diet alone showed TChol 174, TG 117, HDL 37, LDL 114   DIVERTICULAR DISEASE (ICD-562.10) - colonoscopy 4/03 by Demetra Shiner showed divertics only... f/u rec 10 yrs. ~  1/13: f/u colonoscopy showed 79mm desc colon polyp (tub adenoma), mod sigm divertics, radiation proctitis...  BENIGN PROSTATIC HYPERTROPHY, HX OF (ICD-V13.8) ADENOCARCINOMA, PROSTATE (ICD-185) HYPOGONADISM (ICD-257.2) - prev hx of intol to Doxazosin, therefore changed to [facility] 0.4mg /d but states no benefit to his decr stream so he stopped it; only mild LTOS & denies incontinence, freq, nocturia... ~  labs 4/08 showed PSA= 2.91 ~  labs 5/09 showed PSA= 3.58 ~  labs 6/10 showed PSA= 4.31 (note: Testosterone level= 255)... refer to Urology, DrDavis ==> & eventual Dx Prostate Cancer & he decided on Brachytherapy per DrManning  (done 6/11)... ~  pt tells me he continues to follow up w/ DrDavis Q22mo but we don't have notes from him... ~  1/13: pt reports that DrDavis started Blunt and that the combination is helping his urination; last seen 9/12 & he reports that PSA was "ok"... ~  11/13:  Pt reports that his PSA is down to 0.3 per Urology; NOTE> labs here showed BUN=25 Creat=1.5 K=5.5  ~  2/15:  ?when he last saw Urology, [facility] (we do not have notes from them); he requests labs to be done here today> PSA=0.13, Cr=1.4... ~  2/16: s/p brachytherapy  per DrManning 2011 & followed by Joseph Carter on Flomax0.4 & off Vesicare; labs 2/16 showed PSA =0.08, BUN=25, Cr=1.41  Left URETERAL STONES/  Bilat RENAL CYSTS >> ~  11/13:  Pt presented w/ 45mo hx intermittent left sided abd / flank pain & eval from [facility] showing gallstones; DrDavis referred pt here for further eval of what sounded like diverticulitis but CT scan showed several distal ureteral stones ~2cm above the UVJ w/o hydronephrosis suggesting non-obstructive stones (largest= 5.38mm)- see above for mult other CT findings including bilat renal cysts, right adrenal adenoma, & mult sm layering gallstones;  We increased his Flomax0.4mg - take 2Qhs for medical expulsive therapy & referred him back to Urology... ~  2/15:  On Flomax0.4 & Vesicare5; Pt reports that he saw Urology after last OV, agreed w/ incr Flomax 0.4mg - 2Qhs; pt thinks he passed 1-2 stones, & not further problems reported...  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  BACK PAIN, LUMBAR (ICD-724.2) - s/p L4-5 laminectomy and microdiscectomy 5/01 by Tor Netters- pt is reluctant to return since he was told he'd need rods & doesn't want them; he tried shots yrs ago w/o benefit...  CERVICAL SPONDYLOSIS WITHOUT MYELOPATHY (ICD-721.0) - he also has known CSpine spondylosis and foramenal stenosis- followed by [name]...  ANEMIA, PERNICIOUS (ICD-281.0) - on B 12 shots monthly... ~  labs 5/09 showed B12= 602 ~  labs 6/10 showed B12= >1500 ~  labs 1/12 showed Hg= 15.9, B12= 497 ~  Labs 1/13 showed Hg= 16.7, B12 level >1500 & he tells me he takes B12 tabs Bid as well (he likes it that way). ~  Labs 11/13 showed Hg= 15.2 ~  Labs 2/15 on B12 shots monthly showed Hg= 16.4, MCV=88, B12 level= 765 ~  Labs 2/16 on B12 shots monthly showed Hg= 16.1, MCV=84, B12= 538   Past Surgical History  Procedure Laterality Date  . Appendectomy    .  Surgery for removal of  intercostal cyst  1997  . Lumbar laminectomy l4-5  2001    Dr. Luiz Ochoa  . Cts surgery on right      Dr. Amedeo Plenty. Carpal tunnel tunnel  . Prostate biopsy, then brachytherapy  04/2010    Dr. Rosana Hoes  . Prostate surgery    . Lung surgery      /w Dr. Arlyce Dice  . Lumbar laminectomy  03/07/2014    L3 L4   DR Ronnald Ramp  . Maximum access (mas)posterior lumbar interbody fusion (plif) 1 level N/A 03/07/2014    Procedure: FOR MAXIMUM ACCESS (MAS) POSTERIOR LUMBAR INTERBODY FUSION (PLIF) 1 LEVEL;  Surgeon: [name], MD;  Location: [facility];  Service: Neurosurgery;  Laterality: N/A;  FOR MAXIMUM ACCESS (MAS) POSTERIOR LUMBAR INTERBODY FUSION (PLIF) 1 LEVEL    Outpatient Encounter Prescriptions as of 01/08/2015  Medication Sig  . amLODipine (NORVASC) 5 MG tablet take 1 tablet by mouth once daily  . aspirin 81 MG tablet Take 81 mg by mouth daily.   . cyanocobalamin (,VITAMIN B-12,) 1000 MCG/ML injection Inject 1,000 mcg into the muscle every 30 (thirty) days.  . diazepam (VALIUM) 5 MG tablet Take 1 tablet (5 mg total) by mouth every 6 (six) hours as needed for muscle spasms.  . fluocinonide-emollient (LIDEX-E) 0.05 % cream Apply 1 application topically 2 (two) times daily. (Patient taking differently: Apply 1 application topically 2 (two) times daily as needed. )  . Multiple Vitamins-Minerals (CENTRUM SILVER PO) Take 1 tablet by mouth daily.    Joseph Carter oxyCODONE-acetaminophen (PERCOCET/ROXICET) 5-325 MG per tablet Take 1-2 tablets by mouth every 4 (four) hours as needed for moderate pain or severe pain.  . tamsulosin (FLOMAX) 0.4 MG CAPS capsule take 1 capsule by mouth at bedtime  . valsartan (DIOVAN) 160 MG tablet take 1 tablet once daily  . [DISCONTINUED] diazepam (VALIUM) 5 MG tablet Take 1 tablet (5 mg total) by mouth every 6 (six) hours as needed for muscle spasms.  . [DISCONTINUED] amLODipine (NORVASC) 5 MG tablet Take 5 mg by mouth daily. take 1 tablet by mouth once daily  .  [DISCONTINUED] benzonatate (TESSALON) 200 MG capsule Take 1 capsule (200 mg total) by mouth 3 (three) times daily as needed for cough.  . [DISCONTINUED] traMADol (ULTRAM) 50 MG tablet Take 1 tablet (50 mg total) by mouth every 6 (six) hours as needed for moderate pain.    Allergies  Allergen Reactions  . Lisinopril     REACTION: Hx of ACE cough...    Current Medications, Allergies, Past Medical History, Past Surgical History, Family History, and Social History were reviewed in [employer] record.   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    Review of Systems         The patient complains of abd discomfort, malaise, dyspnea on exertion, urinary frequency, nocturia, joint pain, stiffness, and arthritis.  The patient denies fever, chills, sweats, anorexia, fatigue, weakness, weight loss, sleep disorder, blurring, diplopia, eye irritation, eye discharge, vision loss, eye pain, photophobia, earache, ear discharge, tinnitus, decreased hearing, nasal congestion, nosebleeds, sore throat, hoarseness, chest pain, palpitations, syncope, orthopnea, PND, peripheral edema, cough, dyspnea at rest, excessive sputum, hemoptysis, wheezing, pleurisy, nausea, vomiting, diarrhea, change in bowel habits, melena, hematochezia, jaundice, gas/bloating, indigestion/ heartburn, dysphagia, odynophagia, dysuria, hematuria, urinary hesitancy, incontinence, joint swelling, muscle cramps, muscle weakness, restless legs, leg pain at night, leg pain with exertion, rash, itching, dryness, suspicious lesions, paralysis, paresthesias, seizures,  tremors, vertigo, transient blindness, frequent falls, frequent headaches, difficulty walking, depression, anxiety, memory loss, confusion, cold intolerance, heat intolerance, polydipsia, polyphagia, polyuria, unusual weight change, abnormal bruising, bleeding, enlarged lymph nodes, urticaria, allergic rash, hay  fever, and recurrent infections.   Objective:   Physical Exam    WD, WN, 81 y/o WM in NAD... GENERAL:  Alert & oriented; pleasant & cooperative... HEENT:  [place]/AT, , EACs-clear, TMs-wnl, NOSE-clear, THROAT-clear & wnl. NECK:  Supple w/ fairROM; no JVD; normal carotid impulses w/o bruits; no thyromegaly or nodules palpated; no lymphadenopathy. CHEST:  Clear to P & A; without wheezes/ rales/ or rhonchi heard... HEART:  Regular Rhythm; without murmurs/ rubs/ or gallops detected... ABDOMEN:  Soft & nontender; normal bowel sounds; no organomegaly or masses palpated... EXT: without deformities, mild arthritic changes; no varicose veins/ venous insuffic/ or edema. NEURO:  CN's intact;  no focal neuro deficits... DERM:  No lesions noted; no rash etc...  RADIOLOGY DATA:  Reviewed in the EPIC EMR & discussed w/ the patient...  LABORATORY DATA:  Reviewed in the EPIC EMR & discussed w/ the patient...   Assessment:      COPD, Hx Asbestos Exposure>  He quit smoking ~1980, CXR has been stable w/ post op changes, NAD...  HBP>  Controlled on his 2 meds, continue same...  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  GU> BPH, Prostate Cancer, Hypogonadism>  Followed by [facility]OXWRUEA at [facility] on [address], pt not on Testos replacement rx; he requested PSA here 2/16 = 0.08...  DJD, LBP, Cervical Spondylosis>  He manages quite well w/ OTC analgesics as needed...  Hx PA/ B12 Defic>  His B12 level is =538 now on monthly B12 shots, continue same...     Plan:     Patient's Medications  New Prescriptions   No medications on file  Previous Medications   AMLODIPINE (NORVASC) 5 MG TABLET    take 1 tablet by mouth once daily   ASPIRIN 81 MG TABLET    Take 81 mg by mouth daily.    CYANOCOBALAMIN (,VITAMIN  B-12,) 1000 MCG/ML INJECTION    Inject 1,000 mcg into the muscle every 30 (thirty) days.   FLUOCINONIDE-EMOLLIENT (LIDEX-E) 0.05 % CREAM    Apply 1 application topically 2 (two) times daily.   MULTIPLE VITAMINS-MINERALS (CENTRUM SILVER PO)    Take 1 tablet by mouth daily.     OXYCODONE-ACETAMINOPHEN (PERCOCET/ROXICET) 5-325 MG PER TABLET    Take 1-2 tablets by mouth every 4 (four) hours as needed for moderate pain or severe pain.   TAMSULOSIN (FLOMAX) 0.4 MG CAPS CAPSULE    take 1 capsule by mouth at bedtime   VALSARTAN (DIOVAN) 160 MG TABLET    take 1 tablet once daily  Modified Medications   Modified Medication Previous Medication   DIAZEPAM (VALIUM) 5 MG TABLET diazepam (VALIUM) 5 MG tablet      Take 1 tablet (5 mg total) by mouth every 6 (six) hours as needed for muscle spasms.    Take 1 tablet (5 mg total) by mouth every 6 (six) hours as needed for muscle spasms.  Discontinued Medications   AMLODIPINE (NORVASC) 5 MG TABLET    Take 5 mg by mouth daily. take 1 tablet by mouth once daily   BENZONATATE (TESSALON) 200 MG CAPSULE    Take  1 capsule (200 mg total) by mouth 3 (three) times daily as needed for cough.   TRAMADOL (ULTRAM) 50 MG TABLET    Take 1 tablet (50 mg total) by mouth every 6 (six) hours as needed for moderate pain.

## 2015-02-10 ENCOUNTER — Ambulatory Visit (INDEPENDENT_AMBULATORY_CARE_PROVIDER_SITE_OTHER): Payer: Medicare Other

## 2015-02-10 DIAGNOSIS — D510 Vitamin B12 deficiency anemia due to intrinsic factor deficiency: Secondary | ICD-10-CM

## 2015-02-10 MED ORDER — CYANOCOBALAMIN 1000 MCG/ML IJ SOLN
1000.0000 ug | Freq: Once | INTRAMUSCULAR | Status: AC
  Administered 2015-02-10: 1000 ug via INTRAMUSCULAR

## 2015-02-22 IMAGING — CR DG CHEST 2V
3 series · 3 of 3 positions shown · non-contrast
Comparison: DG CHEST 2 VIEW dated 12/06/2011

CLINICAL DATA: Smoker.  Hypertension.

EXAM:
CHEST  2 VIEW

[view not recorded (1 of 3)]
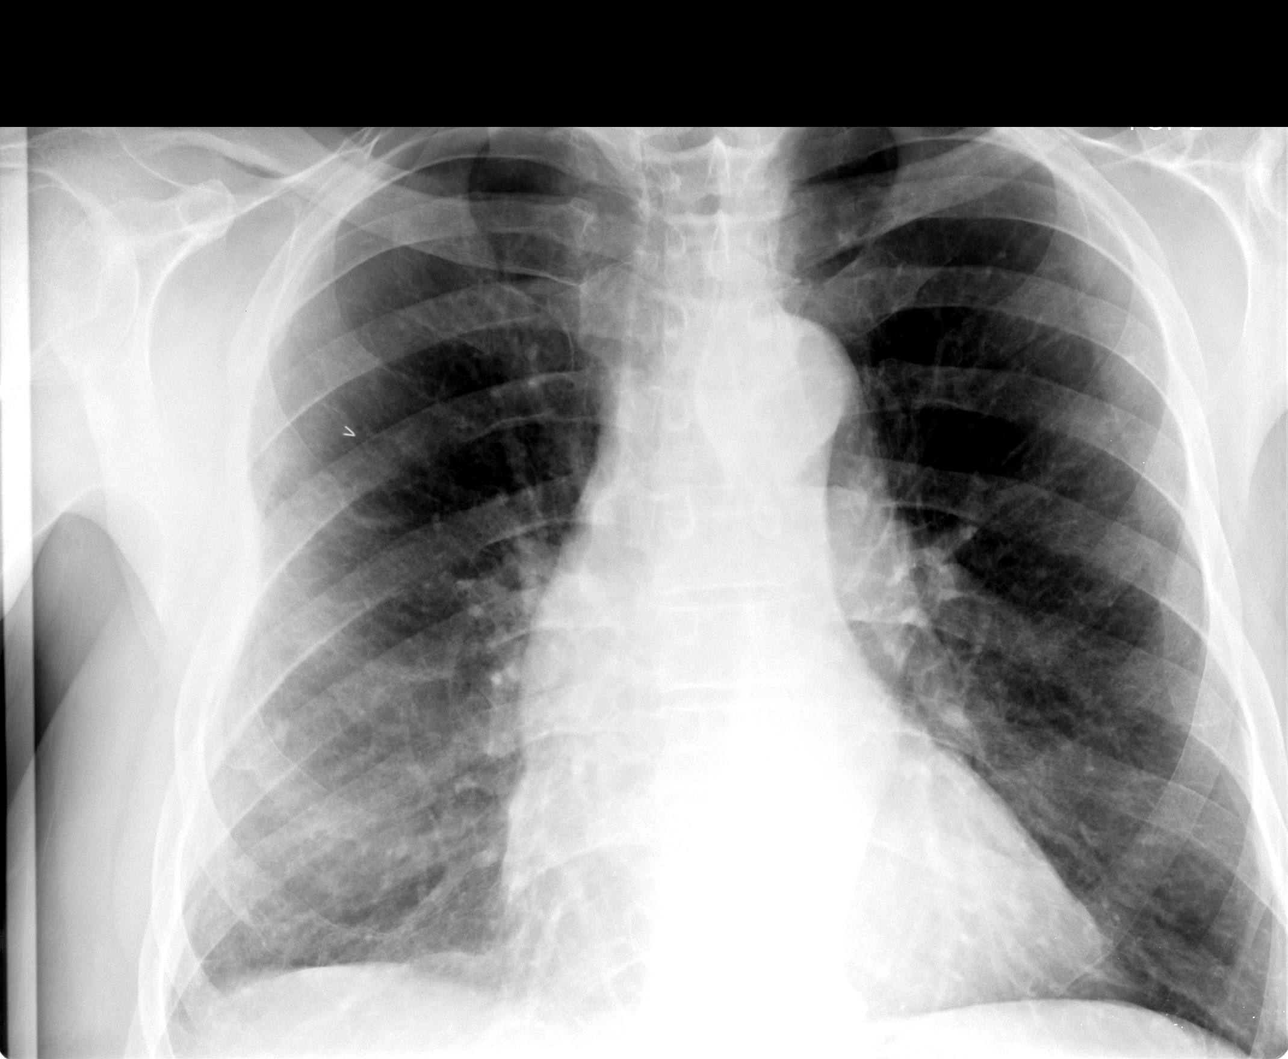

[view not recorded (2 of 3)]
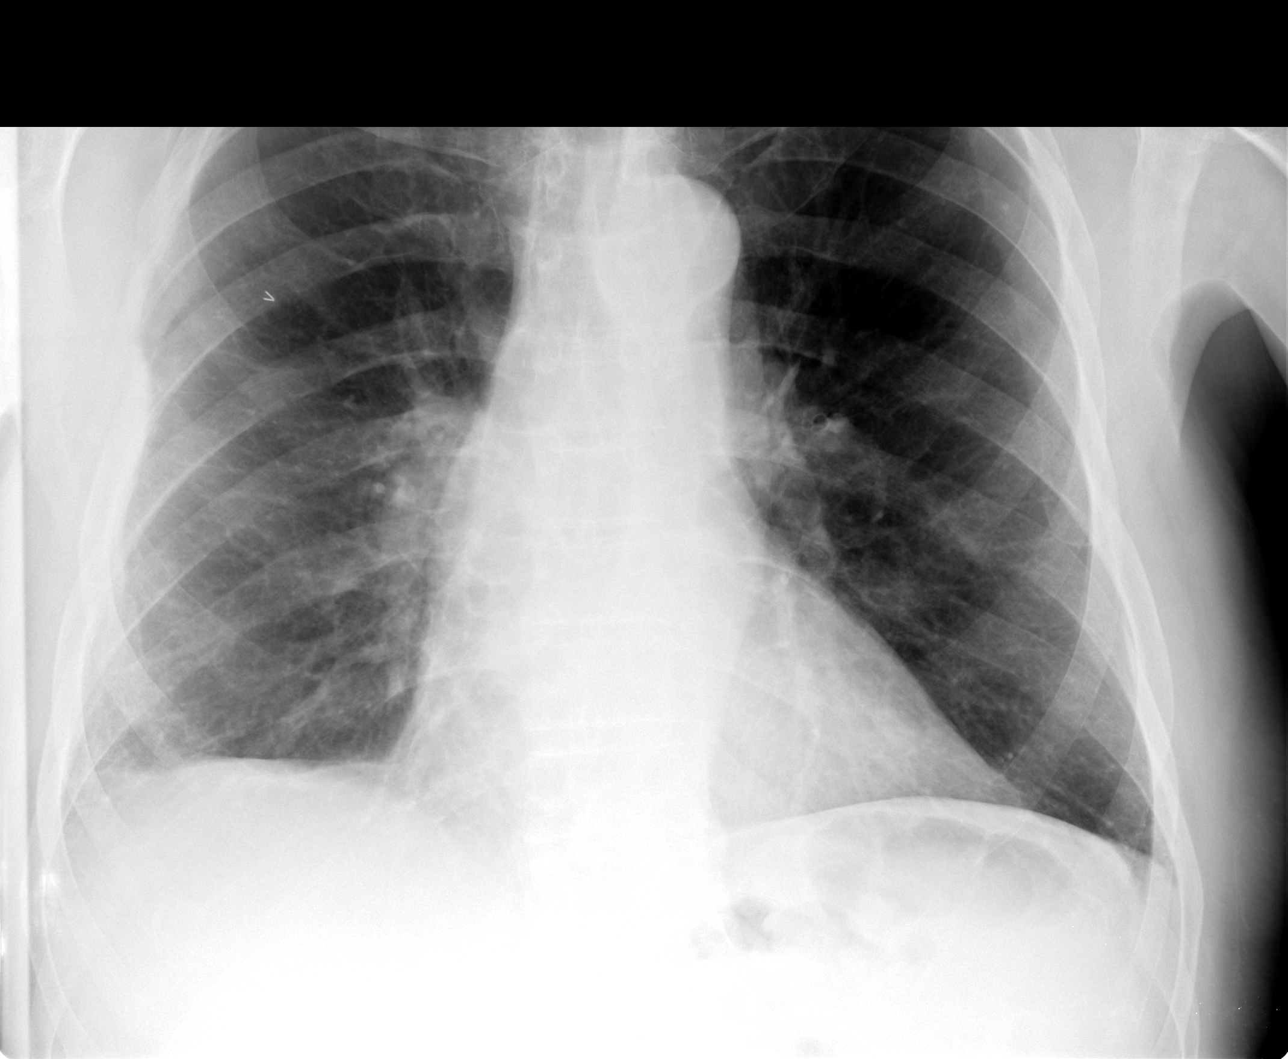

[view not recorded (3 of 3)]
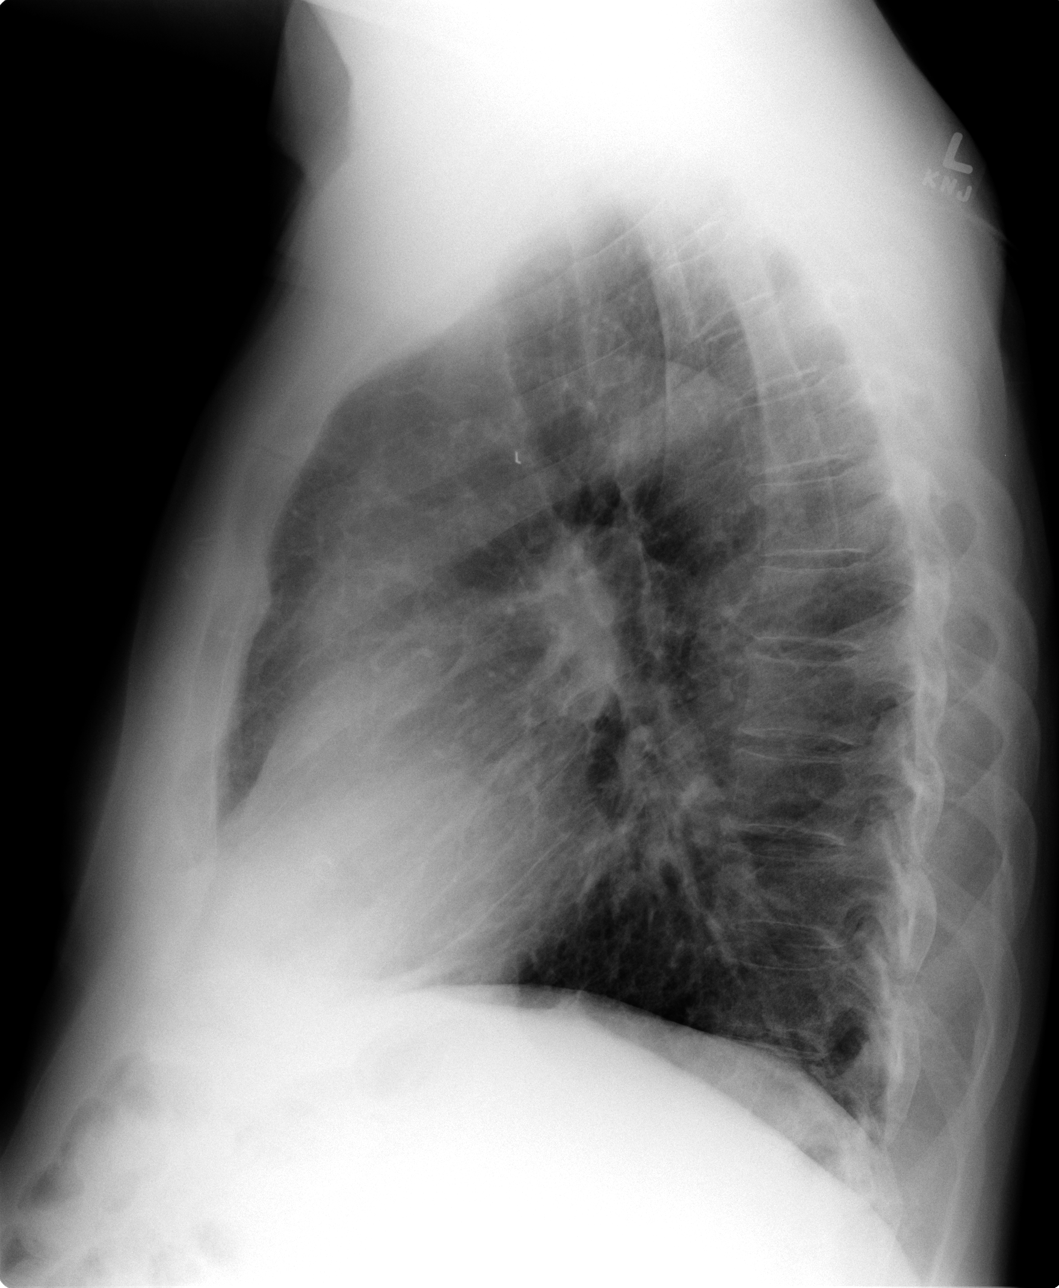

[3 of 3 positions shown; findings below may reference images not displayed]

FINDINGS: Mediastinum and hilar structures normal. Borderline cardiomegaly, no
pulmonary venous congestion. Metallic clips noted of the right
chest. No pleural effusion or pneumothorax. Pleural parenchymal
thickening right cyst with scarring. Deformity of the right chest
suggesting old rib fractures again noted.
IMPRESSION: No acute cardiopulmonary disease. Pleural parenchymal scarring right
chest. Slightly deformity of the right chest suggesting old rib
fractures. Chest is stable from 12/06/2011.

## 2015-03-12 ENCOUNTER — Ambulatory Visit (INDEPENDENT_AMBULATORY_CARE_PROVIDER_SITE_OTHER): Payer: Medicare Other

## 2015-03-12 DIAGNOSIS — D510 Vitamin B12 deficiency anemia due to intrinsic factor deficiency: Secondary | ICD-10-CM

## 2015-03-12 MED ORDER — CYANOCOBALAMIN 1000 MCG/ML IJ SOLN
1000.0000 ug | Freq: Once | INTRAMUSCULAR | Status: AC
Start: 2015-03-12 — End: 2015-03-12
  Administered 2015-03-12: 1000 ug via INTRAMUSCULAR

## 2015-04-14 ENCOUNTER — Ambulatory Visit (INDEPENDENT_AMBULATORY_CARE_PROVIDER_SITE_OTHER): Payer: Medicare Other

## 2015-04-14 DIAGNOSIS — D510 Vitamin B12 deficiency anemia due to intrinsic factor deficiency: Secondary | ICD-10-CM | POA: Diagnosis not present

## 2015-04-14 MED ORDER — CYANOCOBALAMIN 1000 MCG/ML IJ SOLN
1000.0000 ug | Freq: Once | INTRAMUSCULAR | Status: AC
  Administered 2015-04-14: 1000 ug via INTRAMUSCULAR

## 2015-04-20 ENCOUNTER — Other Ambulatory Visit: Payer: Self-pay | Admitting: Pulmonary Disease

## 2015-05-14 ENCOUNTER — Ambulatory Visit (INDEPENDENT_AMBULATORY_CARE_PROVIDER_SITE_OTHER): Payer: Medicare Other

## 2015-05-14 DIAGNOSIS — E538 Deficiency of other specified B group vitamins: Secondary | ICD-10-CM

## 2015-05-15 ENCOUNTER — Other Ambulatory Visit: Payer: Self-pay | Admitting: Pulmonary Disease

## 2015-05-15 MED ORDER — CYANOCOBALAMIN 1000 MCG/ML IJ SOLN
1000.0000 ug | Freq: Once | INTRAMUSCULAR | Status: AC
  Administered 2015-05-14: 1000 ug via INTRAMUSCULAR

## 2015-06-14 ENCOUNTER — Other Ambulatory Visit: Payer: Self-pay | Admitting: Pulmonary Disease

## 2015-06-16 ENCOUNTER — Ambulatory Visit (INDEPENDENT_AMBULATORY_CARE_PROVIDER_SITE_OTHER): Payer: Medicare Other

## 2015-06-16 DIAGNOSIS — D510 Vitamin B12 deficiency anemia due to intrinsic factor deficiency: Secondary | ICD-10-CM

## 2015-06-17 MED ORDER — CYANOCOBALAMIN 1000 MCG/ML IJ SOLN
1000.0000 ug | Freq: Once | INTRAMUSCULAR | Status: AC
  Administered 2015-06-16: 1000 ug via INTRAMUSCULAR

## 2015-07-16 ENCOUNTER — Ambulatory Visit: Payer: Medicare Other

## 2015-07-17 ENCOUNTER — Ambulatory Visit (INDEPENDENT_AMBULATORY_CARE_PROVIDER_SITE_OTHER): Payer: Medicare Other

## 2015-07-17 DIAGNOSIS — E538 Deficiency of other specified B group vitamins: Secondary | ICD-10-CM

## 2015-07-17 MED ORDER — CYANOCOBALAMIN 1000 MCG/ML IJ SOLN
1000.0000 ug | Freq: Once | INTRAMUSCULAR | Status: AC
  Administered 2015-07-17: 1000 ug via INTRAMUSCULAR

## 2015-08-13 ENCOUNTER — Other Ambulatory Visit: Payer: Self-pay | Admitting: Pulmonary Disease

## 2015-08-17 ENCOUNTER — Ambulatory Visit (INDEPENDENT_AMBULATORY_CARE_PROVIDER_SITE_OTHER): Payer: Medicare Other

## 2015-08-17 DIAGNOSIS — Z23 Encounter for immunization: Secondary | ICD-10-CM

## 2015-08-17 DIAGNOSIS — D510 Vitamin B12 deficiency anemia due to intrinsic factor deficiency: Secondary | ICD-10-CM | POA: Diagnosis not present

## 2015-08-17 MED ORDER — CYANOCOBALAMIN 1000 MCG/ML IJ SOLN
1000.0000 ug | Freq: Once | INTRAMUSCULAR | Status: AC
  Administered 2015-08-17: 1000 ug via INTRAMUSCULAR

## 2015-09-12 ENCOUNTER — Other Ambulatory Visit: Payer: Self-pay | Admitting: Pulmonary Disease

## 2015-09-16 ENCOUNTER — Ambulatory Visit: Payer: Medicare Other

## 2015-09-17 ENCOUNTER — Ambulatory Visit (INDEPENDENT_AMBULATORY_CARE_PROVIDER_SITE_OTHER): Payer: Medicare Other

## 2015-09-17 DIAGNOSIS — D510 Vitamin B12 deficiency anemia due to intrinsic factor deficiency: Secondary | ICD-10-CM | POA: Diagnosis not present

## 2015-09-17 MED ORDER — CYANOCOBALAMIN 1000 MCG/ML IJ SOLN
1000.0000 ug | Freq: Once | INTRAMUSCULAR | Status: AC
  Administered 2015-09-17: 1000 ug via INTRAMUSCULAR

## 2015-10-12 ENCOUNTER — Other Ambulatory Visit: Payer: Self-pay | Admitting: Pulmonary Disease

## 2015-10-19 ENCOUNTER — Ambulatory Visit (INDEPENDENT_AMBULATORY_CARE_PROVIDER_SITE_OTHER): Payer: Medicare Other

## 2015-10-19 DIAGNOSIS — D510 Vitamin B12 deficiency anemia due to intrinsic factor deficiency: Secondary | ICD-10-CM | POA: Diagnosis not present

## 2015-10-20 MED ORDER — CYANOCOBALAMIN 1000 MCG/ML IJ SOLN
1000.0000 ug | Freq: Once | INTRAMUSCULAR | Status: AC
  Administered 2015-10-20: 1000 ug via INTRAMUSCULAR

## 2015-10-21 ENCOUNTER — Encounter: Payer: Self-pay | Admitting: Pulmonary Disease

## 2015-10-21 ENCOUNTER — Ambulatory Visit (INDEPENDENT_AMBULATORY_CARE_PROVIDER_SITE_OTHER): Payer: Medicare Other | Admitting: Pulmonary Disease

## 2015-10-21 VITALS — BP 142/80 | HR 60 | Temp 97.6°F | Wt 218.6 lb

## 2015-10-21 DIAGNOSIS — M545 Low back pain, unspecified: Secondary | ICD-10-CM

## 2015-10-21 DIAGNOSIS — J449 Chronic obstructive pulmonary disease, unspecified: Secondary | ICD-10-CM | POA: Diagnosis not present

## 2015-10-21 DIAGNOSIS — E785 Hyperlipidemia, unspecified: Secondary | ICD-10-CM | POA: Diagnosis not present

## 2015-10-21 DIAGNOSIS — C61 Malignant neoplasm of prostate: Secondary | ICD-10-CM

## 2015-10-21 DIAGNOSIS — Z77090 Contact with and (suspected) exposure to asbestos: Secondary | ICD-10-CM | POA: Diagnosis not present

## 2015-10-21 DIAGNOSIS — I10 Essential (primary) hypertension: Secondary | ICD-10-CM

## 2015-10-21 DIAGNOSIS — M150 Primary generalized (osteo)arthritis: Secondary | ICD-10-CM

## 2015-10-21 DIAGNOSIS — D510 Vitamin B12 deficiency anemia due to intrinsic factor deficiency: Secondary | ICD-10-CM

## 2015-10-21 DIAGNOSIS — M159 Polyosteoarthritis, unspecified: Secondary | ICD-10-CM

## 2015-10-21 DIAGNOSIS — R269 Unspecified abnormalities of gait and mobility: Secondary | ICD-10-CM

## 2015-10-21 MED ORDER — AMLODIPINE BESYLATE 5 MG PO TABS: 5.0000 mg | ORAL_TABLET | Freq: Every day | ORAL | Status: DC

## 2015-10-21 MED ORDER — VALSARTAN 160 MG PO TABS: 160.0000 mg | ORAL_TABLET | Freq: Every day | ORAL | Status: DC

## 2015-10-21 MED ORDER — TAMSULOSIN HCL 0.4 MG PO CAPS: 0.4000 mg | ORAL_CAPSULE | Freq: Every day | ORAL | Status: DC

## 2015-10-21 NOTE — Patient Instructions (Signed)
Today we updated your med list in our EPIC system...    Continue your current medications the same...    We refilled your meds per request...  Call for any questions...  Let's plan a follow up visit in 4-75mo, with follow up blood work at that time.Marland KitchenMarland Kitchen

## 2015-10-21 NOTE — Progress Notes (Signed)
Subjective:     Patient ID: Joseph Carter, male   DOB: May 26, 1932, 81 y.o.   MRN: [id number]  HPI 81 y/o WM here for a follow up visit... he has multiple medical problems as noted below...    ~  October 03, 2012:  36mo ROV & [name] presents w/ a 68mo hx of pain in the abd> initially seen at [facility]) & told he had gallstones (we don't have these records or the scan report); he recently saw [name] for his regular Urology f/u & he rec that [facility] f/u w/ Joseph Carter because it sounded more like diverticulitis; pain has been occurring off&on since June, predom left sided (LUQ & flank), pains lasts several days & seems to resolve on it's own, then returns, denies n/v/d but has some constip & notes ?intermit relief w/ BM; pt had Colonoscopy by [facility] 1/13- 35mm polyp in desc colon (tubular adenoma), mod diverticulosis in sigmoid, radiation proctitis in rectum;  He also had appendectomy at age 52;  Denies dysuria, hematuria, or colicky renal pain...  We reviewed the need for Labs & CT Abd&Pelvis- see below...    MildCOPD/ HxAsbestos exposure/ s/p resection of intercostal cyst 1997> breathing is good, no cough/ phlegm/ hemoptysis/ CP/ dyspnea/ etc...    HBP> controlled on Amlodip5, Diovan160; BP= 140/78 & denies CP, palpit, SOB, edema, etc...    Chol> on diet alone & labs 1/13 ok x LDL=120    Hx prostate cancer/ LUTS> s/p brachytherapy per [name] 2011 & followed by [name] on Flomax0.4 & Vesicare5; pt tells me his PSA has been falling & when last measured by Urology ~0.3    DJD, LBP, Cx Spondylosis> all followed by [name]; he is very stoic & not taking pain meds...    PA> on B12 shots monthly... We reviewed prob list, meds, xrays and labs> see below for updates >>   LABS 10/03/12:  Chems- ok x BUN=25 Creat=1.5 K=5.5;  CBC- wnl;  TSH=1.84;  Sed=16;  UA= trace heme, few rbc/wbc/bact...  CT Abd&Pelvis 10/05/12 showed several distal left ureteral calculi- 2cm above UVJ, largest 5.41mm size, no hydronephrosis  therefore NON-obstructing at present;  Mult other findings on the CT in this 81 y/o man> basilar atx/ scarring, coronary & abd ao/ iliac calcif, bilat renal cysts, mult layering gallstones, 1.7cm right adrenal adenoma, sigmoid diverticulosis w/o inflamm, intraprostatic therapy seeds, no adenopathy, sm fat containing LIH, degen changes in spine...   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     We reviewed prob list, meds, xrays and labs> see below for updates >> he had the 2014 Flu vaccine in Oct...  CXR 2/15 showed borderline cardiomeg, metallic clips and old right rib fractures, pleuroparenchymal scarring, NAD.Joseph KitchenMarland Carter  EKG 2/15 showed [place], [place], old IWMI, NAD...  LABS 2/15:  FLP- at goals x LDL=122 on diet rx;  Chems- ok x Cr=1.4;  CBC- wnl;  TSH=2.46;  PSA=0.13;  B12=765...    ~  January 08, 2015:  43yr Joseph Carter had Back Surg 4/15 by [name] & he is improved- more mobile, less pain, but  upset that extended family hasn't helped him very much;  He fell 1/16 (tripped into one of his tractors)- didn't go to ER or see his Ortho etc- "I toughed it out" and improved now... We reviewed the following medical problems during today's office visit >>     COPD/ HxAsbestos exposure/ s/p resection of intercostal cyst 1997> breathing is good, no cough/ phlegm/ hemoptysis/ CP/ dyspnea/ etc; he is not on meds and hasn't had any recent exac...    HBP> controlled on Amlodip5, Diovan160; BP= 142/86 & denies CP, palpit, dizzy, SOB, edema, etc...    Ven Insuffic> reminded to avoid sodium, elev legs, wear support hose...    Chol> on diet alone & labs 2/16 showed TChol  174, TG 117, HDL 37, LDL 114    GI- Divertics> followed by [name]- colonoscopy 1/13 showed 54mm desc colon polyp (tub adenoma), mod sigm divertics, radiation proctitis...    Hx prostate cancer/ BPH w/ LUTS/ Kidney Stones> s/p brachytherapy per [facility] 2011 & followed by [name] on Flomax0.4 & off Vesicare; labs 2/16 showed PSA =0.08, BUN=25, Cr=1.41    DJD, LBP, Cx Spondylosis> on Tramadol & Percocet; all followed by [name]; s/p lumbar decompression & fusion L3-4 for sp stenosis by DrJones 4/15...    PA> on B12 shots monthly...    Anxiety> on Valium5 prn...  We reviewed prob list, meds, xrays and labs> see below for updates >>   LABS 2/16:  FLP- ok w/ LDL=114;  Chems- ok w/ BS=113, Cr=1.41;  CBC- wnl;  TSH=2.97;  B12=538;  PSA=0.08...  ~  October 21, 2015:  56mo ROV & medical f/u visit> Joseph Carter returns for a routine f/u visit feeling well w/o Joseph complaints or concerns; he states that his breathing is good, min cough/sput, no hemoptysis, stable DOE- sl better he thinks, no CP etc;  He sleeps well, good energy, mows yard/ tractor work, 20 acres; he has no children but raised 3 neices/nephews... We reviewed the following medical problems during today's office visit >>     COPD/ HxAsbestos exposure/ s/p resection of intercostal cyst 1997> breathing is good, no cough/ phlegm/ hemoptysis/ CP/ dyspnea/ etc; he is not on meds and hasn't had any recent exac...    HBP> controlled on ASA81, Amlodip5, Diovan160; BP= 142/80 & denies CP, palpit, dizzy, SOB, edema, etc...    Ven Insuffic> reminded to avoid sodium, elev legs, wear support hose...    Chol> on diet alone & labs 2/16 showed TChol 174, TG 117, HDL 37, LDL 114    GI- Divertics> followed by [name]- colonoscopy 1/13 showed 29mm desc colon polyp (tub adenoma), mod sigm divertics, radiation proctitis...    Hx prostate cancer/ BPH w/ LUTS/ Kidney Stones> s/p brachytherapy [facility] 2011 & followed by [name] on Flomax0.4 & off Vesicare; labs 2/16  showed PSA =0.08, BUN=25, Cr=1.41    DJD, LBP, Cx Spondylosis> on Tramadol & Percocet; all followed by [name]; s/p lumbar decompression & fusion L3-4 for sp stenosis by DrJones 4/15...    PA> on B12 shots monthly...    Anxiety> prev on Valium5 prn...  EXAM shows Afeb, VSS, O2sat=97% on RA;  HEENT- neg, mallampati2;  Chest- clear w/o w/r/r;  Heart- RR w/o m/r/g;  Abd- soft, nontender, neg;  Ext- neg w/o c/c/e;  Neuro- intact... IMP/PLAN>>  Mateo remains stable at 83 on the above meds; advised to continue same + diet/exercise & call for any problems...            Problem List:  COPD (  ICD-496) - ex-smoker (quit~1980) w/ hx COPD... no active symptoms... doing satis, no meds, no recent exac- denies SOB, CP, edema, etc- min AM cough/ no phlegm/ no blood etc...  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 ~  CXR 1/12 showed s/p surg on right, chr changes, NAD.Joseph Carter. ~  CXR 1/13 showed clear lungs, post surg changes on right w/o interval change, NAD.Joseph Carter. ~  CXR 2/15 & 4/15 showed borderline cardiomeg, metallic clips and old right rib fractures, pleuroparenchymal scarring, NAD...  HYPERTENSION (ICD-401.9) - on ASA 81mg /d, NORVASC 5mg /d and DIOVAN 160mg /d...  ~  + FamHx CAD- Bro w/ CABG... ~  2DEcho 9/98 showed mild LVH, norm LVF, mild AI... ~  1/12:  BP= 144/82 but all 130's/ 80's at home; denies HA, fatigue, visual changes, CP, palipit, dizziness, syncope, dyspnea, edema, etc... ~  1/13:  BP= 138/76 & he remains essentially asymptomatic... ~  2/15: on Amlod5 & Diovan160;  BP= 148/60 & he denies CP, palpit, SOB, edema, etc... ~  2/16: controlled on Amlodip5, Diovan160; BP= 142/86 & denies CP, palpit, dizzy, SOB, edema, etc  Hx of PAC (ICD-427.61) - neg cardiac eval 1998... he takes ASA 81mg /d... ~  EKG 2/15 showed SBrady, rate=58, qs in III & aVF, low voltage...  HYPERCHOLESTEROLEMIA, BORDERLINE  (ICD-272.4) ~  FLP 9/07 showed TChol 161, TG 54, HDL 33, LDL 117... on diet + exercise Rx... ~  [place] 5/09 showed TChol 155, TG 73, HDL 28, LDL 112 ~  FLP 6/10 showed TChol 160, TG 82, HDL 34, LDL 110 ~  FLP 1/12 showed TChol 159, TG 86, HDL 33, LDL 109 ~  FLP 1/13 on diet alone showed TChol 171, TG 68, HDL 37, LDL 120 ~  FLP 2/15 on diet alone showed TChol 177, TG 100, HDL 35, LDL 122 ~  FLP 2/16 on diet alone showed TChol 174, TG 117, HDL 37, LDL 114   DIVERTICULAR DISEASE (ICD-562.10) - colonoscopy 4/03 by Demetra Shiner showed divertics only... f/u rec 10 yrs. ~  1/13: f/u colonoscopy showed 78mm desc colon polyp (tub adenoma), mod sigm divertics, radiation proctitis...  BENIGN PROSTATIC HYPERTROPHY, HX OF (ICD-V13.8) ADENOCARCINOMA, PROSTATE (ICD-185) HYPOGONADISM (ICD-257.2) - prev hx of intol to Doxazosin, therefore changed to [facility] 0.4mg /d but states no benefit to his decr stream so he stopped it; only mild LTOS & denies incontinence, freq, nocturia... ~  labs 4/08 showed PSA= 2.91 ~  labs 5/09 showed PSA= 3.58 ~  labs 6/10 showed PSA= 4.31 (note: Testosterone level= 255)... refer to Urology, DrDavis ==> & eventual Dx Prostate Cancer & he decided on Brachytherapy per DrManning  (done 6/11)... ~  pt tells me he continues to follow up w/ DrDavis Q19mo but we don't have notes from him... ~  1/13: pt reports that DrDavis started [place] and that the combination is helping his urination; last seen 9/12 & he reports that PSA was "ok"... ~  11/13:  Pt reports that his PSA is down to 0.3 per Urology; NOTE> labs here showed BUN=25 Creat=1.5 K=5.5  ~  2/15:  ?when he last saw Urology, [facility] (we do not have notes from them); he requests labs to be done here today> PSA=0.13, Cr=1.4... ~  2/16: s/p brachytherapy per DrManning 2011 & followed by DrDavis on Flomax0.4 & off Vesicare; labs 2/16 showed PSA =0.08, BUN=25, Cr=1.41  Left URETERAL STONES/  Bilat RENAL CYSTS >> ~  11/13:  Pt presented  w/ 17mo hx intermittent left sided abd / flank pain & eval from [facility] showing gallstones; DrDavis referred pt here for further eval of what sounded like diverticulitis but CT scan showed several distal ureteral stones ~2cm above the UVJ w/o hydronephrosis suggesting non-obstructive stones (largest= 5.70mm)- see above for mult other CT findings including bilat renal cysts, right adrenal adenoma, & mult sm layering gallstones;  We increased his Flomax0.4mg - take 2Qhs for medical expulsive therapy & referred him back to Urology... ~  2/15:  On Flomax0.4 & Vesicare5; Pt reports that he saw Urology after last OV, agreed w/ incr Flomax 0.4mg - 2Qhs; pt thinks he passed 1-2 stones, & not further problems reported...  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  BACK PAIN, LUMBAR (ICD-724.2) - s/p L4-5 laminectomy and microdiscectomy 5/01 by Tor Netters- pt is reluctant to return since he was told he'd need rods & doesn't want them; he tried shots yrs ago w/o benefit...  CERVICAL SPONDYLOSIS WITHOUT MYELOPATHY (ICD-721.0) - he also has known CSpine spondylosis and foramenal stenosis- followed by [name]...  ANEMIA, PERNICIOUS (ICD-281.0) - on B 12 shots monthly... ~  labs 5/09 showed B12= 602 ~  labs 6/10 showed B12= >1500 ~  labs 1/12 showed Hg= 15.9, B12= 497 ~  Labs 1/13 showed Hg= 16.7, B12 level >1500 & he tells me he takes B12 tabs Bid as well (he likes it that way). ~  Labs 11/13 showed Hg= 15.2 ~  Labs 2/15 on B12 shots monthly showed Hg= 16.4, MCV=88, B12 level= 765 ~  Labs 2/16 on B12 shots monthly showed Hg= 16.1, MCV=84, B12= 538   Past Surgical History  Procedure Laterality Date  . Appendectomy    . Surgery for removal of intercostal cyst  1997  . Lumbar laminectomy l4-5  2001    Dr. Luiz Ochoa  . Cts surgery on right      Dr. Amedeo Plenty. Carpal tunnel tunnel  . Prostate biopsy, then  brachytherapy  04/2010    Dr. Rosana Hoes  . Prostate surgery    . Lung surgery      /w Dr. Arlyce Dice  . Lumbar laminectomy  03/07/2014    L3 L4   DR Ronnald Ramp  . Maximum access (mas)posterior lumbar interbody fusion (plif) 1 level N/A 03/07/2014    Procedure: FOR MAXIMUM ACCESS (MAS) POSTERIOR LUMBAR INTERBODY FUSION (PLIF) 1 LEVEL;  Surgeon: [name], MD;  Location: [facility];  Service: Neurosurgery;  Laterality: N/A;  FOR MAXIMUM ACCESS (MAS) POSTERIOR LUMBAR INTERBODY FUSION (PLIF) 1 LEVEL    Outpatient Encounter Prescriptions as of 10/21/2015  Medication Sig  . amLODipine (NORVASC) 5 MG tablet Take 1 tablet (5 mg total) by mouth daily.  Joseph Carter aspirin 81 MG tablet Take 81 mg by mouth daily.   . cyanocobalamin (,VITAMIN B-12,) 1000 MCG/ML injection Inject 1,000 mcg into the muscle every 30 (thirty) days.  . fluocinonide-emollient (LIDEX-E) 0.05 % cream Apply 1 application topically 2 (two) times daily. (Patient taking differently: Apply 1 application topically 2 (two) times daily as needed. )  . Multiple Vitamins-Minerals (CENTRUM SILVER PO) Take 1 tablet by mouth daily.    . tamsulosin (FLOMAX) 0.4 MG CAPS capsule Take 1 capsule (0.4 mg total) by mouth at bedtime.  . valsartan (DIOVAN) 160 MG tablet Take 1 tablet (160 mg total) by mouth daily.  . [DISCONTINUED] amLODipine (NORVASC) 5 MG tablet  take 1 tablet by mouth once daily  . [DISCONTINUED] tamsulosin (FLOMAX) 0.4 MG CAPS capsule take 1 capsule by mouth at bedtime  . [DISCONTINUED] valsartan (DIOVAN) 160 MG tablet take 1 tablet by mouth once daily  . diazepam (VALIUM) 5 MG tablet Take 1 tablet (5 mg total) by mouth every 6 (six) hours as needed for muscle spasms. (Patient not taking: Reported on 10/21/2015)  . oxyCODONE-acetaminophen (PERCOCET/ROXICET) 5-325 MG per tablet Take 1-2 tablets by mouth every 4 (four) hours as needed for moderate pain or severe pain. (Patient not taking: Reported on 10/21/2015)   No facility-administered encounter  medications on file as of 10/21/2015.    Allergies  Allergen Reactions  . Lisinopril     REACTION: Hx of ACE cough...    Current Medications, Allergies, Past Medical History, Past Surgical History, Family History, and Social History were reviewed in [employer] record.   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    Review of Systems         The patient complains of abd discomfort, malaise, dyspnea on exertion, urinary frequency, nocturia, joint pain, stiffness, and arthritis.  The patient denies fever, chills, sweats, anorexia, fatigue, weakness, weight loss, sleep disorder, blurring, diplopia, eye irritation, eye discharge, vision loss, eye pain, photophobia, earache, ear discharge, tinnitus, decreased hearing, nasal congestion, nosebleeds, sore throat, hoarseness, chest pain, palpitations, syncope, orthopnea, PND, peripheral edema, cough, dyspnea at rest, excessive sputum, hemoptysis, wheezing, pleurisy, nausea, vomiting, diarrhea, change in bowel habits, melena, hematochezia, jaundice, gas/bloating, indigestion/ heartburn, dysphagia, odynophagia, dysuria, hematuria, urinary hesitancy, incontinence, joint swelling, muscle cramps, muscle weakness, restless legs, leg pain at night, leg pain with exertion, rash, itching, dryness, suspicious lesions, paralysis, paresthesias, seizures, tremors, vertigo, transient blindness, frequent falls, frequent headaches, difficulty walking, depression, anxiety, memory loss, confusion, cold intolerance, heat intolerance, polydipsia, polyphagia, polyuria, unusual weight change, abnormal bruising, bleeding, enlarged lymph nodes, urticaria, allergic rash, hay fever, and recurrent infections.   Objective:   Physical Exam    WD, WN, 80 y/o WM in NAD... GENERAL:  Alert & oriented; pleasant & cooperative... HEENT:  /AT, , EACs-clear, TMs-wnl, NOSE-clear, THROAT-clear & wnl. NECK:   Supple w/ fairROM; no JVD; normal carotid impulses w/o bruits; no thyromegaly or nodules palpated; no lymphadenopathy. CHEST:  Clear to P & A; without wheezes/ rales/ or rhonchi heard... HEART:  Regular Rhythm; without murmurs/ rubs/ or gallops detected... ABDOMEN:  Soft & nontender; normal bowel sounds; no organomegaly or masses palpated... EXT: without deformities, mild arthritic changes; no varicose veins/ venous insuffic/ or edema. NEURO:  CN's intact;  no focal neuro deficits... DERM:  No lesions noted; no rash etc...  RADIOLOGY DATA:  Reviewed in the EPIC EMR & discussed w/ the patient...  LABORATORY DATA:  Reviewed in the EPIC EMR & discussed w/ the patient...   Assessment:      COPD, Hx Asbestos Exposure>  He quit smoking ~1980, CXR has been stable w/ post op changes, NAD...  HBP>  Controlled on his 2 meds, continue same...  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  GU> BPH, Prostate Cancer, Hypogonadism>  Followed by [id number] at [facility] on [place], pt not on Testos replacement rx; he requested PSA here 2/16 = 0.08...  DJD, LBP, Cervical Spondylosis>  He manages quite well w/ OTC analgesics as needed...  Hx PA/ B12 Defic>  His B12 level is =538 now on monthly B12 shots, continue same...     Plan:     Patient's Medications  Joseph Prescriptions   No medications on file  Previous Medications   ASPIRIN 81 MG TABLET    Take 81 mg by mouth daily.    CYANOCOBALAMIN (,VITAMIN B-12,) 1000 MCG/ML INJECTION    Inject 1,000 mcg into the muscle every 30 (thirty) days.   DIAZEPAM (VALIUM) 5 MG TABLET    Take 1 tablet (5 mg total) by mouth every 6 (six) hours as needed for muscle spasms.   FLUOCINONIDE-EMOLLIENT (LIDEX-E) 0.05 % CREAM    Apply 1 application topically 2 (two) times  daily.   MULTIPLE VITAMINS-MINERALS (CENTRUM SILVER PO)    Take 1 tablet by mouth daily.     OXYCODONE-ACETAMINOPHEN (PERCOCET/ROXICET) 5-325 MG PER TABLET    Take 1-2 tablets by mouth every 4 (four) hours as needed for moderate pain or severe pain.  Modified Medications   Modified Medication Previous Medication   AMLODIPINE (NORVASC) 5 MG TABLET amLODipine (NORVASC) 5 MG tablet      Take 1 tablet (5 mg total) by mouth daily.    take 1 tablet by mouth once daily   TAMSULOSIN (FLOMAX) 0.4 MG CAPS CAPSULE tamsulosin (FLOMAX) 0.4 MG CAPS capsule      Take 1 capsule (0.4 mg total) by mouth at bedtime.    take 1 capsule by mouth at bedtime   VALSARTAN (DIOVAN) 160 MG TABLET valsartan (DIOVAN) 160 MG tablet      Take 1 tablet (160 mg total) by mouth daily.    take 1 tablet by mouth once daily  Discontinued Medications   No medications on file

## 2015-11-11 ENCOUNTER — Other Ambulatory Visit: Payer: Self-pay | Admitting: Pulmonary Disease

## 2015-11-14 ENCOUNTER — Other Ambulatory Visit: Payer: Self-pay | Admitting: Pulmonary Disease

## 2015-11-18 ENCOUNTER — Ambulatory Visit (INDEPENDENT_AMBULATORY_CARE_PROVIDER_SITE_OTHER): Payer: Medicare Other

## 2015-11-18 DIAGNOSIS — E538 Deficiency of other specified B group vitamins: Secondary | ICD-10-CM

## 2015-11-18 MED ORDER — CYANOCOBALAMIN 1000 MCG/ML IJ SOLN
1000.0000 ug | Freq: Once | INTRAMUSCULAR | Status: AC
  Administered 2015-11-18: 1000 ug via INTRAMUSCULAR

## 2015-12-12 ENCOUNTER — Other Ambulatory Visit: Payer: Self-pay | Admitting: Pulmonary Disease

## 2015-12-21 ENCOUNTER — Ambulatory Visit (INDEPENDENT_AMBULATORY_CARE_PROVIDER_SITE_OTHER): Payer: Medicare Other

## 2015-12-21 DIAGNOSIS — D510 Vitamin B12 deficiency anemia due to intrinsic factor deficiency: Secondary | ICD-10-CM | POA: Diagnosis not present

## 2015-12-21 MED ORDER — CYANOCOBALAMIN 1000 MCG/ML IJ SOLN
1000.0000 ug | Freq: Once | INTRAMUSCULAR | Status: AC
  Administered 2015-12-21: 1000 ug via INTRAMUSCULAR

## 2016-01-18 ENCOUNTER — Ambulatory Visit (INDEPENDENT_AMBULATORY_CARE_PROVIDER_SITE_OTHER): Payer: Medicare Other

## 2016-01-18 DIAGNOSIS — D510 Vitamin B12 deficiency anemia due to intrinsic factor deficiency: Secondary | ICD-10-CM

## 2016-01-18 MED ORDER — CYANOCOBALAMIN 1000 MCG/ML IJ SOLN
1000.0000 ug | Freq: Once | INTRAMUSCULAR | Status: AC
  Administered 2016-01-18: 1000 ug via INTRAMUSCULAR

## 2016-02-15 ENCOUNTER — Ambulatory Visit (INDEPENDENT_AMBULATORY_CARE_PROVIDER_SITE_OTHER): Payer: Medicare Other

## 2016-02-15 DIAGNOSIS — D510 Vitamin B12 deficiency anemia due to intrinsic factor deficiency: Secondary | ICD-10-CM

## 2016-02-15 MED ORDER — CYANOCOBALAMIN 1000 MCG/ML IJ SOLN
1000.0000 ug | Freq: Once | INTRAMUSCULAR | Status: AC
  Administered 2016-02-15: 1000 ug via INTRAMUSCULAR

## 2016-03-18 ENCOUNTER — Ambulatory Visit (INDEPENDENT_AMBULATORY_CARE_PROVIDER_SITE_OTHER): Payer: Medicare Other

## 2016-03-18 ENCOUNTER — Other Ambulatory Visit: Payer: Self-pay | Admitting: Emergency Medicine

## 2016-03-18 DIAGNOSIS — E538 Deficiency of other specified B group vitamins: Secondary | ICD-10-CM | POA: Diagnosis not present

## 2016-03-18 MED ORDER — TAMSULOSIN HCL 0.4 MG PO CAPS: 0.4000 mg | ORAL_CAPSULE | Freq: Every day | ORAL | Status: DC

## 2016-03-18 MED ORDER — CYANOCOBALAMIN 1000 MCG/ML IJ SOLN
1000.0000 ug | Freq: Once | INTRAMUSCULAR | Status: AC
  Administered 2016-03-18: 1000 ug via INTRAMUSCULAR

## 2016-03-21 ENCOUNTER — Encounter: Payer: Self-pay | Admitting: Pulmonary Disease

## 2016-03-21 ENCOUNTER — Other Ambulatory Visit (INDEPENDENT_AMBULATORY_CARE_PROVIDER_SITE_OTHER): Payer: Medicare Other

## 2016-03-21 ENCOUNTER — Ambulatory Visit (INDEPENDENT_AMBULATORY_CARE_PROVIDER_SITE_OTHER): Payer: Medicare Other | Admitting: Pulmonary Disease

## 2016-03-21 ENCOUNTER — Ambulatory Visit (INDEPENDENT_AMBULATORY_CARE_PROVIDER_SITE_OTHER)
Admission: RE | Admit: 2016-03-21 | Discharge: 2016-03-21 | Disposition: A | Discharge status: Home / Self Care | Payer: Medicare Other | Source: Ambulatory Visit | Attending: Pulmonary Disease | Admitting: Pulmonary Disease

## 2016-03-21 VITALS — BP 138/80 | HR 68 | Temp 97.3°F | Ht 76.0 in | Wt 216.2 lb

## 2016-03-21 DIAGNOSIS — J4489 Other specified chronic obstructive pulmonary disease: Secondary | ICD-10-CM

## 2016-03-21 DIAGNOSIS — J449 Chronic obstructive pulmonary disease, unspecified: Secondary | ICD-10-CM

## 2016-03-21 DIAGNOSIS — F411 Generalized anxiety disorder: Secondary | ICD-10-CM

## 2016-03-21 DIAGNOSIS — D510 Vitamin B12 deficiency anemia due to intrinsic factor deficiency: Secondary | ICD-10-CM

## 2016-03-21 DIAGNOSIS — M545 Low back pain, unspecified: Secondary | ICD-10-CM

## 2016-03-21 DIAGNOSIS — C61 Malignant neoplasm of prostate: Secondary | ICD-10-CM | POA: Diagnosis not present

## 2016-03-21 DIAGNOSIS — I10 Essential (primary) hypertension: Secondary | ICD-10-CM

## 2016-03-21 DIAGNOSIS — M159 Polyosteoarthritis, unspecified: Secondary | ICD-10-CM

## 2016-03-21 DIAGNOSIS — E785 Hyperlipidemia, unspecified: Secondary | ICD-10-CM

## 2016-03-21 DIAGNOSIS — Z77090 Contact with and (suspected) exposure to asbestos: Secondary | ICD-10-CM | POA: Diagnosis not present

## 2016-03-21 DIAGNOSIS — E213 Hyperparathyroidism, unspecified: Secondary | ICD-10-CM

## 2016-03-21 DIAGNOSIS — J9811 Atelectasis: Secondary | ICD-10-CM | POA: Diagnosis not present

## 2016-03-21 DIAGNOSIS — R269 Unspecified abnormalities of gait and mobility: Secondary | ICD-10-CM

## 2016-03-21 DIAGNOSIS — Z981 Arthrodesis status: Secondary | ICD-10-CM

## 2016-03-21 DIAGNOSIS — M150 Primary generalized (osteo)arthritis: Secondary | ICD-10-CM

## 2016-03-21 LAB — COMPREHENSIVE METABOLIC PANEL
ALK PHOS: 78 U/L (ref 39–117)
ALT: 12 U/L (ref 0–53)
AST: 14 U/L (ref 0–37)
Albumin: 4.3 g/dL (ref 3.5–5.2)
BILIRUBIN TOTAL: 1.4 mg/dL — AB (ref 0.2–1.2)
BUN: 22 mg/dL (ref 6–23)
CALCIUM: 11.5 mg/dL — AB (ref 8.4–10.5)
CO2: 25 meq/L (ref 19–32)
CREATININE: 1.38 mg/dL (ref 0.40–1.50)
Chloride: 110 mEq/L (ref 96–112)
GFR: 52.45 mL/min — AB (ref >60.00)
GLUCOSE: 122 mg/dL — AB (ref 70–99)
Potassium: 5.2 mEq/L — ABNORMAL HIGH (ref 3.5–5.1)
Sodium: 141 mEq/L (ref 135–145)
TOTAL PROTEIN: 7.5 g/dL (ref 6.0–8.3)

## 2016-03-21 LAB — CBC WITH DIFFERENTIAL/PLATELET
BASOS ABS: 0 10*3/uL (ref 0.0–0.1)
Basophils Relative: 0.4 % (ref 0.0–3.0)
EOS ABS: 0.2 10*3/uL (ref 0.0–0.7)
Eosinophils Relative: 2.2 % (ref 0.0–5.0)
HEMATOCRIT: 49.3 % (ref 39.0–52.0)
HEMOGLOBIN: 16.8 g/dL (ref 13.0–17.0)
LYMPHS PCT: 22 % (ref 12.0–46.0)
Lymphs Abs: 2.3 10*3/uL (ref 0.7–4.0)
MCHC: 34 g/dL (ref 30.0–36.0)
MCV: 85.8 fl (ref 78.0–100.0)
MONOS PCT: 5.3 % (ref 3.0–12.0)
Monocytes Absolute: 0.6 10*3/uL (ref 0.1–1.0)
NEUTROS ABS: 7.4 10*3/uL (ref 1.4–7.7)
Neutrophils Relative %: 70.1 % (ref 43.0–77.0)
PLATELETS: 194 10*3/uL (ref 150.0–400.0)
RBC: 5.74 Mil/uL (ref 4.22–5.81)
RDW: 13.6 % (ref 11.5–15.5)
WBC: 10.5 10*3/uL (ref 4.0–10.5)

## 2016-03-21 LAB — TSH: TSH: 2.09 u[IU]/mL (ref 0.35–4.50)

## 2016-03-21 LAB — PSA: PSA: 0.04 ng/mL — AB (ref 0.10–4.00)

## 2016-03-21 LAB — LIPID PANEL
CHOL/HDL RATIO: 5
Cholesterol: 162 mg/dL (ref 0–200)
HDL: 32.5 mg/dL — AB (ref >39.00)
LDL Cholesterol: 106 mg/dL — ABNORMAL HIGH (ref 0–99)
NONHDL: 129.37
Triglycerides: 117 mg/dL (ref 0.0–149.0)
VLDL: 23.4 mg/dL (ref 0.0–40.0)

## 2016-03-21 LAB — VITAMIN B12: Vitamin B-12: 972 pg/mL — ABNORMAL HIGH (ref 211–911)

## 2016-03-21 NOTE — Patient Instructions (Signed)
Today we updated your med list in our EPIC system...    Continue your current medications the same...  Today we did your follow up CXR & FASTING blood work...    We will contact you w/ the results when available...   Stay as active as possible but be careful...  Call for any questions...  Let's plan a follow up visit in 48mo, sooner if needed for problems.Marland KitchenMarland Kitchen

## 2016-03-22 ENCOUNTER — Encounter: Payer: Self-pay | Admitting: Pulmonary Disease

## 2016-03-22 NOTE — Progress Notes (Signed)
Subjective:     Patient ID: Joseph Carter, male   DOB: 09-Nov-1932, 82 y.o.   MRN: [id number]  HPI 82 y/o WM here for a follow up visit... he has multiple medical problems as noted below...    ~  October 03, 2012:  14mo ROV & Eileen presents w/ a 62mo hx of pain in the abd> initially seen at [facility]) & told he had gallstones (we don't have these records or the scan report); he recently saw [facility] for his regular Urology f/u & he rec that [facility] f/u w/ [name] because it sounded more like diverticulitis; pain has been occurring off&on since June, predom left sided (LUQ & flank), pains lasts several days & seems to resolve on it's own, then returns, denies n/v/d but has some constip & notes ?intermit relief w/ BM; pt had Colonoscopy by [facility] 1/13- 72mm polyp in desc colon (tubular adenoma), mod diverticulosis in sigmoid, radiation proctitis in rectum;  He also had appendectomy at age 23;  Denies dysuria, hematuria, or colicky renal pain...  We reviewed the need for Labs & CT Abd&Pelvis- see below...    MildCOPD/ HxAsbestos exposure/ s/p resection of intercostal cyst 1997> breathing is good, no cough/ phlegm/ hemoptysis/ CP/ dyspnea/ etc...    HBP> controlled on Amlodip5, Diovan160; BP= 140/78 & denies CP, palpit, SOB, edema, etc...    Chol> on diet alone & labs 1/13 ok x LDL=120    Hx prostate cancer/ LUTS> s/p brachytherapy per [facility] 2011 & followed by [name] on Flomax0.4 & Vesicare5; pt tells me his PSA has been falling & when last measured by Urology ~0.3    DJD, LBP, Cx Spondylosis> all followed by [name]; he is very stoic & not taking pain meds...    PA> on B12 shots monthly... We reviewed prob list, meds, xrays and labs> see below for updates >>   LABS 10/03/12:  Chems- ok x BUN=25 Creat=1.5 K=5.5;  CBC- wnl;  TSH=1.84;  Sed=16;  UA= trace heme, few rbc/wbc/bact...  CT Abd&Pelvis 10/05/12 showed several distal left ureteral calculi- 2cm above UVJ, largest 5.85mm size, no hydronephrosis  therefore NON-obstructing at present;  Mult other findings on the CT in this 82 y/o man> basilar atx/ scarring, coronary & abd ao/ iliac calcif, bilat renal cysts, mult layering gallstones, 1.7cm right adrenal adenoma, sigmoid diverticulosis w/o inflamm, intraprostatic therapy seeds, no adenopathy, sm fat containing LIH, degen changes in spine...   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     We reviewed prob list, meds, xrays and labs> see below for updates >> he had the 2014 Flu vaccine in Oct...  CXR 2/15 showed borderline cardiomeg, metallic clips and old right rib fractures, pleuroparenchymal scarring, NAD.Marland KitchenMarland Kitchen  EKG 2/15 showed [place], [employer], old IWMI, NAD...  LABS 2/15:  FLP- at goals x LDL=122 on diet rx;  Chems- ok x Cr=1.4;  CBC- wnl;  TSH=2.46;  PSA=0.13;  B12=765...    ~  January 08, 2015:  35yr Joseph Carter had Back Surg 4/15 by [name] & he is improved- more mobile, less pain, but  upset that extended family hasn't helped him very much;  He fell 1/16 (tripped into one of his tractors)- didn't go to ER or see his Ortho etc- "I toughed it out" and improved now... We reviewed the following medical problems during today's office visit >>     COPD/ HxAsbestos exposure/ s/p resection of intercostal cyst 1997> breathing is good, no cough/ phlegm/ hemoptysis/ CP/ dyspnea/ etc; he is not on meds and hasn't had any recent exac...    HBP> controlled on Amlodip5, Diovan160; BP= 142/86 & denies CP, palpit, dizzy, SOB, edema, etc...    Ven Insuffic> reminded to avoid sodium, elev legs, wear support hose...    Chol> on diet alone & labs 2/16 showed TChol  174, TG 117, HDL 37, LDL 114    GI- Divertics> followed by [name]- colonoscopy 1/13 showed 80mm desc colon polyp (tub adenoma), mod sigm divertics, radiation proctitis...    Hx prostate cancer/ BPH w/ LUTS/ Kidney Stones> s/p brachytherapy per [facility] 2011 & followed by [name] on Flomax0.4 & off Vesicare; labs 2/16 showed PSA =0.08, BUN=25, Cr=1.41    DJD, LBP, Cx Spondylosis> on Tramadol & Percocet; all followed by [name]; s/p lumbar decompression & fusion L3-4 for sp stenosis by DrJones 4/15...    PA> on B12 shots monthly...    Anxiety> on Valium5 prn...  We reviewed prob list, meds, xrays and labs> see below for updates >>   LABS 2/16:  FLP- ok w/ LDL=114;  Chems- ok w/ BS=113, Cr=1.41;  CBC- wnl;  TSH=2.97;  B12=538;  PSA=0.08...  ~  October 21, 2015:  28mo ROV & medical f/u visit> Joseph Carter returns for a routine f/u visit feeling well w/o new complaints or concerns; he states that his breathing is good, min cough/sput, no hemoptysis, stable DOE- sl better he thinks, no CP etc;  He sleeps well, good energy, mows yard/ tractor work, 20 acres; he has no children but raised 3 neices/nephews... We reviewed the following medical problems during today's office visit >>     COPD/ HxAsbestos exposure/ s/p resection of intercostal cyst 1997> breathing is good, no cough/ phlegm/ hemoptysis/ CP/ dyspnea/ etc; he is not on meds and hasn't had any recent exac...    HBP> controlled on ASA81, Amlodip5, Diovan160; BP= 142/80 & denies CP, palpit, dizzy, SOB, edema, etc...    Ven Insuffic> reminded to avoid sodium, elev legs, wear support hose...    Chol> on diet alone & labs 2/16 showed TChol 174, TG 117, HDL 37, LDL 114    GI- Divertics> followed by [name]- colonoscopy 1/13 showed 100mm desc colon polyp (tub adenoma), mod sigm divertics, radiation proctitis...    Hx prostate cancer/ BPH w/ LUTS/ Kidney Stones> s/p brachytherapy per [facility] 2011 & followed by [name] on Flomax0.4 & off Vesicare; labs 2/16  showed PSA =0.08, BUN=25, Cr=1.41    DJD, LBP, Cx Spondylosis> on Tramadol & Percocet; all followed by [name]; s/p lumbar decompression & fusion L3-4 for sp stenosis by DrJones 4/15...    PA> on B12 shots monthly...    Anxiety> prev on Valium5 prn...  EXAM shows Afeb, VSS, O2sat=97% on RA;  HEENT- neg, mallampati2;  Chest- clear w/o w/r/r;  Heart- RR w/o m/r/g;  Abd- soft, nontender, neg;  Ext- neg w/o c/c/e;  Neuro- intact... IMP/PLAN>>  Kanin remains stable at 83 on the above meds; advised to continue same + diet/exercise & call for any problems...   ~  March 21, 2016:  32mo ROV & Joseph Carter notes that breathing  is good, no change; mild cough & clear mucus in the AMs; he denies dyspnea/ SOB/ CP/ chest tightness/ etc...     BP controlled on Diovan, Amlod & measures 138/80 today w/o HA, CP, palpit, dizzy, SOB, edema...    Chol treated w/ diet alone & FLP today shows TChol 162, TG 117, HDL 33, LDL 106...    Hx prostate cancer, & BPH- on Flomax & still followed by [id number], [facility]; Labs today shows PSA=0.04    He is getting B12 shots Qmo & B12 level is norm at 972... EXAM shows Afeb, VSS, O2sat=97% on RA;  HEENT- neg, mallampati2;  Chest- clear w/o w/r/r;  Heart- RR w/o m/r/g;  Abd- soft, nontender, neg;  Ext- neg w/o c/c/e;  Neuro- intact...  LABS 02/2016>  FLP- ok x HDL=33, LDL=106;  Chems- ok x BS=122, Ca=11.5;  CBC- wnl;  TSH=2.09;  PSA=0.04;  B12=972  CXR 03/21/16>  Norm heart size, mild basilar atx, chr deformity of right ribs w/ pleural thickening- no change; surg staples over right upper lung field...  Recheck Labs 03/31/16>  Ca=11.0, Phos=2.2, PTH=135  >>  I called pt & discussed in detail- this is diagnostic of hyperparathyroidism & next step is to refer to endocrine surgeon for treatment but he requests brief period of observation w/ ROV in 75mo & repeat labs before proceeding w/ surgery...  IMP/PLAN>>  Labs are diagnostic for primary hyperpara- refer to [facility] for further eval & Rx; pt declines  referral at this time, wants to wait w/ period of observ first- we plan recheck in 35mo...            Problem List:  COPD ([id number]) - ex-smoker (quit~1980) w/ hx COPD... no active symptoms... doing satis, no meds, no recent exac- denies SOB, CP, edema, etc- min AM cough/ no phlegm/ no blood etc...  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 ~  CXR 1/12 showed s/p surg on right, chr changes, NAD.[name]. ~  CXR 1/13 showed clear lungs, post surg changes on right w/o interval change, NAD.[name]. ~  CXR 2/15 & 4/15 showed borderline cardiomeg, metallic clips and old right rib fractures, pleuroparenchymal scarring, NAD...  HYPERTENSION (ICD-401.9) - on ASA 81mg /d, NORVASC 5mg /d and DIOVAN 160mg /d...  ~  + FamHx CAD- Bro w/ CABG... ~  2DEcho 9/98 showed mild LVH, norm LVF, mild AI... ~  1/12:  BP= 144/82 but all 130's/ 80's at home; denies HA, fatigue, visual changes, CP, palipit, dizziness, syncope, dyspnea, edema, etc... ~  1/13:  BP= 138/76 & he remains essentially asymptomatic... ~  2/15: on Amlod5 & Diovan160;  BP= 148/60 & he denies CP, palpit, SOB, edema, etc... ~  2/16: controlled on Amlodip5, Diovan160; BP= 142/86 & denies CP, palpit, dizzy, SOB, edema, etc  Hx of PAC (ICD-427.61) - neg cardiac eval 1998... he takes ASA 81mg /d... ~  EKG 2/15 showed SBrady, rate=58, qs in III & aVF, low voltage...  HYPERCHOLESTEROLEMIA, BORDERLINE (ICD-272.4) ~  FLP 9/07 showed TChol 161, TG 54, HDL 33, LDL 117... on diet + exercise Rx... ~  [place] 5/09 showed TChol 155, TG 73, HDL 28, LDL 112 ~  FLP 6/10 showed TChol 160, TG 82, HDL 34, LDL 110 ~  FLP 1/12 showed TChol 159, TG 86, HDL 33, LDL 109 ~  FLP 1/13 on diet alone showed TChol 171, TG 68, HDL 37, LDL 120 ~  FLP 2/15 on diet alone  showed TChol 177, TG 100, HDL 35, LDL 122 ~  FLP 2/16 on diet alone showed TChol 174, TG 117, HDL 37, LDL 114   DIVERTICULAR  DISEASE (ICD-562.10) - colonoscopy 4/03 by Demetra Shiner showed divertics only... f/u rec 10 yrs. ~  1/13: f/u colonoscopy showed 79mm desc colon polyp (tub adenoma), mod sigm divertics, radiation proctitis...  BENIGN PROSTATIC HYPERTROPHY, HX OF (ICD-V13.8) ADENOCARCINOMA, PROSTATE (ICD-185) HYPOGONADISM (ICD-257.2) - prev hx of intol to Doxazosin, therefore changed to [facility] 0.4mg /d but states no benefit to his decr stream so he stopped it; only mild LTOS & denies incontinence, freq, nocturia... ~  labs 4/08 showed PSA= 2.91 ~  labs 5/09 showed PSA= 3.58 ~  labs 6/10 showed PSA= 4.31 (note: Testosterone level= 255)... refer to Urology, DrDavis ==> & eventual Dx Prostate Cancer & he decided on Brachytherapy per DrManning  (done 6/11)... ~  pt tells me he continues to follow up w/ DrDavis Q65mo but we don't have notes from him... ~  1/13: pt reports that DrDavis started Pontoosuc and that the combination is helping his urination; last seen 9/12 & he reports that PSA was "ok"... ~  11/13:  Pt reports that his PSA is down to 0.3 per Urology; NOTE> labs here showed BUN=25 Creat=1.5 K=5.5  ~  2/15:  ?when he last saw Urology, [facility] (we do not have notes from them); he requests labs to be done here today> PSA=0.13, Cr=1.4... ~  2/16: s/p brachytherapy per DrManning 2011 & followed by [name] on Flomax0.4 & off Vesicare; labs 2/16 showed PSA =0.08, BUN=25, Cr=1.41  Left URETERAL STONES/  Bilat RENAL CYSTS >> ~  11/13:  Pt presented w/ 57mo hx intermittent left sided abd / flank pain & eval from [facility] showing gallstones; DrDavis referred pt here for further eval of what sounded like diverticulitis but CT scan showed several distal ureteral stones ~2cm above the UVJ w/o hydronephrosis suggesting non-obstructive stones (largest= 5.21mm)- see above for mult other CT findings including bilat renal cysts, right adrenal adenoma, & mult sm layering gallstones;  We increased his Flomax0.4mg - take 2Qhs  for medical expulsive therapy & referred him back to Urology... ~  2/15:  On Flomax0.4 & Vesicare5; Pt reports that he saw Urology after last OV, agreed w/ incr Flomax 0.4mg - 2Qhs; pt thinks he passed 1-2 stones, & not further problems reported...  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  BACK PAIN, LUMBAR (ICD-724.2) - s/p L4-5 laminectomy and microdiscectomy 5/01 by Tor Netters- pt is reluctant to return since he was told he'd need rods & doesn't want them; he tried shots yrs ago w/o benefit...  CERVICAL SPONDYLOSIS WITHOUT MYELOPATHY (ICD-721.0) - he also has known CSpine spondylosis and foramenal stenosis- followed by [name]...  ANEMIA, PERNICIOUS (ICD-281.0) - on B 12 shots monthly... ~  labs 5/09 showed B12= 602 ~  labs 6/10 showed B12= >1500 ~  labs 1/12 showed Hg= 15.9, B12= 497 ~  Labs 1/13 showed Hg= 16.7, B12 level >1500 & he tells me he takes B12 tabs Bid as well (he likes it that way). ~  Labs 11/13 showed Hg= 15.2 ~  Labs 2/15 on B12 shots monthly showed Hg= 16.4, MCV=88, B12 level= 765 ~  Labs 2/16 on B12 shots monthly showed Hg= 16.1, MCV=84, B12= 538   Past Surgical History  Procedure Laterality Date  . Appendectomy    .  Surgery for removal of intercostal cyst  1997  . Lumbar laminectomy l4-5  2001    Dr. Luiz Ochoa  . Cts surgery on right      Dr. Amedeo Plenty. Carpal tunnel tunnel  . Prostate biopsy, then brachytherapy  04/2010    Dr. Rosana Hoes  . Prostate surgery    . Lung surgery      /w Dr. Arlyce Dice  . Lumbar laminectomy  03/07/2014    L3 L4   DR Ronnald Ramp  . Maximum access (mas)posterior lumbar interbody fusion (plif) 1 level N/A 03/07/2014    Procedure: FOR MAXIMUM ACCESS (MAS) POSTERIOR LUMBAR INTERBODY FUSION (PLIF) 1 LEVEL;  Surgeon: [name], MD;  Location: [facility];  Service: Neurosurgery;  Laterality: N/A;  FOR MAXIMUM ACCESS (MAS) POSTERIOR LUMBAR INTERBODY FUSION  (PLIF) 1 LEVEL    Outpatient Encounter Prescriptions as of 03/21/2016  Medication Sig  . amLODipine (NORVASC) 5 MG tablet take 1 tablet by mouth once daily  . aspirin 81 MG tablet Take 81 mg by mouth daily.   . cyanocobalamin (,VITAMIN B-12,) 1000 MCG/ML injection Inject 1,000 mcg into the muscle every 30 (thirty) days.  . Multiple Vitamins-Minerals (CENTRUM SILVER PO) Take 1 tablet by mouth daily.    . tamsulosin (FLOMAX) 0.4 MG CAPS capsule Take 1 capsule (0.4 mg total) by mouth at bedtime.  . valsartan (DIOVAN) 160 MG tablet Take 1 tablet (160 mg total) by mouth daily.  . fluocinonide-emollient (LIDEX-E) 0.05 % cream Apply 1 application topically 2 (two) times daily. (Patient not taking: Reported on 03/21/2016)  . [DISCONTINUED] amLODipine (NORVASC) 5 MG tablet Take 1 tablet (5 mg total) by mouth daily.  . [DISCONTINUED] diazepam (VALIUM) 5 MG tablet Take 1 tablet (5 mg total) by mouth every 6 (six) hours as needed for muscle spasms. (Patient not taking: Reported on 10/21/2015)  . [DISCONTINUED] oxyCODONE-acetaminophen (PERCOCET/ROXICET) 5-325 MG per tablet Take 1-2 tablets by mouth every 4 (four) hours as needed for moderate pain or severe pain. (Patient not taking: Reported on 10/21/2015)   No facility-administered encounter medications on file as of 03/21/2016.    Allergies  Allergen Reactions  . Lisinopril     REACTION: Hx of ACE cough...    Current Medications, Allergies, Past Medical History, Past Surgical History, Family History, and Social History were reviewed in [employer] record.   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    Review of Systems         The patient complains of abd discomfort, malaise, dyspnea on exertion, urinary frequency, nocturia, joint pain, stiffness, and arthritis.  The patient denies fever, chills, sweats, anorexia, fatigue, weakness, weight loss, sleep disorder, blurring,  diplopia, eye irritation, eye discharge, vision loss, eye pain, photophobia, earache, ear discharge, tinnitus, decreased hearing, nasal congestion, nosebleeds, sore throat, hoarseness, chest pain, palpitations, syncope, orthopnea, PND, peripheral edema, cough, dyspnea at rest, excessive sputum, hemoptysis, wheezing, pleurisy, nausea, vomiting, diarrhea, change in bowel habits, melena, hematochezia, jaundice, gas/bloating, indigestion/ heartburn, dysphagia, odynophagia, dysuria, hematuria, urinary hesitancy, incontinence, joint swelling, muscle cramps, muscle weakness, restless legs, leg pain at night, leg pain with exertion, rash, itching, dryness, suspicious lesions, paralysis, paresthesias, seizures, tremors, vertigo, transient blindness, frequent falls, frequent headaches, difficulty walking, depression, anxiety, memory loss, confusion, cold intolerance, heat intolerance, polydipsia, polyphagia, polyuria, unusual weight change, abnormal bruising, bleeding, enlarged lymph nodes, urticaria, allergic rash, hay fever, and recurrent infections.   Objective:   Physical Exam  WD, WN, 82 y/o WM in NAD... GENERAL:  Alert & oriented; pleasant & cooperative... HEENT:  /AT, , EACs-clear, TMs-wnl, NOSE-clear, THROAT-clear & wnl. NECK:  Supple w/ fairROM; no JVD; normal carotid impulses w/o bruits; no thyromegaly or nodules palpated; no lymphadenopathy. CHEST:  Clear to P & A; without wheezes/ rales/ or rhonchi heard... HEART:  Regular Rhythm; without murmurs/ rubs/ or gallops detected... ABDOMEN:  Soft & nontender; normal bowel sounds; no organomegaly or masses palpated... EXT: without deformities, mild arthritic changes; no varicose veins/ venous insuffic/ or edema. NEURO:  CN's intact;  no focal neuro deficits... DERM:  No lesions noted; no rash etc...  RADIOLOGY DATA:  Reviewed in the EPIC EMR & discussed w/ the patient...  LABORATORY DATA:  Reviewed in the EPIC EMR & discussed w/ the  patient...   Assessment:      COPD, Hx Asbestos Exposure>  He quit smoking ~1980, CXR has been stable w/ post op changes, NAD...  HBP>  Controlled on his 2 meds, continue same...  CHOL>  Controlled on diet alone & LDL= 114  GI> Divertics, Polyp, radiation proctitis> he had f/u colonoscopy from [facility] 1/13- 61mm polyp in desc colon (tubular adenoma), mod diverticulosis in sigmoid, radiation proctitis in rectum.  GU> BPH, Prostate Cancer, Hypogonadism>  Followed by [id number] at [facility] on [address], pt not on Testos replacement rx; he requested PSA here 2/16 = 0.08...  DJD, LBP, Cervical Spondylosis>  He manages quite well w/ OTC analgesics as needed...  Hx PA/ B12 Defic>  His B12 level is =538 now on monthly B12 shots, continue same...     Plan:     Patient's Medications  New Prescriptions   No medications on file  Previous Medications   AMLODIPINE (NORVASC) 5 MG TABLET    take 1 tablet by mouth once daily   ASPIRIN 81 MG TABLET    Take 81 mg by mouth daily.    CYANOCOBALAMIN (,VITAMIN B-12,) 1000 MCG/ML INJECTION    Inject 1,000 mcg into the muscle every 30 (thirty) days.   FLUOCINONIDE-EMOLLIENT (LIDEX-E) 0.05 % CREAM    Apply 1 application topically 2 (two) times daily.   MULTIPLE VITAMINS-MINERALS (CENTRUM SILVER PO)    Take 1 tablet by mouth daily.     TAMSULOSIN (FLOMAX) 0.4 MG CAPS CAPSULE    Take 1 capsule (0.4 mg total) by mouth at bedtime.   VALSARTAN (DIOVAN) 160 MG TABLET    Take 1 tablet (160 mg total) by mouth daily.  Modified Medications   No medications on file  Discontinued Medications   AMLODIPINE (NORVASC) 5 MG TABLET    Take 1 tablet (5 mg total) by mouth daily.   DIAZEPAM (VALIUM) 5 MG TABLET    Take 1 tablet (5 mg total) by mouth every 6 (six) hours as needed for muscle spasms.   OXYCODONE-ACETAMINOPHEN (PERCOCET/ROXICET) 5-325 MG PER TABLET    Take 1-2 tablets by mouth every 4 (four) hours as needed for moderate pain or severe pain.

## 2016-03-24 NOTE — Progress Notes (Signed)
Quick Note:  lmtcb for pt. ______ 

## 2016-03-31 ENCOUNTER — Other Ambulatory Visit (INDEPENDENT_AMBULATORY_CARE_PROVIDER_SITE_OTHER): Payer: Medicare Other

## 2016-03-31 ENCOUNTER — Other Ambulatory Visit: Payer: Self-pay | Admitting: Pulmonary Disease

## 2016-03-31 ENCOUNTER — Telehealth: Payer: Self-pay | Admitting: Pulmonary Disease

## 2016-03-31 DIAGNOSIS — I10 Essential (primary) hypertension: Secondary | ICD-10-CM | POA: Diagnosis not present

## 2016-03-31 LAB — CALCIUM: CALCIUM: 11 mg/dL — AB (ref 8.4–10.5)

## 2016-03-31 LAB — PHOSPHORUS: PHOSPHORUS: 2.2 mg/dL — AB (ref 2.3–4.6)

## 2016-03-31 NOTE — Telephone Encounter (Signed)
Received call from [facility] requesting pt's labs be placed as he is currently in the lab. Orders have been placed. Nothing further needed at this time.   Notes Recorded by Noralee Space, MD on 03/23/2016 at 3:40 PM Please notify patient>  FLP is OK on diet alone... Labs show norm CBC/ most blood chemistries/ and thyroid.- Renal function is stable... PSA remains low, B12 is normal on shots... However Serum CALCIUM is mod elev at 11.5 ?etiology-  REC> he needs to ret to our lab one morning soon for further blood test= Calcium, Phosphorus, PTH level & we will call.Marland KitchenMarland Kitchen

## 2016-04-01 LAB — PTH, INTACT AND CALCIUM
Calcium: 10.6 mg/dL — ABNORMAL HIGH (ref 8.4–10.5)
PTH: 135 pg/mL — AB (ref 14–64)

## 2016-04-18 ENCOUNTER — Ambulatory Visit (INDEPENDENT_AMBULATORY_CARE_PROVIDER_SITE_OTHER): Payer: Medicare Other

## 2016-04-18 DIAGNOSIS — E538 Deficiency of other specified B group vitamins: Secondary | ICD-10-CM

## 2016-04-18 MED ORDER — CYANOCOBALAMIN 1000 MCG/ML IJ SOLN
1000.0000 ug | Freq: Once | INTRAMUSCULAR | Status: AC
  Administered 2016-04-18: 1000 ug via INTRAMUSCULAR

## 2016-05-19 ENCOUNTER — Ambulatory Visit (INDEPENDENT_AMBULATORY_CARE_PROVIDER_SITE_OTHER): Payer: Medicare Other

## 2016-05-19 DIAGNOSIS — E538 Deficiency of other specified B group vitamins: Secondary | ICD-10-CM

## 2016-05-20 MED ORDER — CYANOCOBALAMIN 1000 MCG/ML IJ SOLN
1000.0000 ug | Freq: Once | INTRAMUSCULAR | Status: AC
  Administered 2016-05-18: 1000 ug via INTRAMUSCULAR

## 2016-06-09 ENCOUNTER — Other Ambulatory Visit: Payer: Self-pay | Admitting: *Deleted

## 2016-06-09 MED ORDER — TAMSULOSIN HCL 0.4 MG PO CAPS: 0.4000 mg | ORAL_CAPSULE | Freq: Every day | ORAL | Status: DC

## 2016-06-20 ENCOUNTER — Ambulatory Visit (INDEPENDENT_AMBULATORY_CARE_PROVIDER_SITE_OTHER): Payer: Medicare Other

## 2016-06-20 DIAGNOSIS — D510 Vitamin B12 deficiency anemia due to intrinsic factor deficiency: Secondary | ICD-10-CM | POA: Diagnosis not present

## 2016-06-22 MED ORDER — CYANOCOBALAMIN 1000 MCG/ML IJ SOLN
1000.0000 ug | Freq: Once | INTRAMUSCULAR | Status: AC
  Administered 2016-06-20: 1000 ug via INTRAMUSCULAR

## 2016-07-21 ENCOUNTER — Ambulatory Visit (INDEPENDENT_AMBULATORY_CARE_PROVIDER_SITE_OTHER): Payer: Medicare Other

## 2016-07-21 ENCOUNTER — Telehealth: Payer: Self-pay | Admitting: Pulmonary Disease

## 2016-07-21 DIAGNOSIS — E538 Deficiency of other specified B group vitamins: Secondary | ICD-10-CM | POA: Diagnosis not present

## 2016-07-21 DIAGNOSIS — I10 Essential (primary) hypertension: Secondary | ICD-10-CM

## 2016-07-21 MED ORDER — CYANOCOBALAMIN 1000 MCG/ML IJ SOLN
1000.0000 ug | Freq: Once | INTRAMUSCULAR | Status: AC
  Administered 2016-07-21: 1000 ug via INTRAMUSCULAR

## 2016-07-21 NOTE — Telephone Encounter (Signed)
lmotcb x 1  

## 2016-07-21 NOTE — Telephone Encounter (Signed)
Pt called back and he stated that SN wanted him to come back for labs after labor day.  SN please advise of the labs that you would like the pt to have. thanks

## 2016-07-25 NOTE — Telephone Encounter (Signed)
Per SN---  Ok to order labs for the pt.   cmet Phosphorus pth level  I have called the pt and he is aware that he can come in for these labs.

## 2016-08-03 ENCOUNTER — Other Ambulatory Visit (INDEPENDENT_AMBULATORY_CARE_PROVIDER_SITE_OTHER): Payer: Medicare Other

## 2016-08-03 DIAGNOSIS — I10 Essential (primary) hypertension: Secondary | ICD-10-CM

## 2016-08-03 LAB — COMPREHENSIVE METABOLIC PANEL
ALT: 11 U/L (ref 0–53)
AST: 13 U/L (ref 0–37)
Albumin: 4.2 g/dL (ref 3.5–5.2)
Alkaline Phosphatase: 78 U/L (ref 39–117)
BUN: 26 mg/dL — ABNORMAL HIGH (ref 6–23)
CALCIUM: 10.7 mg/dL — AB (ref 8.4–10.5)
CHLORIDE: 110 meq/L (ref 96–112)
CO2: 24 meq/L (ref 19–32)
CREATININE: 1.39 mg/dL (ref 0.40–1.50)
GFR: 51.97 mL/min — ABNORMAL LOW (ref >60.00)
GLUCOSE: 112 mg/dL — AB (ref 70–99)
Potassium: 4.9 mEq/L (ref 3.5–5.1)
Sodium: 140 mEq/L (ref 135–145)
Total Bilirubin: 1.5 mg/dL — ABNORMAL HIGH (ref 0.2–1.2)
Total Protein: 7.2 g/dL (ref 6.0–8.3)

## 2016-08-03 LAB — PHOSPHORUS: Phosphorus: 2.4 mg/dL (ref 2.3–4.6)

## 2016-08-04 LAB — PTH, INTACT AND CALCIUM
CALCIUM: 10.9 mg/dL — AB (ref 8.6–10.3)
PTH: 147 pg/mL — AB (ref 14–64)

## 2016-08-07 ENCOUNTER — Other Ambulatory Visit: Payer: Self-pay | Admitting: Pulmonary Disease

## 2016-08-15 ENCOUNTER — Other Ambulatory Visit: Payer: Self-pay

## 2016-08-15 DIAGNOSIS — E213 Hyperparathyroidism, unspecified: Secondary | ICD-10-CM

## 2016-08-22 ENCOUNTER — Ambulatory Visit (INDEPENDENT_AMBULATORY_CARE_PROVIDER_SITE_OTHER): Payer: Medicare Other

## 2016-08-22 DIAGNOSIS — D510 Vitamin B12 deficiency anemia due to intrinsic factor deficiency: Secondary | ICD-10-CM

## 2016-08-22 DIAGNOSIS — Z23 Encounter for immunization: Secondary | ICD-10-CM

## 2016-08-22 MED ORDER — CYANOCOBALAMIN 1000 MCG/ML IJ SOLN
1000.0000 ug | Freq: Once | INTRAMUSCULAR | Status: AC
  Administered 2016-08-22: 1000 ug via INTRAMUSCULAR

## 2016-09-06 ENCOUNTER — Other Ambulatory Visit: Payer: Self-pay | Admitting: Pulmonary Disease

## 2016-09-07 ENCOUNTER — Other Ambulatory Visit (HOSPITAL_COMMUNITY): Payer: Self-pay | Admitting: Surgery

## 2016-09-07 DIAGNOSIS — E210 Primary hyperparathyroidism: Secondary | ICD-10-CM

## 2016-09-19 DIAGNOSIS — E213 Hyperparathyroidism, unspecified: Secondary | ICD-10-CM | POA: Insufficient documentation

## 2016-09-20 ENCOUNTER — Ambulatory Visit (INDEPENDENT_AMBULATORY_CARE_PROVIDER_SITE_OTHER): Payer: Medicare Other | Admitting: Pulmonary Disease

## 2016-09-20 VITALS — BP 144/90 | HR 77 | Temp 97.3°F | Ht 76.0 in | Wt 213.0 lb

## 2016-09-20 DIAGNOSIS — M545 Low back pain: Secondary | ICD-10-CM

## 2016-09-20 DIAGNOSIS — R269 Unspecified abnormalities of gait and mobility: Secondary | ICD-10-CM

## 2016-09-20 DIAGNOSIS — E782 Mixed hyperlipidemia: Secondary | ICD-10-CM

## 2016-09-20 DIAGNOSIS — Z77090 Contact with and (suspected) exposure to asbestos: Secondary | ICD-10-CM

## 2016-09-20 DIAGNOSIS — E213 Hyperparathyroidism, unspecified: Secondary | ICD-10-CM

## 2016-09-20 DIAGNOSIS — J449 Chronic obstructive pulmonary disease, unspecified: Secondary | ICD-10-CM

## 2016-09-20 DIAGNOSIS — I10 Essential (primary) hypertension: Secondary | ICD-10-CM

## 2016-09-20 DIAGNOSIS — G8929 Other chronic pain: Secondary | ICD-10-CM

## 2016-09-20 DIAGNOSIS — C61 Malignant neoplasm of prostate: Secondary | ICD-10-CM

## 2016-09-20 MED ORDER — PNEUMOCOCCAL 13-VAL CONJ VACC IM SUSP
0.5000 mL | INTRAMUSCULAR | Status: AC
  Administered 2016-09-20: 0.5 mL via INTRAMUSCULAR

## 2016-09-20 NOTE — Patient Instructions (Signed)
Today we updated your med list in our EPIC system...    Continue your current medications the same...  We gave you the new PNEUMONIA vaccine today- PREVNAR-13 (this is the last pneumonia shot you will need)...  Stay as active as poss...  Call for any questions...  Let's plan a follow up visit in 45mo, sooner if needed for problems.Marland KitchenMarland Kitchen

## 2016-09-21 ENCOUNTER — Ambulatory Visit (INDEPENDENT_AMBULATORY_CARE_PROVIDER_SITE_OTHER): Payer: Medicare Other

## 2016-09-21 DIAGNOSIS — D510 Vitamin B12 deficiency anemia due to intrinsic factor deficiency: Secondary | ICD-10-CM | POA: Diagnosis not present

## 2016-09-21 MED ORDER — CYANOCOBALAMIN 1000 MCG/ML IJ SOLN
1000.0000 ug | Freq: Once | INTRAMUSCULAR | Status: AC
Start: 2016-09-21 — End: 2016-09-21
  Administered 2016-09-21: 1000 ug via INTRAMUSCULAR

## 2016-09-22 ENCOUNTER — Encounter (HOSPITAL_COMMUNITY)
Admission: RE | Admit: 2016-09-22 | Discharge: 2016-09-22 | Disposition: A | Discharge status: Home / Self Care | Payer: Medicare Other | Source: Ambulatory Visit | Attending: Surgery | Admitting: Surgery

## 2016-09-22 DIAGNOSIS — E210 Primary hyperparathyroidism: Secondary | ICD-10-CM | POA: Insufficient documentation

## 2016-09-22 MED ORDER — TECHNETIUM TC 99M SESTAMIBI GENERIC - CARDIOLITE
25.0000 | Freq: Once | INTRAVENOUS | Status: AC | PRN
  Administered 2016-09-22: 25 via INTRAVENOUS

## 2016-10-10 ENCOUNTER — Ambulatory Visit: Payer: Self-pay | Admitting: Surgery

## 2016-10-24 ENCOUNTER — Ambulatory Visit (INDEPENDENT_AMBULATORY_CARE_PROVIDER_SITE_OTHER): Payer: Medicare Other

## 2016-10-24 DIAGNOSIS — D510 Vitamin B12 deficiency anemia due to intrinsic factor deficiency: Secondary | ICD-10-CM | POA: Diagnosis not present

## 2016-10-24 MED ORDER — CYANOCOBALAMIN 1000 MCG/ML IJ SOLN
1000.0000 ug | Freq: Once | INTRAMUSCULAR | Status: AC
  Administered 2016-10-24: 1000 ug via INTRAMUSCULAR

## 2016-11-03 ENCOUNTER — Encounter (HOSPITAL_COMMUNITY)
Admission: RE | Admit: 2016-11-03 | Discharge: 2016-11-03 | Disposition: A | Discharge status: Home / Self Care | Payer: Medicare Other | Source: Ambulatory Visit | Attending: Surgery | Admitting: Surgery

## 2016-11-03 ENCOUNTER — Ambulatory Visit (HOSPITAL_COMMUNITY)
Admission: RE | Admit: 2016-11-03 | Discharge: 2016-11-08 | Disposition: A | Discharge status: Home / Self Care | Payer: Medicare Other | Source: Ambulatory Visit | Attending: Surgery | Admitting: Surgery

## 2016-11-03 ENCOUNTER — Encounter (HOSPITAL_COMMUNITY): Payer: Self-pay

## 2016-11-03 ENCOUNTER — Ambulatory Visit (HOSPITAL_COMMUNITY)
Admission: RE | Admit: 2016-11-03 | Discharge: 2016-11-03 | Disposition: A | Discharge status: Home / Self Care | Payer: Medicare Other | Source: Ambulatory Visit | Attending: Anesthesiology | Admitting: Anesthesiology

## 2016-11-03 DIAGNOSIS — Z79899 Other long term (current) drug therapy: Secondary | ICD-10-CM | POA: Insufficient documentation

## 2016-11-03 DIAGNOSIS — Z01812 Encounter for preprocedural laboratory examination: Secondary | ICD-10-CM | POA: Diagnosis present

## 2016-11-03 DIAGNOSIS — J449 Chronic obstructive pulmonary disease, unspecified: Secondary | ICD-10-CM | POA: Insufficient documentation

## 2016-11-03 DIAGNOSIS — Z87442 Personal history of urinary calculi: Secondary | ICD-10-CM | POA: Insufficient documentation

## 2016-11-03 DIAGNOSIS — Z8249 Family history of ischemic heart disease and other diseases of the circulatory system: Secondary | ICD-10-CM | POA: Insufficient documentation

## 2016-11-03 DIAGNOSIS — E210 Primary hyperparathyroidism: Secondary | ICD-10-CM | POA: Insufficient documentation

## 2016-11-03 DIAGNOSIS — Z9889 Other specified postprocedural states: Secondary | ICD-10-CM | POA: Insufficient documentation

## 2016-11-03 DIAGNOSIS — D351 Benign neoplasm of parathyroid gland: Secondary | ICD-10-CM | POA: Insufficient documentation

## 2016-11-03 DIAGNOSIS — Z01818 Encounter for other preprocedural examination: Secondary | ICD-10-CM | POA: Insufficient documentation

## 2016-11-03 DIAGNOSIS — I10 Essential (primary) hypertension: Secondary | ICD-10-CM | POA: Insufficient documentation

## 2016-11-03 DIAGNOSIS — R9389 Abnormal findings on diagnostic imaging of other specified body structures: Secondary | ICD-10-CM

## 2016-11-03 DIAGNOSIS — Z888 Allergy status to other drugs, medicaments and biological substances status: Secondary | ICD-10-CM | POA: Insufficient documentation

## 2016-11-03 DIAGNOSIS — Z01810 Encounter for preprocedural cardiovascular examination: Secondary | ICD-10-CM | POA: Insufficient documentation

## 2016-11-03 DIAGNOSIS — Z87891 Personal history of nicotine dependence: Secondary | ICD-10-CM | POA: Insufficient documentation

## 2016-11-03 DIAGNOSIS — Z833 Family history of diabetes mellitus: Secondary | ICD-10-CM | POA: Insufficient documentation

## 2016-11-03 DIAGNOSIS — Z8261 Family history of arthritis: Secondary | ICD-10-CM | POA: Insufficient documentation

## 2016-11-03 DIAGNOSIS — E213 Hyperparathyroidism, unspecified: Secondary | ICD-10-CM | POA: Diagnosis present

## 2016-11-03 DIAGNOSIS — N400 Enlarged prostate without lower urinary tract symptoms: Secondary | ICD-10-CM | POA: Insufficient documentation

## 2016-11-03 DIAGNOSIS — Z7982 Long term (current) use of aspirin: Secondary | ICD-10-CM | POA: Insufficient documentation

## 2016-11-03 LAB — CBC
HCT: 46.7 % (ref 39.0–52.0)
Hemoglobin: 16 g/dL (ref 13.0–17.0)
MCH: 29.6 pg (ref 26.0–34.0)
MCHC: 34.3 g/dL (ref 30.0–36.0)
MCV: 86.3 fL (ref 78.0–100.0)
PLATELETS: 158 10*3/uL (ref 150–400)
RBC: 5.41 MIL/uL (ref 4.22–5.81)
RDW: 13.3 % (ref 11.5–15.5)
WBC: 8.7 10*3/uL (ref 4.0–10.5)

## 2016-11-03 LAB — BASIC METABOLIC PANEL WITH GFR
Anion gap: 6 (ref 5–15)
BUN: 23 mg/dL — ABNORMAL HIGH (ref 6–20)
CO2: 24 mmol/L (ref 22–32)
Calcium: 11 mg/dL — ABNORMAL HIGH (ref 8.9–10.3)
Chloride: 110 mmol/L (ref 101–111)
Creatinine, Ser: 1.33 mg/dL — ABNORMAL HIGH (ref 0.61–1.24)
GFR calc Af Amer: 56 mL/min — ABNORMAL LOW
GFR calc non Af Amer: 48 mL/min — ABNORMAL LOW
Glucose, Bld: 97 mg/dL (ref 65–99)
Potassium: 5.3 mmol/L — ABNORMAL HIGH (ref 3.5–5.1)
Sodium: 140 mmol/L (ref 135–145)

## 2016-11-03 NOTE — Patient Instructions (Signed)
Joseph Carter  11/03/2016   Your procedure is scheduled on: Tuesday November 08, 2016  Report to [facility] Main  Entrance take [place]  elevators to 3rd floor to  [place] at 5:30 AM.  Call this number if you have problems the morning of surgery [phone]   Remember: ONLY 1 PERSON MAY GO WITH YOU TO SHORT STAY TO GET  READY MORNING OF [place].  Do not eat food or drink liquids :After Midnight.     Take these medicines the morning of surgery with A SIP OF WATER: Amlodipine (Norvasc)                                You may not have any metal on your body including hair pins and              piercings  Do not wear jewelry,  lotions, powders or colognes, deodorant                     Men may shave face and neck.   Do not bring valuables to the hospital. [place].  Contacts, dentures or bridgework may not be worn into surgery.      Patients discharged the day of surgery will not be allowed to drive home.  Name and phone number of your driver:Joseph Carter (wife)  _____________________________________________________________________             [facility] - Preparing for Surgery Before surgery, you can play an important role.  Because skin is not sterile, your skin needs to be as free of germs as possible.  You can reduce the number of germs on your skin by washing with CHG (chlorahexidine gluconate) soap before surgery.  CHG is an antiseptic cleaner which kills germs and bonds with the skin to continue killing germs even after washing. Please DO NOT use if you have an allergy to CHG or antibacterial soaps.  If your skin becomes reddened/irritated stop using the CHG and inform your nurse when you arrive at Short Stay. Do not shave (including legs and underarms) for at least 48 hours prior to the first CHG shower.  You may shave your face/neck. Please follow these instructions carefully:  1.  Shower  with CHG Soap the night before surgery and the  morning of Surgery.  2.  If you choose to wash your hair, wash your hair first as usual with your  normal  shampoo.  3.  After you shampoo, rinse your hair and body thoroughly to remove the  shampoo.                           4.  Use CHG as you would any other liquid soap.  You can apply chg directly  to the skin and wash                       Gently with a scrungie or clean washcloth.  5.  Apply the CHG Soap to your body ONLY FROM THE NECK DOWN.   Do not use on face/ open  Wound or open sores. Avoid contact with eyes, ears mouth and genitals (private parts).                       Wash face,  Genitals (private parts) with your normal soap.             6.  Wash thoroughly, paying special attention to the area where your surgery  will be performed.  7.  Thoroughly rinse your body with warm water from the neck down.  8.  DO NOT shower/wash with your normal soap after using and rinsing off  the CHG Soap.                9.  Pat yourself dry with a clean towel.            10.  Wear clean pajamas.            11.  Place clean sheets on your bed the night of your first shower and do not  sleep with pets. Day of Surgery : Do not apply any lotions/deodorants the morning of surgery.  Please wear clean clothes to the hospital/surgery center.  FAILURE TO FOLLOW THESE INSTRUCTIONS MAY RESULT IN THE CANCELLATION OF YOUR SURGERY PATIENT SIGNATURE_________________________________  NURSE SIGNATURE__________________________________  ________________________________________________________________________

## 2016-11-03 NOTE — Progress Notes (Signed)
BMP results in epic per PAT visit 11/03/2016 sent to Dr Harlow Asa

## 2016-11-07 ENCOUNTER — Encounter (HOSPITAL_COMMUNITY): Payer: Self-pay | Admitting: Surgery

## 2016-11-07 NOTE — H&P (Signed)
General Surgery [facility]  Lamere N. Ege DOB: [birth year] Married / Language: English / Race: White Male   History of Present Illness Patient words: parathyroid.  The patient is a 82 year old male who presents with a parathyroid neoplasm.  Patient is referred by Dr. Teressa Lower for evaluation and management of primary hyperparathyroidism. Patient had been noted on routine laboratory studies to have elevated calcium levels ranging from 10.6-11.5. Additional laboratory studies showed an elevated intact PTH level ranging from 135-147. Patient has a history of nephrolithiasis approximately 3-4 years ago. He complains of dizziness. He complains of mild fatigue. He complains of right knee discomfort and is walking using a cane. Other diagnostic studies have not yet been performed. Patient has no prior history of head or neck surgery. There is no family history of endocrine disease or other endocrine neoplasm.   Other Problems  Enlarged Prostate  High blood pressure  Kidney Stone   Past Surgical History TURP   Diagnostic Studies History Colonoscopy  5-10 years ago  Allergies  Lisinopril *ANTIHYPERTENSIVES*   Medication History AmLODIPine Besylate (5MG  Tablet, Oral) Active. Tamsulosin HCl (0.4MG  Capsule, Oral) Active. Valsartan (160MG  Tablet, Oral) Active. Aspirin (81MG  Tablet DR, Oral) Active. Cyanocobalamin (1000MCG/ML Solution, Injection) Active. Medications Reconciled  Social History  Alcohol use  Moderate alcohol use. Caffeine use  Carbonated beverages, Coffee. Tobacco use  Former smoker.  Family History  Arthritis  Father. Diabetes Mellitus  Mother. Heart Disease  Brother, Mother.    Review of Systems General Present- Fatigue. Not Present- Appetite Loss, Chills, Fever, Night Sweats, Weight Gain and Weight Loss. Skin Not Present- Change in Wart/Mole, Dryness, Hives, Jaundice, New Lesions, Non-Healing Wounds, Rash and  Ulcer. HEENT Present- Hearing Loss and Wears glasses/contact lenses. Not Present- Earache, Hoarseness, Nose Bleed, Oral Ulcers, Ringing in the Ears, Seasonal Allergies, Sinus Pain, Sore Throat, Visual Disturbances and Yellow Eyes. Breast Not Present- Breast Mass, Breast Pain, Nipple Discharge and Skin Changes. Cardiovascular Not Present- Chest Pain, Difficulty Breathing Lying Down, Leg Cramps, Palpitations, Rapid Heart Rate, Shortness of Breath and Swelling of Extremities. Gastrointestinal Not Present- Abdominal Pain, Bloating, Bloody Stool, Change in Bowel Habits, Chronic diarrhea, Constipation, Difficulty Swallowing, Excessive gas, Gets full quickly at meals, Hemorrhoids, Indigestion, Nausea, Rectal Pain and Vomiting. Male Genitourinary Not Present- Blood in Urine, Change in Urinary Stream, Frequency, Impotence, Nocturia, Painful Urination, Urgency and Urine Leakage. Musculoskeletal Present- Back Pain, Muscle Pain and Muscle Weakness. Not Present- Joint Pain, Joint Stiffness and Swelling of Extremities. Neurological Present- Decreased Memory. Not Present- Fainting, Headaches, Numbness, Seizures, Tingling, Tremor, Trouble walking and Weakness. Psychiatric Not Present- Anxiety, Bipolar, Change in Sleep Pattern, Depression, Fearful and Frequent crying. Endocrine Not Present- Cold Intolerance, Excessive Hunger, Hair Changes, Heat Intolerance, Hot flashes and New Diabetes. Hematology Not Present- Blood Thinners, Easy Bruising, Excessive bleeding, Gland problems, HIV and Persistent Infections.  Vitals  Weight: 211.6 lb Height: 76in Body Surface Area: 2.27 m Body Mass Index: 25.76 kg/m  Temp.: 97.31F(Oral)  Pulse: 73 (Regular)  BP: 146/88 (Sitting, Left Arm, Standard)   Physical Exam The physical exam findings are as follows: Note:General - appears comfortable, no distress; not diaphorectic  HEENT - normocephalic; sclerae clear, gaze conjugate; mucous membranes moist, dentition  fair; voice normal  Neck - symmetric on extension; no palpable anterior or posterior cervical adenopathy; no palpable masses in the thyroid bed  Chest - clear bilaterally without rhonchi, rales, or wheeze  Cor - regular rhythm with normal rate; no significant murmur  Ext -  non-tender without significant edema or lymphedema  Neuro - grossly intact; no tremor    Assessment & Plan  PRIMARY HYPERPARATHYROIDISM (E21.0)  Pt Education - Pamphlet Given - The Parathyroid Surgery Book: discussed with patient and provided information. Follow Up - Call [facility] office after tests / studies done to discuss further plans  Patient presents with laboratory evidence of primary hyperparathyroidism. Symptoms include nephrolithiasis, dizziness, fatigue, and joint pain.  I have recommended proceeding with further evaluation of primary hyperparathyroidism. We will obtain a 25-hydroxy vitamin D level. We will obtain a 24-hour urine collection for calcium. Patient was undergoing nuclear medicine parathyroid scan. Once the results of these studies are available, I will contact the patient.  The patient and I discussed minimally invasive parathyroid surgery. We discussed risk and benefits. We discussed the hospital stay to be anticipated. We discussed his postoperative recovery. Patient understands.  Patient will undergo diagnostic testing as outlined above and we will contact him with the results and then make further plans for management.  ADDENDUM:  Vit D and 24 hour urine collection for calcium levels are within normal limits.  Nuclear medicine parathyroid scan localizes a parathyroid adenoma to the left inferior position.  We will plan to proceed with left inferior minimally invasive parathyroidectomy.  The risks and benefits of the procedure have been discussed at length with the patient.  The patient understands the proposed procedure, potential alternative treatments, and the course of recovery to be  expected.  All of the patient's questions have been answered at this time.  The patient wishes to proceed with surgery.  Earnstine Regal, MD, [facility] Office: [phone]

## 2016-11-08 ENCOUNTER — Ambulatory Visit (HOSPITAL_COMMUNITY): Payer: Medicare Other | Admitting: Anesthesiology

## 2016-11-08 ENCOUNTER — Encounter (HOSPITAL_COMMUNITY)
Admission: RE | Disposition: A | Discharge status: Home / Self Care | Payer: Self-pay | Source: Ambulatory Visit | Attending: Surgery

## 2016-11-08 ENCOUNTER — Encounter (HOSPITAL_COMMUNITY): Payer: Self-pay | Admitting: *Deleted

## 2016-11-08 DIAGNOSIS — Z888 Allergy status to other drugs, medicaments and biological substances status: Secondary | ICD-10-CM | POA: Diagnosis not present

## 2016-11-08 DIAGNOSIS — D351 Benign neoplasm of parathyroid gland: Secondary | ICD-10-CM | POA: Diagnosis not present

## 2016-11-08 DIAGNOSIS — Z87891 Personal history of nicotine dependence: Secondary | ICD-10-CM | POA: Diagnosis not present

## 2016-11-08 DIAGNOSIS — Z9889 Other specified postprocedural states: Secondary | ICD-10-CM | POA: Diagnosis not present

## 2016-11-08 DIAGNOSIS — Z833 Family history of diabetes mellitus: Secondary | ICD-10-CM | POA: Diagnosis not present

## 2016-11-08 DIAGNOSIS — N400 Enlarged prostate without lower urinary tract symptoms: Secondary | ICD-10-CM | POA: Diagnosis not present

## 2016-11-08 DIAGNOSIS — E210 Primary hyperparathyroidism: Secondary | ICD-10-CM | POA: Diagnosis not present

## 2016-11-08 DIAGNOSIS — Z79899 Other long term (current) drug therapy: Secondary | ICD-10-CM | POA: Diagnosis not present

## 2016-11-08 DIAGNOSIS — Z7982 Long term (current) use of aspirin: Secondary | ICD-10-CM | POA: Diagnosis not present

## 2016-11-08 DIAGNOSIS — I10 Essential (primary) hypertension: Secondary | ICD-10-CM | POA: Diagnosis not present

## 2016-11-08 DIAGNOSIS — Z8261 Family history of arthritis: Secondary | ICD-10-CM | POA: Diagnosis not present

## 2016-11-08 DIAGNOSIS — Z87442 Personal history of urinary calculi: Secondary | ICD-10-CM | POA: Diagnosis not present

## 2016-11-08 DIAGNOSIS — Z8249 Family history of ischemic heart disease and other diseases of the circulatory system: Secondary | ICD-10-CM | POA: Diagnosis not present

## 2016-11-08 DIAGNOSIS — J449 Chronic obstructive pulmonary disease, unspecified: Secondary | ICD-10-CM | POA: Diagnosis not present

## 2016-11-08 HISTORY — PX: PARATHYROIDECTOMY: SHX19

## 2016-11-08 SURGERY — PARATHYROIDECTOMY
Anesthesia: General | Site: Neck | Laterality: Left

## 2016-11-08 MED ORDER — HYDROCODONE-ACETAMINOPHEN 5-325 MG PO TABS
1.0000 | ORAL_TABLET | Freq: Once | ORAL | Status: AC
  Administered 2016-11-08: 1 via ORAL
  Filled 2016-11-08: qty 1

## 2016-11-08 MED ORDER — ONDANSETRON HCL 4 MG/2ML IJ SOLN
INTRAMUSCULAR | Status: DC | PRN
  Administered 2016-11-08: 4 mg via INTRAVENOUS

## 2016-11-08 MED ORDER — MEPERIDINE HCL 50 MG/ML IJ SOLN: 6.2500 mg | INTRAMUSCULAR | Status: DC | PRN

## 2016-11-08 MED ORDER — ONDANSETRON HCL 4 MG/2ML IJ SOLN
INTRAMUSCULAR | Status: AC
  Filled 2016-11-08: qty 2

## 2016-11-08 MED ORDER — LIDOCAINE 2% (20 MG/ML) 5 ML SYRINGE
INTRAMUSCULAR | Status: AC
  Filled 2016-11-08: qty 5

## 2016-11-08 MED ORDER — LACTATED RINGERS IV SOLN: INTRAVENOUS | Status: DC

## 2016-11-08 MED ORDER — PROPOFOL 10 MG/ML IV BOLUS
INTRAVENOUS | Status: AC
  Filled 2016-11-08: qty 20

## 2016-11-08 MED ORDER — CEFAZOLIN SODIUM-DEXTROSE 2-4 GM/100ML-% IV SOLN
INTRAVENOUS | Status: AC
  Filled 2016-11-08: qty 100

## 2016-11-08 MED ORDER — FENTANYL CITRATE (PF) 100 MCG/2ML IJ SOLN
INTRAMUSCULAR | Status: AC
  Filled 2016-11-08: qty 2

## 2016-11-08 MED ORDER — DEXAMETHASONE SODIUM PHOSPHATE 10 MG/ML IJ SOLN
INTRAMUSCULAR | Status: AC
  Filled 2016-11-08: qty 1

## 2016-11-08 MED ORDER — HYDROCODONE-ACETAMINOPHEN 5-325 MG PO TABS: 1.0000 | ORAL_TABLET | ORAL | 0 refills | Status: DC | PRN

## 2016-11-08 MED ORDER — ROCURONIUM BROMIDE 50 MG/5ML IV SOSY
PREFILLED_SYRINGE | INTRAVENOUS | Status: AC
  Filled 2016-11-08: qty 5

## 2016-11-08 MED ORDER — METOCLOPRAMIDE HCL 5 MG/ML IJ SOLN: 10.0000 mg | Freq: Once | INTRAMUSCULAR | Status: DC | PRN

## 2016-11-08 MED ORDER — DEXAMETHASONE SODIUM PHOSPHATE 10 MG/ML IJ SOLN
INTRAMUSCULAR | Status: DC | PRN
  Administered 2016-11-08: 10 mg via INTRAVENOUS

## 2016-11-08 MED ORDER — EPHEDRINE SULFATE 50 MG/ML IJ SOLN
INTRAMUSCULAR | Status: DC | PRN
  Administered 2016-11-08: 10 mg via INTRAVENOUS
  Administered 2016-11-08: 5 mg via INTRAVENOUS
  Administered 2016-11-08: 10 mg via INTRAVENOUS

## 2016-11-08 MED ORDER — CEFAZOLIN SODIUM-DEXTROSE 2-4 GM/100ML-% IV SOLN
2.0000 g | INTRAVENOUS | Status: AC
  Administered 2016-11-08: 2 g via INTRAVENOUS
  Filled 2016-11-08: qty 100

## 2016-11-08 MED ORDER — ROCURONIUM BROMIDE 50 MG/5ML IV SOSY
PREFILLED_SYRINGE | INTRAVENOUS | Status: DC | PRN
  Administered 2016-11-08: 30 mg via INTRAVENOUS

## 2016-11-08 MED ORDER — SUGAMMADEX SODIUM 200 MG/2ML IV SOLN
INTRAVENOUS | Status: AC
  Filled 2016-11-08: qty 2

## 2016-11-08 MED ORDER — FENTANYL CITRATE (PF) 100 MCG/2ML IJ SOLN
INTRAMUSCULAR | Status: DC | PRN
  Administered 2016-11-08 (×2): 50 ug via INTRAVENOUS

## 2016-11-08 MED ORDER — EPHEDRINE 5 MG/ML INJ
INTRAVENOUS | Status: AC
  Filled 2016-11-08: qty 10

## 2016-11-08 MED ORDER — LIDOCAINE 2% (20 MG/ML) 5 ML SYRINGE
INTRAMUSCULAR | Status: DC | PRN
  Administered 2016-11-08: 60 mg via INTRAVENOUS

## 2016-11-08 MED ORDER — LACTATED RINGERS IV SOLN
INTRAVENOUS | Status: DC | PRN
  Administered 2016-11-08: 07:00:00 via INTRAVENOUS

## 2016-11-08 MED ORDER — FENTANYL CITRATE (PF) 100 MCG/2ML IJ SOLN
25.0000 ug | INTRAMUSCULAR | Status: DC | PRN
  Administered 2016-11-08 (×2): 50 ug via INTRAVENOUS

## 2016-11-08 MED ORDER — BUPIVACAINE HCL 0.25 % IJ SOLN
INTRAMUSCULAR | Status: DC | PRN
  Administered 2016-11-08: 7 mL

## 2016-11-08 MED ORDER — PROPOFOL 10 MG/ML IV BOLUS
INTRAVENOUS | Status: DC | PRN
  Administered 2016-11-08: 150 mg via INTRAVENOUS

## 2016-11-08 MED ORDER — BUPIVACAINE HCL 0.25 % IJ SOLN
INTRAMUSCULAR | Status: AC
  Filled 2016-11-08: qty 1

## 2016-11-08 MED ORDER — 0.9 % SODIUM CHLORIDE (POUR BTL) OPTIME
TOPICAL | Status: DC | PRN
  Administered 2016-11-08: 1000 mL

## 2016-11-08 MED ORDER — SUGAMMADEX SODIUM 200 MG/2ML IV SOLN
INTRAVENOUS | Status: DC | PRN
  Administered 2016-11-08: 20 mg via INTRAVENOUS

## 2016-11-08 MED ORDER — SUCCINYLCHOLINE CHLORIDE 200 MG/10ML IV SOSY
PREFILLED_SYRINGE | INTRAVENOUS | Status: AC
  Filled 2016-11-08: qty 10

## 2016-11-08 MED ORDER — SUCCINYLCHOLINE CHLORIDE 200 MG/10ML IV SOSY
PREFILLED_SYRINGE | INTRAVENOUS | Status: DC | PRN
  Administered 2016-11-08: 120 mg via INTRAVENOUS

## 2016-11-08 SURGICAL SUPPLY — 38 items
ATTRACTOMAT 16X20 MAGNETIC DRP (DRAPES) ×3 IMPLANT
BENZOIN TINCTURE PRP APPL 2/3 (GAUZE/BANDAGES/DRESSINGS) IMPLANT
BLADE HEX COATED 2.75 (ELECTRODE) ×3 IMPLANT
BLADE SURG 15 STRL LF DISP TIS (BLADE) ×1 IMPLANT
BLADE SURG 15 STRL SS (BLADE) ×2
CHLORAPREP W/TINT 26ML (MISCELLANEOUS) ×6 IMPLANT
CLIP TI MEDIUM 6 (CLIP) ×6 IMPLANT
CLIP TI WIDE RED SMALL 6 (CLIP) ×6 IMPLANT
CLOSURE WOUND 1/2 X4 (GAUZE/BANDAGES/DRESSINGS) ×1
COVER SURGICAL LIGHT HANDLE (MISCELLANEOUS) ×3 IMPLANT
DERMABOND ADVANCED (GAUZE/BANDAGES/DRESSINGS)
DERMABOND ADVANCED .7 DNX12 (GAUZE/BANDAGES/DRESSINGS) IMPLANT
DRAPE LAPAROTOMY T 98X78 PEDS (DRAPES) ×3 IMPLANT
ELECT PENCIL ROCKER SW 15FT (MISCELLANEOUS) ×3 IMPLANT
ELECT REM PT RETURN 9FT ADLT (ELECTROSURGICAL) ×3
ELECTRODE REM PT RTRN 9FT ADLT (ELECTROSURGICAL) ×1 IMPLANT
GAUZE SPONGE 4X4 16PLY XRAY LF (GAUZE/BANDAGES/DRESSINGS) ×3 IMPLANT
GLOVE SURG ORTHO 8.0 STRL STRW (GLOVE) ×3 IMPLANT
GOWN STRL REUS W/TWL XL LVL3 (GOWN DISPOSABLE) ×9 IMPLANT
HEMOSTAT SURGICEL 2X4 FIBR (HEMOSTASIS) ×3 IMPLANT
ILLUMINATOR WAVEGUIDE N/F (MISCELLANEOUS) ×3 IMPLANT
KIT BASIN OR (CUSTOM PROCEDURE TRAY) ×3 IMPLANT
LIGHT WAVEGUIDE WIDE FLAT (MISCELLANEOUS) IMPLANT
NEEDLE HYPO 25X1 1.5 SAFETY (NEEDLE) ×3 IMPLANT
PACK BASIC VI WITH GOWN DISP (CUSTOM PROCEDURE TRAY) ×3 IMPLANT
STAPLER VISISTAT 35W (STAPLE) ×3 IMPLANT
STRIP CLOSURE SKIN 1/2X4 (GAUZE/BANDAGES/DRESSINGS) ×2 IMPLANT
SUT MNCRL AB 4-0 PS2 18 (SUTURE) ×3 IMPLANT
SUT SILK 2 0 (SUTURE)
SUT SILK 2-0 18XBRD TIE 12 (SUTURE) IMPLANT
SUT SILK 3 0 (SUTURE)
SUT SILK 3-0 18XBRD TIE 12 (SUTURE) IMPLANT
SUT VIC AB 3-0 SH 18 (SUTURE) ×3 IMPLANT
SYR BULB IRRIGATION 50ML (SYRINGE) ×3 IMPLANT
SYR CONTROL 10ML LL (SYRINGE) ×3 IMPLANT
TOWEL OR 17X26 10 PK STRL BLUE (TOWEL DISPOSABLE) ×3 IMPLANT
TOWEL OR NON WOVEN STRL DISP B (DISPOSABLE) ×3 IMPLANT
YANKAUER SUCT BULB TIP 10FT TU (MISCELLANEOUS) ×3 IMPLANT

## 2016-11-08 NOTE — Anesthesia Procedure Notes (Signed)
Procedure Name: Intubation Date/Time: 11/08/2016 7:36 AM Performed by: Dione Booze Pre-anesthesia Checklist: Patient identified, Emergency Drugs available, Suction available and Patient being monitored Patient Re-evaluated:Patient Re-evaluated prior to inductionOxygen Delivery Method: Circle system utilized Preoxygenation: Pre-oxygenation with 100% oxygen Intubation Type: IV induction Laryngoscope Size: Mac and 4 Grade View: Grade I Tube type: Oral Tube size: 7.5 mm Number of attempts: 1 Airway Equipment and Method: Stylet Placement Confirmation: ETT inserted through vocal cords under direct vision and CO2 detector Secured at: 21 cm Tube secured with: Tape Dental Injury: Teeth and Oropharynx as per pre-operative assessment

## 2016-11-08 NOTE — Anesthesia Postprocedure Evaluation (Signed)
Anesthesia Post Note  Patient: Joseph Carter  Procedure(s) Performed: Procedure(s) (LRB): LEFT INFERIOR MINIMALLY INVASIVE PARATHYROIDECTOMY (Left)  Patient location during evaluation: PACU Anesthesia Type: General Level of consciousness: awake and alert Pain management: pain level controlled Vital Signs Assessment: post-procedure vital signs reviewed and stable Respiratory status: spontaneous breathing, nonlabored ventilation, respiratory function stable and patient connected to nasal cannula oxygen Cardiovascular status: blood pressure returned to baseline and stable Postop Assessment: no signs of nausea or vomiting Anesthetic complications: no    Last Vitals:  Vitals:   11/08/16 0915 11/08/16 0930  BP: 133/89 140/70  Pulse: 90 84  Resp: 19 15  Temp: 36.7 C 36.5 C    Last Pain:  Vitals:   11/08/16 0942  TempSrc:   PainSc: 2                  Montez Hageman

## 2016-11-08 NOTE — Op Note (Signed)
OPERATIVE REPORT - PARATHYROIDECTOMY  Preoperative diagnosis: Primary hyperparathyroidism  Postop diagnosis: Same  Procedure: Left inferior minimally invasive parathyroidectomy  Surgeon:  Earnstine Regal, MD, FACS  Anesthesia: Gen. endotracheal  Estimated blood loss: Minimal  Preparation: ChloraPrep  Indications: The patient is a 82 year old male who presents with a parathyroid neoplasm.  Patient is referred by Dr. Teressa Lower for evaluation and management of primary hyperparathyroidism. Patient had been noted on routine laboratory studies to have elevated calcium levels ranging from 10.6-11.5. Additional laboratory studies showed an elevated intact PTH level ranging from 135-147. Patient has a history of nephrolithiasis approximately 3-4 years ago. Nuclear med scan localized an adenoma to the left inferior position.  Procedure: Patient was prepared in the holding area. He was brought to operating room and placed in a supine position on the operating room table. Following administration of general anesthesia, the patient was positioned and then prepped and draped in the usual strict aseptic fashion. After ascertaining that an adequate level of anesthesia been achieved, a neck incision was made with a #15 blade. Dissection was carried through subcutaneous tissues and platysma. Hemostasis was obtained with the electrocautery. Skin flaps were developed circumferentially and a Weitlander retractor was placed for exposure.  Strap muscles were incised in the midline. Strap muscles were reflected exposing the thyroid lobe. With gentle blunt dissection the thyroid lobe was mobilized.  Dissection was carried through adipose tissue and an enlarged parathyroid gland was identified. It was gently mobilized. Vascular structures were divided between small and medium ligaclips using the Harmonic scalpel. Care was taken to avoid the recurrent laryngeal nerve and the esophagus. The parathyroid gland was  completely excised. It was submitted to pathology where frozen section confirmed parathyroid tissue consistent with adenoma.  Neck was irrigated with warm saline and good hemostasis was noted. Fibrillar was placed in the operative field. Strap muscles were reapproximated in the midline with interrupted 3-0 Vicryl sutures. Platysma was closed with interrupted 3-0 Vicryl sutures. Skin was closed with a running 4-0 Monocryl subcuticular suture. Marcaine was infiltrated circumferentially. Wound was washed and dried and Steri-Strips were applied. Sterile gauze dressings were applied. Patient was awakened from anesthesia and brought to the recovery room. The patient tolerated the procedure well.   Earnstine Regal, MD, [facility]

## 2016-11-08 NOTE — Discharge Instructions (Signed)

## 2016-11-08 NOTE — Anesthesia Preprocedure Evaluation (Signed)
Anesthesia Evaluation  Patient identified by MRN, date of birth, ID band Patient awake, Patient confused and Patient unresponsive    Reviewed: Allergy & Precautions, H&P , NPO status , Patient's Chart, lab work & pertinent test results  Airway Mallampati: I  TM Distance: >3 FB Neck ROM: Full    Dental  (+) Edentulous Upper, Dental Advisory Given   Pulmonary COPD, former smoker,    breath sounds clear to auscultation       Cardiovascular hypertension,  Rhythm:Regular Rate:Normal     Neuro/Psych    GI/Hepatic negative GI ROS, Neg liver ROS,   Endo/Other  negative endocrine ROS  Renal/GU Renal disease     Musculoskeletal   Abdominal   Peds  Hematology   Anesthesia Other Findings   Reproductive/Obstetrics                             Anesthesia Physical  Anesthesia Plan  ASA: III  Anesthesia Plan: General   Post-op Pain Management:    Induction: Intravenous  Airway Management Planned: Oral ETT  Additional Equipment:   Intra-op Plan:   Post-operative Plan: Extubation in OR  Informed Consent: I have reviewed the patients History and Physical, chart, labs and discussed the procedure including the risks, benefits and alternatives for the proposed anesthesia with the patient or authorized representative who has indicated his/her understanding and acceptance.   Dental advisory given  Plan Discussed with: CRNA and Anesthesiologist  Anesthesia Plan Comments:         Anesthesia Quick Evaluation

## 2016-11-08 NOTE — Interval H&P Note (Signed)
History and Physical Interval Note:  11/08/2016 7:12 AM  Joseph Carter  has presented today for surgery, with the diagnosis of Primary hyperthyroidism.  The various methods of treatment have been discussed with the patient and family. After consideration of risks, benefits and other options for treatment, the patient has consented to    Procedure(s): LEFT INFERIOR MINIMALLY INVASIVE PARATHYROIDECTOMY (Left) as a surgical intervention .    The patient's history has been reviewed, patient examined, no change in status, stable for surgery.  I have reviewed the patient's chart and labs.  Questions were answered to the patient's satisfaction.    Earnstine Regal, MD, [facility] Office: [place]

## 2016-11-08 NOTE — Transfer of Care (Signed)
Immediate Anesthesia Transfer of Care Note  Patient: Joseph Carter  Procedure(s) Performed: Procedure(s): LEFT INFERIOR MINIMALLY INVASIVE PARATHYROIDECTOMY (Left)  Patient Location: PACU  Anesthesia Type:General  Level of Consciousness: awake and patient cooperative  Airway & Oxygen Therapy: Patient Spontanous Breathing and Patient connected to face mask oxygen  Post-op Assessment: Report given to RN and Post -op Vital signs reviewed and stable  Post vital signs: Reviewed and stable  Last Vitals:  Vitals:   11/08/16 0548  BP: (!) 142/82  Pulse: 85  Resp: 16  Temp: 36.7 C    Last Pain:  Vitals:   11/08/16 0634  TempSrc:   PainSc: 0-No pain      Patients Stated Pain Goal: 4 ([id number])  Complications: No apparent anesthesia complications

## 2016-11-23 ENCOUNTER — Ambulatory Visit (INDEPENDENT_AMBULATORY_CARE_PROVIDER_SITE_OTHER): Payer: Medicare Other

## 2016-11-23 DIAGNOSIS — D510 Vitamin B12 deficiency anemia due to intrinsic factor deficiency: Secondary | ICD-10-CM | POA: Diagnosis not present

## 2016-11-23 MED ORDER — CYANOCOBALAMIN 1000 MCG/ML IJ SOLN
1000.0000 ug | Freq: Once | INTRAMUSCULAR | Status: AC
  Administered 2016-11-23: 1000 ug via INTRAMUSCULAR

## 2016-11-24 ENCOUNTER — Other Ambulatory Visit: Payer: Self-pay | Admitting: Pulmonary Disease

## 2016-12-26 ENCOUNTER — Ambulatory Visit (INDEPENDENT_AMBULATORY_CARE_PROVIDER_SITE_OTHER): Payer: Medicare Other

## 2016-12-26 DIAGNOSIS — E538 Deficiency of other specified B group vitamins: Secondary | ICD-10-CM | POA: Diagnosis not present

## 2017-01-02 ENCOUNTER — Ambulatory Visit (INDEPENDENT_AMBULATORY_CARE_PROVIDER_SITE_OTHER): Payer: Medicare Other | Admitting: Internal Medicine

## 2017-01-02 ENCOUNTER — Encounter: Payer: Self-pay | Admitting: Internal Medicine

## 2017-01-02 VITALS — BP 150/50 | HR 76 | Temp 97.7°F | Resp 16 | Ht 76.0 in | Wt 221.5 lb

## 2017-01-02 DIAGNOSIS — Z23 Encounter for immunization: Secondary | ICD-10-CM

## 2017-01-02 DIAGNOSIS — I10 Essential (primary) hypertension: Secondary | ICD-10-CM | POA: Diagnosis not present

## 2017-01-02 DIAGNOSIS — D518 Other vitamin B12 deficiency anemias: Secondary | ICD-10-CM | POA: Diagnosis not present

## 2017-01-02 DIAGNOSIS — E213 Hyperparathyroidism, unspecified: Secondary | ICD-10-CM

## 2017-01-02 DIAGNOSIS — D649 Anemia, unspecified: Secondary | ICD-10-CM | POA: Insufficient documentation

## 2017-01-02 NOTE — Patient Instructions (Signed)
Hypertension Hypertension, commonly called high blood pressure, is when the force of blood pumping through your arteries is too strong. Your arteries are the blood vessels that carry blood from your heart throughout your body. A blood pressure reading consists of a higher number over a lower number, such as 110/72. The higher number (systolic) is the pressure inside your arteries when your heart pumps. The lower number (diastolic) is the pressure inside your arteries when your heart relaxes. Ideally you want your blood pressure below 120/80. Hypertension forces your heart to work harder to pump blood. Your arteries may become narrow or stiff. Having untreated or uncontrolled hypertension can cause heart attack, stroke, kidney disease, and other problems. What increases the risk? Some risk factors for high blood pressure are controllable. Others are not. Risk factors you cannot control include:  Race. You may be at higher risk if you are African American.  Age. Risk increases with age.  Gender. Men are at higher risk than women before age 45 years. After age 65, women are at higher risk than men. Risk factors you can control include:  Not getting enough exercise or physical activity.  Being overweight.  Getting too much fat, sugar, calories, or salt in your diet.  Drinking too much alcohol. What are the signs or symptoms? Hypertension does not usually cause signs or symptoms. Extremely high blood pressure (hypertensive crisis) may cause headache, anxiety, shortness of breath, and nosebleed. How is this diagnosed? To check if you have hypertension, your health care provider will measure your blood pressure while you are seated, with your arm held at the level of your heart. It should be measured at least twice using the same arm. Certain conditions can cause a difference in blood pressure between your right and left arms. A blood pressure reading that is higher than normal on one occasion does  not mean that you need treatment. If it is not clear whether you have high blood pressure, you may be asked to return on a different day to have your blood pressure checked again. Or, you may be asked to monitor your blood pressure at home for 1 or more weeks. How is this treated? Treating high blood pressure includes making lifestyle changes and possibly taking medicine. Living a healthy lifestyle can help lower high blood pressure. You may need to change some of your habits. Lifestyle changes may include:  Following the DASH diet. This diet is high in fruits, vegetables, and whole grains. It is low in salt, red meat, and added sugars.  Keep your sodium intake below 2,300 mg per day.  Getting at least 30-45 minutes of aerobic exercise at least 4 times per week.  Losing weight if necessary.  Not smoking.  Limiting alcoholic beverages.  Learning ways to reduce stress. Your health care provider may prescribe medicine if lifestyle changes are not enough to get your blood pressure under control, and if one of the following is true:  You are 18-59 years of age and your systolic blood pressure is above 140.  You are 60 years of age or older, and your systolic blood pressure is above 150.  Your diastolic blood pressure is above 90.  You have diabetes, and your systolic blood pressure is over 140 or your diastolic blood pressure is over 90.  You have kidney disease and your blood pressure is above 140/90.  You have heart disease and your blood pressure is above 140/90. Your personal target blood pressure may vary depending on your medical   conditions, your age, and other factors. Follow these instructions at home:  Have your blood pressure rechecked as directed by your health care provider.  Take medicines only as directed by your health care provider. Follow the directions carefully. Blood pressure medicines must be taken as prescribed. The medicine does not work as well when you skip  doses. Skipping doses also puts you at risk for problems.  Do not smoke.  Monitor your blood pressure at home as directed by your health care provider. Contact a health care provider if:  You think you are having a reaction to medicines taken.  You have recurrent headaches or feel dizzy.  You have swelling in your ankles.  You have trouble with your vision. Get help right away if:  You develop a severe headache or confusion.  You have unusual weakness, numbness, or feel faint.  You have severe chest or abdominal pain.  You vomit repeatedly.  You have trouble breathing. This information is not intended to replace advice given to you by your health care provider. Make sure you discuss any questions you have with your health care provider. Document Released: 11/14/2005 Document Revised: 04/21/2016 Document Reviewed: 09/06/2013 Elsevier Interactive Patient Education  2017 [employer]  

## 2017-01-02 NOTE — Progress Notes (Signed)
Subjective:  Patient ID: Joseph Carter, male    DOB: 1932/11/07  Age: 83 y.o. MRN: [id number]  CC: Hypertension   HPI MATTSON SOUTHERN presents for a blood pressure check and establishing with a new primary care physician. He feels well today and offers no complaints. He tells me his blood pressure has been well controlled at home.  Outpatient Medications Prior to Visit  Medication Sig Dispense Refill  . amLODipine (NORVASC) 5 MG tablet take 1 tablet by mouth once daily 30 tablet 6  . aspirin 81 MG tablet Take 81 mg by mouth daily.     . cyanocobalamin (,VITAMIN B-12,) 1000 MCG/ML injection Inject 1,000 mcg into the muscle every 30 (thirty) days.    . Multiple Vitamins-Minerals (CENTRUM SILVER PO) Take 1 tablet by mouth daily.      . tamsulosin (FLOMAX) 0.4 MG CAPS capsule Take 0.4 mg by mouth at bedtime.     . valsartan (DIOVAN) 160 MG tablet take 1 tablet by mouth once daily 90 tablet 2  . HYDROcodone-acetaminophen (NORCO/VICODIN) 5-325 MG tablet Take 1-2 tablets by mouth every 4 (four) hours as needed for moderate pain. 20 tablet 0  . naproxen sodium (ANAPROX) 220 MG tablet Take 440 mg by mouth daily as needed (pain).     No facility-administered medications prior to visit.     ROS Review of Systems  Constitutional: Negative for appetite change, diaphoresis, fatigue and unexpected weight change.  HENT: Negative.  Negative for facial swelling, sinus pressure and trouble swallowing.   Eyes: Negative for visual disturbance.  Respiratory: Negative for cough, chest tightness, shortness of breath and wheezing.   Cardiovascular: Negative for chest pain, palpitations and leg swelling.  Gastrointestinal: Negative for abdominal pain, blood in stool, constipation, diarrhea, nausea and vomiting.  Endocrine: Negative.   Genitourinary: Negative.  Negative for difficulty urinating.  Musculoskeletal: Negative.  Negative for arthralgias, back pain, myalgias and neck pain.  Skin: Negative.   Negative for color change and rash.  Allergic/Immunologic: Negative.   Neurological: Negative.  Negative for dizziness, weakness and light-headedness.  Hematological: Negative.  Negative for adenopathy. Does not bruise/bleed easily.  Psychiatric/Behavioral: Negative.     Objective:  BP (!) 150/50 (BP Location: Left Arm, Patient Position: Sitting, Cuff Size: Large)   Pulse 76   Temp 97.7 F (36.5 C) (Oral)   Resp 16   Ht 6\' 4"  (1.93 m)   Wt 221 lb 8 oz (100.5 kg)   SpO2 98%   BMI 26.96 kg/m   BP Readings from Last 3 Encounters:  01/02/17 (!) 150/50  11/08/16 (!) 143/83  11/03/16 (!) 161/103    Wt Readings from Last 3 Encounters:  01/02/17 221 lb 8 oz (100.5 kg)  11/08/16 216 lb (98 kg)  11/03/16 216 lb 6 oz (98.1 kg)    Physical Exam  Constitutional: He is oriented to person, place, and time. No distress.  HENT:  Mouth/Throat: Oropharynx is clear and moist. No oropharyngeal exudate.  Eyes: Conjunctivae are normal. Right eye exhibits no discharge. Left eye exhibits no discharge. No scleral icterus.  Neck: Normal range of motion. Neck supple. No JVD present. No tracheal deviation present. No thyromegaly present.  Cardiovascular: Normal rate, regular rhythm, normal heart sounds and intact distal pulses.  Exam reveals no gallop and no friction rub.   No murmur heard. Pulmonary/Chest: Effort normal. No accessory muscle usage or stridor. No tachypnea. No respiratory distress. He has decreased breath sounds in the right lower field. He has no  wheezes. He has no rhonchi. He has no rales.  Abdominal: Soft. Bowel sounds are normal. He exhibits no distension and no mass. There is no tenderness. There is no rebound and no guarding.  Musculoskeletal: Normal range of motion. He exhibits no edema, tenderness or deformity.  Lymphadenopathy:    He has no cervical adenopathy.  Neurological: He is oriented to person, place, and time.  Skin: Skin is warm and dry. No rash noted. He is not  diaphoretic. No erythema.  Vitals reviewed.   Lab Results  Component Value Date   WBC 8.7 11/03/2016   HGB 16.0 11/03/2016   HCT 46.7 11/03/2016   PLT 158 11/03/2016   GLUCOSE 97 11/03/2016   CHOL 162 03/21/2016   TRIG 117.0 03/21/2016   HDL 32.50 (L) 03/21/2016   LDLCALC 106 (H) 03/21/2016   ALT 11 08/03/2016   AST 13 08/03/2016   NA 140 11/03/2016   K 5.3 (H) 11/03/2016   CL 110 11/03/2016   CREATININE 1.33 (H) 11/03/2016   BUN 23 (H) 11/03/2016   CO2 24 11/03/2016   TSH 2.09 03/21/2016   PSA 0.04 (L) 03/21/2016   INR 1.09 02/27/2014    Dg Chest 2 View  Result Date: 11/03/2016 CLINICAL DATA:  Shortness of breath. EXAM: CHEST  2 VIEW COMPARISON:  03/21/2016. FINDINGS: Mediastinum and hilar structures are normal. Stable cardiomegaly. No focal alveolar infiltrate. No pleural effusion or pneumothorax. Pleural thickening on the right has improved. Old right rib fractures noted. Metallic densities noted over the right chest . IMPRESSION: 1.  Stable cardiomegaly.  No acute pulmonary disease. 2.  Old right rib fractures are again noted. Electronically Signed   By: Marcello Moores  Register   On: 11/03/2016 13:29    Assessment & Plan:   Cashius was seen today for hypertension.  Diagnoses and all orders for this visit:  Hyperparathyroidism [facility])- he is status post parathyroidectomy, I will monitor his calcium level to be certain that this is been adequately treated. -     Comprehensive metabolic panel; Future  Essential hypertension- considering his age his blood pressure is adequately well controlled, I will monitor his electrolytes and renal function. -     Comprehensive metabolic panel; Future  Other vitamin B12 deficiency anemia- I have encouraged him to continue parenteral B12 replacement therapy.  Need for pneumococcal vaccine -     Pneumococcal polysaccharide vaccine 23-valent greater than or equal to 2yo subcutaneous/IM   I have discontinued Mr. Ysaguirre's naproxen sodium and  HYDROcodone-acetaminophen. I am also having him maintain his aspirin, Multiple Vitamins-Minerals (CENTRUM SILVER PO), cyanocobalamin, amLODipine, tamsulosin, and valsartan.  No orders of the defined types were placed in this encounter.    Follow-up: Return in about 6 months (around 07/02/2017).  Scarlette Calico, MD

## 2017-01-02 NOTE — Progress Notes (Signed)
Pre visit review using our clinic review tool, if applicable. No additional management support is needed unless otherwise documented below in the visit note. 

## 2017-01-11 ENCOUNTER — Other Ambulatory Visit: Payer: Self-pay | Admitting: *Deleted

## 2017-01-11 MED ORDER — TAMSULOSIN HCL 0.4 MG PO CAPS: 0.4000 mg | ORAL_CAPSULE | Freq: Every day | ORAL | 3 refills | Status: DC

## 2017-01-11 NOTE — Telephone Encounter (Signed)
Rec'd call from pt wife stating husband saw MD last week and he forgot to get a refill on his Tamulosin. He was transferred to MD from Dr. Lenna Gilford and pharmacy needing new script. Inform pt will send electronically...Joseph Carter

## 2017-01-17 MED ORDER — CYANOCOBALAMIN 1000 MCG/ML IJ SOLN
1000.0000 ug | Freq: Once | INTRAMUSCULAR | Status: AC
  Administered 2016-12-26: 1000 ug via INTRAMUSCULAR

## 2017-01-25 DIAGNOSIS — H6121 Impacted cerumen, right ear: Secondary | ICD-10-CM | POA: Diagnosis not present

## 2017-01-26 ENCOUNTER — Ambulatory Visit (INDEPENDENT_AMBULATORY_CARE_PROVIDER_SITE_OTHER): Payer: Medicare Other | Admitting: General Practice

## 2017-01-26 ENCOUNTER — Ambulatory Visit: Payer: Medicare Other

## 2017-01-26 DIAGNOSIS — E538 Deficiency of other specified B group vitamins: Secondary | ICD-10-CM

## 2017-01-26 MED ORDER — CYANOCOBALAMIN 1000 MCG/ML IJ SOLN
1000.0000 ug | Freq: Once | INTRAMUSCULAR | Status: AC
  Administered 2017-01-26: 1000 ug via INTRAMUSCULAR

## 2017-02-27 ENCOUNTER — Other Ambulatory Visit: Payer: Self-pay | Admitting: Internal Medicine

## 2017-02-27 ENCOUNTER — Ambulatory Visit (INDEPENDENT_AMBULATORY_CARE_PROVIDER_SITE_OTHER): Payer: Medicare Other

## 2017-02-27 ENCOUNTER — Telehealth: Payer: Self-pay

## 2017-02-27 DIAGNOSIS — E538 Deficiency of other specified B group vitamins: Secondary | ICD-10-CM

## 2017-02-27 DIAGNOSIS — I10 Essential (primary) hypertension: Secondary | ICD-10-CM

## 2017-02-27 MED ORDER — CYANOCOBALAMIN 1000 MCG/ML IJ SOLN
1000.0000 ug | Freq: Once | INTRAMUSCULAR | Status: AC
  Administered 2017-02-27: 1000 ug via INTRAMUSCULAR

## 2017-02-27 MED ORDER — VALSARTAN 160 MG PO TABS: 160.0000 mg | ORAL_TABLET | Freq: Every day | ORAL | 2 refills | Status: DC

## 2017-02-27 NOTE — Telephone Encounter (Signed)
Patients current med, valsartan 160mg  tab was last prescribed by dr nadel---patient states dr Ronnald Ramp is now his pcp---patient is requested refill on valsartan--routing to dr Ronnald Ramp, are you ok with prescribing this med and sending refill---patient wants to use [facility] on [address] in [place]--please advise, thanks

## 2017-02-27 NOTE — Telephone Encounter (Signed)
Patient advised that rx has been sent to pharm 

## 2017-02-27 NOTE — Telephone Encounter (Signed)
RX sent

## 2017-03-21 ENCOUNTER — Ambulatory Visit: Payer: Medicare Other | Admitting: Pulmonary Disease

## 2017-03-29 ENCOUNTER — Ambulatory Visit (INDEPENDENT_AMBULATORY_CARE_PROVIDER_SITE_OTHER): Payer: Medicare Other

## 2017-03-29 ENCOUNTER — Telehealth: Payer: Self-pay

## 2017-03-29 DIAGNOSIS — E538 Deficiency of other specified B group vitamins: Secondary | ICD-10-CM

## 2017-03-29 MED ORDER — CYANOCOBALAMIN 1000 MCG/ML IJ SOLN
1000.0000 ug | Freq: Once | INTRAMUSCULAR | Status: AC
  Administered 2017-03-29: 1000 ug via INTRAMUSCULAR

## 2017-03-29 NOTE — Telephone Encounter (Signed)
error 

## 2017-03-29 NOTE — Telephone Encounter (Signed)
Per dr jones---b12 lab value is sufficient and patient needs to start scheduling b12 injections once every 3 months---patient will be advised today at nurse visit to start coming once every 3 months

## 2017-04-10 ENCOUNTER — Other Ambulatory Visit: Payer: Self-pay | Admitting: Internal Medicine

## 2017-06-27 ENCOUNTER — Telehealth: Payer: Self-pay | Admitting: Internal Medicine

## 2017-06-27 ENCOUNTER — Other Ambulatory Visit: Payer: Self-pay | Admitting: Internal Medicine

## 2017-06-27 DIAGNOSIS — I10 Essential (primary) hypertension: Secondary | ICD-10-CM

## 2017-06-27 MED ORDER — IRBESARTAN 300 MG PO TABS: 300.0000 mg | ORAL_TABLET | Freq: Every day | ORAL | 1 refills | Status: DC

## 2017-06-27 NOTE — Telephone Encounter (Signed)
Pts wife called stating that the pt is needing a refill on his valsartan (DIOVAN) 160 MG tablet. She knows that this has been recalled but did not know if he could wait to discuss this with Dr Ronnald Ramp when he is here for his appointment on Monday or if a change should be made now?

## 2017-06-27 NOTE — Telephone Encounter (Signed)
This has been changed 

## 2017-06-28 NOTE — Telephone Encounter (Signed)
Pt informed rx was sent.  

## 2017-07-03 ENCOUNTER — Ambulatory Visit: Payer: Medicare Other | Admitting: Internal Medicine

## 2017-07-06 ENCOUNTER — Encounter: Payer: Self-pay | Admitting: Internal Medicine

## 2017-07-06 ENCOUNTER — Other Ambulatory Visit (INDEPENDENT_AMBULATORY_CARE_PROVIDER_SITE_OTHER): Payer: Medicare Other

## 2017-07-06 ENCOUNTER — Ambulatory Visit (INDEPENDENT_AMBULATORY_CARE_PROVIDER_SITE_OTHER): Payer: Medicare Other | Admitting: Internal Medicine

## 2017-07-06 VITALS — BP 160/80 | HR 60 | Temp 98.1°F | Resp 16 | Ht 76.0 in | Wt 217.0 lb

## 2017-07-06 DIAGNOSIS — E7800 Pure hypercholesterolemia, unspecified: Secondary | ICD-10-CM | POA: Diagnosis not present

## 2017-07-06 DIAGNOSIS — C61 Malignant neoplasm of prostate: Secondary | ICD-10-CM

## 2017-07-06 DIAGNOSIS — E213 Hyperparathyroidism, unspecified: Secondary | ICD-10-CM

## 2017-07-06 DIAGNOSIS — I10 Essential (primary) hypertension: Secondary | ICD-10-CM

## 2017-07-06 DIAGNOSIS — E538 Deficiency of other specified B group vitamins: Secondary | ICD-10-CM

## 2017-07-06 LAB — LIPID PANEL
Cholesterol: 166 mg/dL (ref 0–200)
HDL: 33.2 mg/dL — ABNORMAL LOW (ref >39.00)
LDL Cholesterol: 103 mg/dL — ABNORMAL HIGH (ref 0–99)
NONHDL: 132.59
Total CHOL/HDL Ratio: 5
Triglycerides: 146 mg/dL (ref 0.0–149.0)
VLDL: 29.2 mg/dL (ref 0.0–40.0)

## 2017-07-06 LAB — CBC WITH DIFFERENTIAL/PLATELET
BASOS ABS: 0.1 10*3/uL (ref 0.0–0.1)
Basophils Relative: 0.8 % (ref 0.0–3.0)
Eosinophils Absolute: 0.2 10*3/uL (ref 0.0–0.7)
Eosinophils Relative: 2.6 % (ref 0.0–5.0)
HCT: 47.6 % (ref 39.0–52.0)
Hemoglobin: 16.3 g/dL (ref 13.0–17.0)
LYMPHS PCT: 29.6 % (ref 12.0–46.0)
Lymphs Abs: 2.4 10*3/uL (ref 0.7–4.0)
MCHC: 34.2 g/dL (ref 30.0–36.0)
MCV: 86.7 fl (ref 78.0–100.0)
MONOS PCT: 6 % (ref 3.0–12.0)
Monocytes Absolute: 0.5 10*3/uL (ref 0.1–1.0)
NEUTROS PCT: 61 % (ref 43.0–77.0)
Neutro Abs: 5 10*3/uL (ref 1.4–7.7)
Platelets: 154 10*3/uL (ref 150.0–400.0)
RBC: 5.49 Mil/uL (ref 4.22–5.81)
RDW: 14.1 % (ref 11.5–15.5)
WBC: 8.2 10*3/uL (ref 4.0–10.5)

## 2017-07-06 LAB — COMPREHENSIVE METABOLIC PANEL
ALK PHOS: 75 U/L (ref 39–117)
ALT: 10 U/L (ref 0–53)
AST: 14 U/L (ref 0–37)
Albumin: 4.5 g/dL (ref 3.5–5.2)
BILIRUBIN TOTAL: 1.6 mg/dL — AB (ref 0.2–1.2)
BUN: 28 mg/dL — AB (ref 6–23)
CO2: 26 mEq/L (ref 19–32)
CREATININE: 1.68 mg/dL — AB (ref 0.40–1.50)
Calcium: 9.5 mg/dL (ref 8.4–10.5)
Chloride: 108 mEq/L (ref 96–112)
GFR: 41.67 mL/min — AB (ref >60.00)
GLUCOSE: 111 mg/dL — AB (ref 70–99)
Potassium: 5.2 mEq/L — ABNORMAL HIGH (ref 3.5–5.1)
Sodium: 141 mEq/L (ref 135–145)
TOTAL PROTEIN: 7.2 g/dL (ref 6.0–8.3)

## 2017-07-06 LAB — PSA: PSA: 0.03 ng/mL — AB (ref 0.10–4.00)

## 2017-07-06 LAB — VITAMIN B12: Vitamin B-12: 225 pg/mL (ref 211–911)

## 2017-07-06 LAB — TSH: TSH: 2.75 u[IU]/mL (ref 0.35–4.50)

## 2017-07-06 MED ORDER — CYANOCOBALAMIN 2000 MCG PO TABS: 2000.0000 ug | ORAL_TABLET | Freq: Every day | ORAL | 3 refills | Status: DC

## 2017-07-06 MED ORDER — AMLODIPINE BESYLATE 5 MG PO TABS: 5.0000 mg | ORAL_TABLET | Freq: Every day | ORAL | 1 refills | Status: DC

## 2017-07-06 NOTE — Progress Notes (Signed)
Subjective:  Patient ID: Joseph Carter, male    DOB: 07-01-1932  Age: 83 y.o. MRN: [id number]  CC: Hypertension   HPI AVEER BARTOW presents for a BP check - He is concerned that his blood pressure has not been well controlled. He recently had to switch from valsartan and irbesartan. Also, somewhere along the way he ran out of amlodipine and has not been taking it. He feels well today and offers no complaints.  Outpatient Medications Prior to Visit  Medication Sig Dispense Refill  . aspirin 81 MG tablet Take 81 mg by mouth daily.     . irbesartan (AVAPRO) 300 MG tablet Take 1 tablet (300 mg total) by mouth daily. 90 tablet 1  . tamsulosin (FLOMAX) 0.4 MG CAPS capsule Take 1 capsule (0.4 mg total) by mouth at bedtime. 90 capsule 0  . amLODipine (NORVASC) 5 MG tablet take 1 tablet by mouth once daily 30 tablet 6  . Multiple Vitamins-Minerals (CENTRUM SILVER PO) Take 1 tablet by mouth daily.       No facility-administered medications prior to visit.     ROS Review of Systems  Constitutional: Negative for appetite change, chills, diaphoresis, fatigue and fever.  HENT: Negative.  Negative for trouble swallowing and voice change.   Eyes: Negative for visual disturbance.  Respiratory: Negative for cough, chest tightness, shortness of breath and wheezing.   Cardiovascular: Negative for chest pain, palpitations and leg swelling.  Gastrointestinal: Negative for abdominal pain, blood in stool, constipation, diarrhea, nausea and vomiting.  Endocrine: Negative.   Genitourinary: Negative.  Negative for decreased urine volume, difficulty urinating, dysuria, frequency, hematuria and urgency.  Musculoskeletal: Negative for arthralgias, joint swelling and myalgias.  Skin: Negative.  Negative for color change and rash.  Allergic/Immunologic: Negative.   Neurological: Negative.  Negative for dizziness and headaches.  Hematological: Negative for adenopathy. Does not bruise/bleed easily.    Psychiatric/Behavioral: Negative.     Objective:  BP (!) 160/80 (BP Location: Left Arm, Patient Position: Sitting, Cuff Size: Normal)   Pulse 60   Temp 98.1 F (36.7 C) (Oral)   Ht 6\' 4"  (1.93 m)   Wt 217 lb (98.4 kg)   SpO2 97%   BMI 26.41 kg/m   BP Readings from Last 3 Encounters:  07/06/17 (!) 160/80  01/02/17 (!) 150/50  11/08/16 (!) 143/83    Wt Readings from Last 3 Encounters:  07/06/17 217 lb (98.4 kg)  01/02/17 221 lb 8 oz (100.5 kg)  11/08/16 216 lb (98 kg)    Physical Exam  Constitutional: He is oriented to person, place, and time. No distress.  HENT:  Mouth/Throat: Oropharynx is clear and moist. No oropharyngeal exudate.  Eyes: Conjunctivae are normal. Right eye exhibits no discharge. Left eye exhibits no discharge. No scleral icterus.  Neck: Normal range of motion. Neck supple. No JVD present. No thyromegaly present.  Cardiovascular: Normal rate, regular rhythm and intact distal pulses.  Exam reveals no gallop and no friction rub.   No murmur heard. Pulmonary/Chest: Effort normal and breath sounds normal. No respiratory distress. He has no wheezes. He has no rales. He exhibits no tenderness.  Abdominal: Soft. Bowel sounds are normal. He exhibits no distension and no mass. There is no tenderness. There is no rebound and no guarding.  Musculoskeletal: Normal range of motion. He exhibits no edema, tenderness or deformity.  Lymphadenopathy:    He has no cervical adenopathy.  Neurological: He is alert and oriented to person, place, and time.  Skin:  Skin is warm and dry. No rash noted. He is not diaphoretic. No erythema. No pallor.  Vitals reviewed.   Lab Results  Component Value Date   WBC 8.2 07/06/2017   HGB 16.3 07/06/2017   HCT 47.6 07/06/2017   PLT 154.0 07/06/2017   GLUCOSE 111 (H) 07/06/2017   CHOL 166 07/06/2017   TRIG 146.0 07/06/2017   HDL 33.20 (L) 07/06/2017   LDLCALC 103 (H) 07/06/2017   ALT 10 07/06/2017   AST 14 07/06/2017   NA 141  07/06/2017   K 5.2 (H) 07/06/2017   CL 108 07/06/2017   CREATININE 1.68 (H) 07/06/2017   BUN 28 (H) 07/06/2017   CO2 26 07/06/2017   TSH 2.75 07/06/2017   PSA 0.03 (L) 07/06/2017   INR 1.09 02/27/2014    Dg Chest 2 View  Result Date: 11/03/2016 CLINICAL DATA:  Shortness of breath. EXAM: CHEST  2 VIEW COMPARISON:  03/21/2016. FINDINGS: Mediastinum and hilar structures are normal. Stable cardiomegaly. No focal alveolar infiltrate. No pleural effusion or pneumothorax. Pleural thickening on the right has improved. Old right rib fractures noted. Metallic densities noted over the right chest . IMPRESSION: 1.  Stable cardiomegaly.  No acute pulmonary disease. 2.  Old right rib fractures are again noted. Electronically Signed   By: Marcello Moores  Register   On: 11/03/2016 13:29    Assessment & Plan:   Giavonni was seen today for hypertension.  Diagnoses and all orders for this visit:  Essential hypertension- his blood pressure is not adequately well controlled. Will restart the ARB. He has a mild but insignificantly elevated potassium level and stable renal function. -     Comprehensive metabolic panel; Future -     amLODipine (NORVASC) 5 MG tablet; Take 1 tablet (5 mg total) by mouth daily.  Hyperparathyroidism ([place])- his calcium level is in the normal range. Will continue to monitor for complications related to this but at this time this appears to have resolved. -     Comprehensive metabolic panel; Future  Pure hypercholesterolemia- He has achieved his LDL goal is not willing to take a statin. -     Lipid panel; Future -     TSH; Future  Prostate cancer ([place])- PSA remains very low, there is no evidence of recurrence of disease -     PSA; Future  B12 deficiency- his B12 level is low again. I've asked him to start high-dose oral B12 supplementation. -     CBC with Differential/Platelet; Future -     Vitamin B12; Future -     Methylmalonic acid, serum; Future -     cyanocobalamin 2000 MCG  tablet; Take 1 tablet (2,000 mcg total) by mouth daily.   I have discontinued Mr. Atwood's Multiple Vitamins-Minerals (CENTRUM SILVER PO). I have also changed his amLODipine. Additionally, I am having him start on cyanocobalamin. Lastly, I am having him maintain his aspirin, tamsulosin, and irbesartan.  Meds ordered this encounter  Medications  . amLODipine (NORVASC) 5 MG tablet    Sig: Take 1 tablet (5 mg total) by mouth daily.    Dispense:  90 tablet    Refill:  1  . cyanocobalamin 2000 MCG tablet    Sig: Take 1 tablet (2,000 mcg total) by mouth daily.    Dispense:  90 tablet    Refill:  3     Follow-up: Return in about 4 months (around 11/05/2017).  Scarlette Calico, MD

## 2017-07-06 NOTE — Patient Instructions (Signed)

## 2017-07-10 LAB — METHYLMALONIC ACID, SERUM: METHYLMALONIC ACID, QUANT: 413 nmol/L — AB (ref 87–318)

## 2017-08-08 ENCOUNTER — Other Ambulatory Visit: Payer: Self-pay | Admitting: Internal Medicine

## 2017-09-20 ENCOUNTER — Ambulatory Visit (INDEPENDENT_AMBULATORY_CARE_PROVIDER_SITE_OTHER): Payer: Medicare Other

## 2017-09-20 DIAGNOSIS — Z23 Encounter for immunization: Secondary | ICD-10-CM

## 2017-09-20 DIAGNOSIS — E538 Deficiency of other specified B group vitamins: Secondary | ICD-10-CM | POA: Diagnosis not present

## 2017-09-20 MED ORDER — CYANOCOBALAMIN 1000 MCG/ML IJ SOLN
1000.0000 ug | Freq: Once | INTRAMUSCULAR | Status: AC
  Administered 2017-09-20: 1000 ug via INTRAMUSCULAR

## 2017-12-19 ENCOUNTER — Ambulatory Visit (INDEPENDENT_AMBULATORY_CARE_PROVIDER_SITE_OTHER): Payer: Medicare Other | Admitting: *Deleted

## 2017-12-19 DIAGNOSIS — E538 Deficiency of other specified B group vitamins: Secondary | ICD-10-CM | POA: Diagnosis not present

## 2017-12-19 MED ORDER — CYANOCOBALAMIN 1000 MCG/ML IJ SOLN
1000.0000 ug | Freq: Once | INTRAMUSCULAR | Status: AC
  Administered 2017-12-19: 1000 ug via INTRAMUSCULAR

## 2018-01-15 ENCOUNTER — Other Ambulatory Visit: Payer: Self-pay | Admitting: Internal Medicine

## 2018-01-15 DIAGNOSIS — I10 Essential (primary) hypertension: Secondary | ICD-10-CM

## 2018-02-12 ENCOUNTER — Other Ambulatory Visit: Payer: Self-pay | Admitting: Internal Medicine

## 2018-02-12 DIAGNOSIS — I10 Essential (primary) hypertension: Secondary | ICD-10-CM

## 2018-03-21 ENCOUNTER — Ambulatory Visit (INDEPENDENT_AMBULATORY_CARE_PROVIDER_SITE_OTHER): Payer: Medicare Other | Admitting: Emergency Medicine

## 2018-03-21 DIAGNOSIS — E538 Deficiency of other specified B group vitamins: Secondary | ICD-10-CM | POA: Diagnosis not present

## 2018-03-21 MED ORDER — CYANOCOBALAMIN 1000 MCG/ML IJ SOLN
1000.0000 ug | Freq: Once | INTRAMUSCULAR | Status: AC
  Administered 2018-03-21: 1000 ug via INTRAMUSCULAR

## 2018-03-21 NOTE — Progress Notes (Signed)
I have reviewed and agree.

## 2018-04-25 ENCOUNTER — Other Ambulatory Visit: Payer: Self-pay | Admitting: Internal Medicine

## 2018-04-25 DIAGNOSIS — I10 Essential (primary) hypertension: Secondary | ICD-10-CM

## 2018-05-15 ENCOUNTER — Telehealth: Payer: Self-pay | Admitting: Emergency Medicine

## 2018-05-15 NOTE — Telephone Encounter (Signed)
Called patient to schedule AWV. Pt declined at this time. 

## 2018-07-11 ENCOUNTER — Encounter: Payer: Medicare Other | Admitting: Internal Medicine

## 2018-07-24 ENCOUNTER — Encounter: Payer: Self-pay | Admitting: Internal Medicine

## 2018-07-24 ENCOUNTER — Other Ambulatory Visit (INDEPENDENT_AMBULATORY_CARE_PROVIDER_SITE_OTHER): Payer: Medicare Other

## 2018-07-24 ENCOUNTER — Ambulatory Visit (INDEPENDENT_AMBULATORY_CARE_PROVIDER_SITE_OTHER): Payer: Medicare Other | Admitting: Internal Medicine

## 2018-07-24 VITALS — BP 130/84 | HR 64 | Temp 97.8°F | Resp 16 | Ht 76.0 in | Wt 217.2 lb

## 2018-07-24 DIAGNOSIS — Z0000 Encounter for general adult medical examination without abnormal findings: Secondary | ICD-10-CM | POA: Diagnosis not present

## 2018-07-24 DIAGNOSIS — E7800 Pure hypercholesterolemia, unspecified: Secondary | ICD-10-CM

## 2018-07-24 DIAGNOSIS — E213 Hyperparathyroidism, unspecified: Secondary | ICD-10-CM

## 2018-07-24 DIAGNOSIS — E538 Deficiency of other specified B group vitamins: Secondary | ICD-10-CM

## 2018-07-24 DIAGNOSIS — I10 Essential (primary) hypertension: Secondary | ICD-10-CM

## 2018-07-24 DIAGNOSIS — Z23 Encounter for immunization: Secondary | ICD-10-CM

## 2018-07-24 DIAGNOSIS — C61 Malignant neoplasm of prostate: Secondary | ICD-10-CM

## 2018-07-24 LAB — FOLATE

## 2018-07-24 LAB — LIPID PANEL
Cholesterol: 150 mg/dL (ref 0–200)
HDL: 34.3 mg/dL — AB (ref >39.00)
LDL Cholesterol: 95 mg/dL (ref 0–99)
NonHDL: 115.41
TRIGLYCERIDES: 103 mg/dL (ref 0.0–149.0)
Total CHOL/HDL Ratio: 4
VLDL: 20.6 mg/dL (ref 0.0–40.0)

## 2018-07-24 LAB — COMPREHENSIVE METABOLIC PANEL
ALT: 9 U/L (ref 0–53)
AST: 13 U/L (ref 0–37)
Albumin: 4.1 g/dL (ref 3.5–5.2)
Alkaline Phosphatase: 71 U/L (ref 39–117)
BUN: 23 mg/dL (ref 6–23)
CALCIUM: 9.3 mg/dL (ref 8.4–10.5)
CHLORIDE: 106 meq/L (ref 96–112)
CO2: 26 mEq/L (ref 19–32)
Creatinine, Ser: 1.41 mg/dL (ref 0.40–1.50)
GFR: 50.87 mL/min — AB (ref >60.00)
Glucose, Bld: 113 mg/dL — ABNORMAL HIGH (ref 70–99)
Potassium: 4.6 mEq/L (ref 3.5–5.1)
Sodium: 138 mEq/L (ref 135–145)
Total Bilirubin: 1.9 mg/dL — ABNORMAL HIGH (ref 0.2–1.2)
Total Protein: 7 g/dL (ref 6.0–8.3)

## 2018-07-24 LAB — PSA: PSA: 0.02 ng/mL — ABNORMAL LOW (ref 0.10–4.00)

## 2018-07-24 LAB — URINALYSIS, ROUTINE W REFLEX MICROSCOPIC
Bilirubin Urine: NEGATIVE
HGB URINE DIPSTICK: NEGATIVE
Ketones, ur: NEGATIVE
LEUKOCYTES UA: NEGATIVE
Nitrite: NEGATIVE
RBC / HPF: NONE SEEN (ref 0–?)
Specific Gravity, Urine: 1.02 (ref 1.000–1.030)
TOTAL PROTEIN, URINE-UPE24: NEGATIVE
Urine Glucose: NEGATIVE
Urobilinogen, UA: 0.2 (ref 0.0–1.0)
pH: 6 (ref 5.0–8.0)

## 2018-07-24 LAB — VITAMIN D 25 HYDROXY (VIT D DEFICIENCY, FRACTURES): VITD: 52.81 ng/mL (ref 30.00–100.00)

## 2018-07-24 LAB — CBC WITH DIFFERENTIAL/PLATELET
BASOS PCT: 0.6 % (ref 0.0–3.0)
Basophils Absolute: 0 10*3/uL (ref 0.0–0.1)
Eosinophils Absolute: 0.2 10*3/uL (ref 0.0–0.7)
Eosinophils Relative: 2.6 % (ref 0.0–5.0)
HEMATOCRIT: 45.9 % (ref 39.0–52.0)
Hemoglobin: 15.9 g/dL (ref 13.0–17.0)
LYMPHS PCT: 26.6 % (ref 12.0–46.0)
Lymphs Abs: 2.2 10*3/uL (ref 0.7–4.0)
MCHC: 34.7 g/dL (ref 30.0–36.0)
MCV: 83.7 fl (ref 78.0–100.0)
MONOS PCT: 6.3 % (ref 3.0–12.0)
Monocytes Absolute: 0.5 10*3/uL (ref 0.1–1.0)
NEUTROS ABS: 5.3 10*3/uL (ref 1.4–7.7)
Neutrophils Relative %: 63.9 % (ref 43.0–77.0)
PLATELETS: 160 10*3/uL (ref 150.0–400.0)
RBC: 5.48 Mil/uL (ref 4.22–5.81)
RDW: 13.8 % (ref 11.5–15.5)
WBC: 8.2 10*3/uL (ref 4.0–10.5)

## 2018-07-24 LAB — TSH: TSH: 2.21 u[IU]/mL (ref 0.35–4.50)

## 2018-07-24 LAB — VITAMIN B12: Vitamin B-12: 216 pg/mL (ref 211–911)

## 2018-07-24 MED ORDER — CYANOCOBALAMIN 2000 MCG PO TABS: 2000.0000 ug | ORAL_TABLET | Freq: Every day | ORAL | 1 refills | Status: DC

## 2018-07-24 NOTE — Patient Instructions (Signed)

## 2018-07-24 NOTE — Progress Notes (Signed)
Subjective:  Patient ID: Joseph Carter, male    DOB: 12-26-1931  Age: 84 y.o. MRN: [id number]  CC: Annual Exam; Hypertension; and Anemia   HPI [name] presents for a CPX.  He is very active and denies any recent episodes of CP, DOE, palpitations, edema, or fatigue.  He has a history of B12 deficiency but has not been taking his B12 supplement recently.  He denies paresthesias.  Past Medical History:  Diagnosis Date  . Adenocarcinoma of prostate ([place])   . BPH (benign prostatic hyperplasia)   . Cervical spondylosis without myelopathy   . COPD (chronic obstructive pulmonary disease) ([place])   . Diverticular disease   . DJD (degenerative joint disease)   . Exposure to asbestos   . History of kidney stones   . Hypercholesteremia   . Hypertension   . Hypogonadism male   . Lumbar back pain   . PAC (premature atrial contraction)   . Pernicious anemia    Past Surgical History:  Procedure Laterality Date  . APPENDECTOMY    . CTS surgery on right     Dr. Amedeo Plenty. Carpal tunnel tunnel  . LUMBAR LAMINECTOMY  03/07/2014   L3 L4   DR Ronnald Ramp  . lumbar laminectomy L4-5  2001   Dr. Luiz Ochoa  . LUNG SURGERY     /w Dr. Arlyce Dice  . MAXIMUM ACCESS (MAS)POSTERIOR LUMBAR INTERBODY FUSION (PLIF) 1 LEVEL N/A 03/07/2014   Procedure: FOR MAXIMUM ACCESS (MAS) POSTERIOR LUMBAR INTERBODY FUSION (PLIF) 1 LEVEL;  Surgeon: [name], MD;  Location: [facility];  Service: Neurosurgery;  Laterality: N/A;  FOR MAXIMUM ACCESS (MAS) POSTERIOR LUMBAR INTERBODY FUSION (PLIF) 1 LEVEL  . PARATHYROIDECTOMY Left 11/08/2016   Procedure: LEFT INFERIOR MINIMALLY INVASIVE PARATHYROIDECTOMY;  Surgeon: Armandina Gemma, MD;  Location: [facility];  Service: General;  Laterality: Left;  . prostate biopsy, then brachytherapy  04/2010   Dr. Rosana Hoes  . PROSTATE SURGERY    . surgery for removal of intercostal cyst  1997    reports that he quit smoking about 48 years ago. His smoking use included cigars. He quit after 30.00 years of  use. He quit smokeless tobacco use about 48 years ago.  His smokeless tobacco use included chew. He reports that he drinks about 5.0 standard drinks of alcohol per week. He reports that he does not use drugs. family history includes Prostate cancer in his father. Allergies  Allergen Reactions  . Lisinopril     REACTION: Hx of ACE cough...    Outpatient Medications Prior to Visit  Medication Sig Dispense Refill  . amLODipine (NORVASC) 5 MG tablet TAKE 1 TABLET ONCE DAILY 90 tablet 0  . aspirin 81 MG tablet Take 81 mg by mouth daily.     . irbesartan (AVAPRO) 300 MG tablet TAKE 1 TABLET BY MOUTH EVERY DAY 90 tablet 0  . tamsulosin (FLOMAX) 0.4 MG CAPS capsule TAKE ONE CAPSULE BY MOUTH AT BEDTIME 90 capsule 0  . cyanocobalamin 2000 MCG tablet Take 1 tablet (2,000 mcg total) by mouth daily. 90 tablet 3   No facility-administered medications prior to visit.     ROS Review of Systems  Constitutional: Negative for diaphoresis, fatigue and unexpected weight change.  HENT: Negative.   Eyes: Negative for visual disturbance.  Respiratory: Negative for apnea, cough, chest tightness, shortness of breath and wheezing.   Cardiovascular: Negative for chest pain, palpitations and leg swelling.  Gastrointestinal: Negative for abdominal pain, constipation, diarrhea, nausea and vomiting.  Endocrine:  Negative.   Genitourinary: Negative.  Negative for difficulty urinating, frequency, hematuria, penile swelling, scrotal swelling, testicular pain and urgency.  Musculoskeletal: Negative.  Negative for arthralgias, myalgias and neck pain.  Skin: Negative for color change and pallor.  Neurological: Negative.  Negative for dizziness, weakness, light-headedness and headaches.  Hematological: Negative for adenopathy. Does not bruise/bleed easily.  Psychiatric/Behavioral: Negative.     Objective:  BP 130/84 (BP Location: Left Arm, Patient Position: Sitting, Cuff Size: Normal)   Pulse 64   Temp 97.8 F (36.6  C) (Oral)   Resp 16   Ht 6\' 4"  (1.93 m)   Wt 217 lb 4 oz (98.5 kg)   SpO2 97%   BMI 26.44 kg/m   BP Readings from Last 3 Encounters:  07/24/18 130/84  07/06/17 (!) 160/80  01/02/17 (!) 150/50    Wt Readings from Last 3 Encounters:  07/24/18 217 lb 4 oz (98.5 kg)  07/06/17 217 lb (98.4 kg)  01/02/17 221 lb 8 oz (100.5 kg)    Physical Exam  Constitutional: He is oriented to person, place, and time. No distress.  HENT:  Mouth/Throat: Oropharynx is clear and moist. No oropharyngeal exudate.  Eyes: Conjunctivae are normal. No scleral icterus.  Neck: Normal range of motion. Neck supple. No JVD present. No thyromegaly present.  Cardiovascular: Normal rate, regular rhythm and normal heart sounds. Exam reveals no gallop.  No murmur heard. Pulmonary/Chest: Effort normal and breath sounds normal. No respiratory distress. He has no wheezes. He has no rales.  Abdominal: Soft. Normal appearance and bowel sounds are normal. He exhibits no mass. There is no hepatosplenomegaly. There is no tenderness. No hernia.  Genitourinary:  Genitourinary Comments: GU and rectal exams were deferred at his request.  Musculoskeletal: Normal range of motion. He exhibits no edema, tenderness or deformity.  Lymphadenopathy:    He has no cervical adenopathy.  Neurological: He is alert and oriented to person, place, and time.  Skin: Skin is warm and dry. No rash noted. He is not diaphoretic.  Psychiatric: He has a normal mood and affect. His behavior is normal. Judgment and thought content normal.  Vitals reviewed.   Lab Results  Component Value Date   WBC 8.2 07/24/2018   HGB 15.9 07/24/2018   HCT 45.9 07/24/2018   PLT 160.0 07/24/2018   GLUCOSE 113 (H) 07/24/2018   CHOL 150 07/24/2018   TRIG 103.0 07/24/2018   HDL 34.30 (L) 07/24/2018   LDLCALC 95 07/24/2018   ALT 9 07/24/2018   AST 13 07/24/2018   NA 138 07/24/2018   K 4.6 07/24/2018   CL 106 07/24/2018   CREATININE 1.41 07/24/2018   BUN 23  07/24/2018   CO2 26 07/24/2018   TSH 2.21 07/24/2018   PSA 0.02 (L) 07/24/2018   INR 1.09 02/27/2014    Dg Chest 2 View  Result Date: 11/03/2016 CLINICAL DATA:  Shortness of breath. EXAM: CHEST  2 VIEW COMPARISON:  03/21/2016. FINDINGS: Mediastinum and hilar structures are normal. Stable cardiomegaly. No focal alveolar infiltrate. No pleural effusion or pneumothorax. Pleural thickening on the right has improved. Old right rib fractures noted. Metallic densities noted over the right chest . IMPRESSION: 1.  Stable cardiomegaly.  No acute pulmonary disease. 2.  Old right rib fractures are again noted. Electronically Signed   By: Marcello Moores  Register   On: 11/03/2016 13:29    Assessment & Plan:   Rishik was seen today for annual exam, hypertension and anemia.  Diagnoses and all orders for this visit:  Hyperparathyroidism ([place])- His calcium and vitamin D levels are normal.  I will monitor his PTH level.  He is asymptomatic with respect to this. -     Comprehensive metabolic panel; Future -     PTH, intact and calcium; Future -     VITAMIN D 25 Hydroxy (Vit-D Deficiency, Fractures); Future  Essential hypertension- His blood pressure is well controlled.  Electrolytes and renal function are normal. -     Urinalysis, Routine w reflex microscopic; Future -     Comprehensive metabolic panel; Future -     VITAMIN D 25 Hydroxy (Vit-D Deficiency, Fractures); Future  B12 deficiency - His B12 level is borderline low at 216.  I have asked him to restart the oral high-dose B12 supplement. -     CBC with Differential/Platelet; Future -     Vitamin B12; Future -     Folate; Future -     cyanocobalamin 2000 MCG tablet; Take 1 tablet (2,000 mcg total) by mouth daily.  Pure hypercholesterolemia- Statin therapy is not indicated. -     Lipid panel; Future -     TSH; Future  Need for influenza vaccination -     Flu Vaccine QUAD 36+ mos IM  Prostate cancer ([place])- His PSA remains very low which is reassuring  that he does not have a recurrence of prostate cancer. -     PSA; Future   I am having Suhail N. Edgington maintain his aspirin, tamsulosin, irbesartan, amLODipine, and cyanocobalamin.  Meds ordered this encounter  Medications  . cyanocobalamin 2000 MCG tablet    Sig: Take 1 tablet (2,000 mcg total) by mouth daily.    Dispense:  90 tablet    Refill:  1   See AVS for instructions about healthy living and anticipatory guidance.  Follow-up: Return in about 6 months (around 01/24/2019).  Scarlette Calico, MD

## 2018-07-25 LAB — PTH, INTACT AND CALCIUM
Calcium: 9.1 mg/dL (ref 8.6–10.3)
PTH: 44 pg/mL (ref 14–64)

## 2018-07-25 NOTE — Assessment & Plan Note (Signed)

## 2018-08-02 ENCOUNTER — Other Ambulatory Visit: Payer: Self-pay | Admitting: Internal Medicine

## 2018-08-02 DIAGNOSIS — I10 Essential (primary) hypertension: Secondary | ICD-10-CM

## 2018-09-11 ENCOUNTER — Other Ambulatory Visit: Payer: Self-pay | Admitting: Internal Medicine

## 2018-09-24 ENCOUNTER — Telehealth: Payer: Self-pay

## 2018-09-24 ENCOUNTER — Other Ambulatory Visit: Payer: Self-pay | Admitting: Internal Medicine

## 2018-09-24 ENCOUNTER — Ambulatory Visit (INDEPENDENT_AMBULATORY_CARE_PROVIDER_SITE_OTHER): Payer: Medicare Other

## 2018-09-24 DIAGNOSIS — E538 Deficiency of other specified B group vitamins: Secondary | ICD-10-CM

## 2018-09-24 MED ORDER — CYANOCOBALAMIN 1000 MCG/ML IJ SOLN
1000.0000 ug | Freq: Once | INTRAMUSCULAR | Status: AC
  Administered 2018-09-24: 1000 ug via INTRAMUSCULAR

## 2018-09-24 NOTE — Progress Notes (Signed)
I have reviewed and agree.

## 2018-09-24 NOTE — Telephone Encounter (Signed)
I recommend a B12 inj monthly  TJ

## 2018-09-24 NOTE — Telephone Encounter (Signed)
Patient had lab work in aug/2019---b12 results are low, can you please give recommendations for b12 injections, patient had first b12 injection today---I will call patient back to advise, thanks

## 2018-10-10 DIAGNOSIS — H9193 Unspecified hearing loss, bilateral: Secondary | ICD-10-CM | POA: Diagnosis not present

## 2018-10-28 ENCOUNTER — Other Ambulatory Visit: Payer: Self-pay | Admitting: Internal Medicine

## 2018-10-28 DIAGNOSIS — I10 Essential (primary) hypertension: Secondary | ICD-10-CM

## 2018-11-07 ENCOUNTER — Ambulatory Visit (INDEPENDENT_AMBULATORY_CARE_PROVIDER_SITE_OTHER): Payer: Medicare Other

## 2018-11-07 DIAGNOSIS — E538 Deficiency of other specified B group vitamins: Secondary | ICD-10-CM

## 2018-11-07 MED ORDER — CYANOCOBALAMIN 1000 MCG/ML IJ SOLN
1000.0000 ug | Freq: Once | INTRAMUSCULAR | Status: AC
  Administered 2018-11-07: 1000 ug via INTRAMUSCULAR

## 2018-11-07 NOTE — Progress Notes (Signed)
I have reviewed and agree.

## 2018-12-11 ENCOUNTER — Ambulatory Visit (INDEPENDENT_AMBULATORY_CARE_PROVIDER_SITE_OTHER): Payer: Medicare Other | Admitting: *Deleted

## 2018-12-11 DIAGNOSIS — E538 Deficiency of other specified B group vitamins: Secondary | ICD-10-CM | POA: Diagnosis not present

## 2018-12-11 MED ORDER — CYANOCOBALAMIN 1000 MCG/ML IJ SOLN
1000.0000 ug | Freq: Once | INTRAMUSCULAR | Status: AC
  Administered 2018-12-11: 1000 ug via INTRAMUSCULAR

## 2018-12-11 NOTE — Progress Notes (Signed)
I have reviewed and agree.

## 2019-01-16 ENCOUNTER — Ambulatory Visit (INDEPENDENT_AMBULATORY_CARE_PROVIDER_SITE_OTHER): Payer: Medicare Other

## 2019-01-16 DIAGNOSIS — E538 Deficiency of other specified B group vitamins: Secondary | ICD-10-CM

## 2019-01-16 MED ORDER — CYANOCOBALAMIN 1000 MCG/ML IJ SOLN
1000.0000 ug | Freq: Once | INTRAMUSCULAR | Status: AC
  Administered 2019-01-16: 1000 ug via INTRAMUSCULAR

## 2019-01-16 NOTE — Progress Notes (Signed)
I have reviewed and agree.

## 2019-01-26 ENCOUNTER — Other Ambulatory Visit: Payer: Self-pay | Admitting: Internal Medicine

## 2019-02-18 ENCOUNTER — Ambulatory Visit (INDEPENDENT_AMBULATORY_CARE_PROVIDER_SITE_OTHER): Payer: Medicare Other | Admitting: *Deleted

## 2019-02-18 ENCOUNTER — Other Ambulatory Visit: Payer: Self-pay

## 2019-02-18 DIAGNOSIS — E538 Deficiency of other specified B group vitamins: Secondary | ICD-10-CM

## 2019-02-18 MED ORDER — CYANOCOBALAMIN 1000 MCG/ML IJ SOLN
1000.0000 ug | Freq: Once | INTRAMUSCULAR | Status: AC
  Administered 2019-02-18: 1000 ug via INTRAMUSCULAR

## 2019-02-18 NOTE — Progress Notes (Signed)
I have reviewed and agree.

## 2019-03-27 ENCOUNTER — Ambulatory Visit: Payer: Medicare Other

## 2019-03-27 ENCOUNTER — Other Ambulatory Visit (INDEPENDENT_AMBULATORY_CARE_PROVIDER_SITE_OTHER): Payer: Medicare Other

## 2019-03-27 ENCOUNTER — Ambulatory Visit (INDEPENDENT_AMBULATORY_CARE_PROVIDER_SITE_OTHER): Payer: Medicare Other | Admitting: Internal Medicine

## 2019-03-27 ENCOUNTER — Other Ambulatory Visit: Payer: Self-pay

## 2019-03-27 ENCOUNTER — Encounter: Payer: Self-pay | Admitting: Internal Medicine

## 2019-03-27 VITALS — BP 144/82 | HR 66 | Temp 97.6°F | Ht 76.0 in | Wt 213.5 lb

## 2019-03-27 DIAGNOSIS — E538 Deficiency of other specified B group vitamins: Secondary | ICD-10-CM | POA: Diagnosis not present

## 2019-03-27 DIAGNOSIS — I10 Essential (primary) hypertension: Secondary | ICD-10-CM

## 2019-03-27 DIAGNOSIS — N183 Chronic kidney disease, stage 3 unspecified: Secondary | ICD-10-CM

## 2019-03-27 DIAGNOSIS — E213 Hyperparathyroidism, unspecified: Secondary | ICD-10-CM

## 2019-03-27 LAB — CBC WITH DIFFERENTIAL/PLATELET
Basophils Absolute: 0.1 10*3/uL (ref 0.0–0.1)
Basophils Relative: 0.8 % (ref 0.0–3.0)
Eosinophils Absolute: 0.3 10*3/uL (ref 0.0–0.7)
Eosinophils Relative: 3.3 % (ref 0.0–5.0)
HCT: 45.3 % (ref 39.0–52.0)
Hemoglobin: 15.5 g/dL (ref 13.0–17.0)
Lymphocytes Relative: 25.6 % (ref 12.0–46.0)
Lymphs Abs: 2.2 10*3/uL (ref 0.7–4.0)
MCHC: 34.3 g/dL (ref 30.0–36.0)
MCV: 83.2 fl (ref 78.0–100.0)
Monocytes Absolute: 0.5 10*3/uL (ref 0.1–1.0)
Monocytes Relative: 5.8 % (ref 3.0–12.0)
Neutro Abs: 5.6 10*3/uL (ref 1.4–7.7)
Neutrophils Relative %: 64.5 % (ref 43.0–77.0)
Platelets: 186 10*3/uL (ref 150.0–400.0)
RBC: 5.44 Mil/uL (ref 4.22–5.81)
RDW: 14.3 % (ref 11.5–15.5)
WBC: 8.8 10*3/uL (ref 4.0–10.5)

## 2019-03-27 LAB — BASIC METABOLIC PANEL
BUN: 18 mg/dL (ref 6–23)
CO2: 24 mEq/L (ref 19–32)
Calcium: 8.8 mg/dL (ref 8.4–10.5)
Chloride: 107 mEq/L (ref 96–112)
Creatinine, Ser: 1.3 mg/dL (ref 0.40–1.50)
GFR: 52.48 mL/min — ABNORMAL LOW (ref >60.00)
Glucose, Bld: 112 mg/dL — ABNORMAL HIGH (ref 70–99)
Potassium: 4.3 mEq/L (ref 3.5–5.1)
Sodium: 139 mEq/L (ref 135–145)

## 2019-03-27 LAB — FOLATE: Folate: >23.3 ng/mL (ref >5.9)

## 2019-03-27 MED ORDER — CYANOCOBALAMIN 1000 MCG/ML IJ SOLN
1000.0000 ug | Freq: Once | INTRAMUSCULAR | Status: AC
  Administered 2019-03-27: 1000 ug via INTRAMUSCULAR

## 2019-03-27 NOTE — Patient Instructions (Signed)

## 2019-03-27 NOTE — Progress Notes (Signed)
Subjective:  Patient ID: Joseph Carter, male    DOB: [birth year]  Age: 85 y.o. MRN: [id number]  CC: Hypertension and Anemia   HPI Joseph Carter presents for f/up - He has felt well recently and offers no complaints.  He works in his yard and denies any recent episodes of CP, DOE, palpitations, edema, fatigue, dizziness, or lightheadedness.  Outpatient Medications Prior to Visit  Medication Sig Dispense Refill  . amLODipine (NORVASC) 5 MG tablet TAKE 1 TABLET ONCE DAILY 90 tablet 1  . cyanocobalamin 2000 MCG tablet Take 1 tablet (2,000 mcg total) by mouth daily. 90 tablet 1  . irbesartan (AVAPRO) 300 MG tablet TAKE 1 TABLET BY MOUTH EVERY DAY 90 tablet 1  . tamsulosin (FLOMAX) 0.4 MG CAPS capsule TAKE ONE CAPSULE BY MOUTH AT BEDTIME 90 capsule 1   No facility-administered medications prior to visit.     ROS Review of Systems  Constitutional: Negative for diaphoresis and fatigue.  HENT: Negative.   Eyes: Negative for visual disturbance.  Respiratory: Negative for cough, chest tightness, shortness of breath and wheezing.   Cardiovascular: Negative for chest pain, palpitations and leg swelling.  Gastrointestinal: Negative for abdominal pain, diarrhea, nausea and vomiting.  Genitourinary: Negative.  Negative for difficulty urinating.  Musculoskeletal: Negative.  Negative for arthralgias and myalgias.  Skin: Negative.  Negative for color change, pallor and rash.  Neurological: Negative.  Negative for dizziness, weakness and light-headedness.  Hematological: Negative for adenopathy. Does not bruise/bleed easily.  Psychiatric/Behavioral: Negative.     Objective:  BP (!) 144/82 (BP Location: Left Arm, Patient Position: Sitting, Cuff Size: Normal)   Pulse 66   Temp 97.6 F (36.4 C) (Oral)   Ht 6\' 4"  (1.93 m)   Wt 213 lb 8 oz (96.8 kg)   SpO2 97%   BMI 25.99 kg/m   BP Readings from Last 3 Encounters:  03/27/19 (!) 144/82  07/24/18 130/84  07/06/17 (!) 160/80    Wt Readings  from Last 3 Encounters:  03/27/19 213 lb 8 oz (96.8 kg)  07/24/18 217 lb 4 oz (98.5 kg)  07/06/17 217 lb (98.4 kg)    Physical Exam Vitals signs reviewed.  Constitutional:      Appearance: He is not ill-appearing or diaphoretic.  HENT:     Nose: Nose normal. No congestion.     Mouth/Throat:     Mouth: Mucous membranes are moist.     Pharynx: No oropharyngeal exudate.  Eyes:     General: No scleral icterus.    Conjunctiva/sclera: Conjunctivae normal.  Neck:     Musculoskeletal: Normal range of motion. No neck rigidity or muscular tenderness.  Cardiovascular:     Rate and Rhythm: Normal rate and regular rhythm.     Heart sounds: No murmur. No gallop.   Pulmonary:     Effort: Pulmonary effort is normal. No respiratory distress.     Breath sounds: No stridor. No wheezing, rhonchi or rales.  Abdominal:     General: Abdomen is flat. Bowel sounds are normal.     Palpations: There is no hepatomegaly, splenomegaly or mass.     Tenderness: There is no abdominal tenderness. There is no guarding.  Musculoskeletal: Normal range of motion.        General: No swelling.     Right lower leg: No edema.     Left lower leg: No edema.  Lymphadenopathy:     Cervical: No cervical adenopathy.  Skin:    General: Skin is warm.  Coloration: Skin is not pale.  Neurological:     General: No focal deficit present.  Psychiatric:        Mood and Affect: Mood normal.        Behavior: Behavior normal.     Lab Results  Component Value Date   WBC 8.8 03/27/2019   HGB 15.5 03/27/2019   HCT 45.3 03/27/2019   PLT 186.0 03/27/2019   GLUCOSE 112 (H) 03/27/2019   CHOL 150 07/24/2018   TRIG 103.0 07/24/2018   HDL 34.30 (L) 07/24/2018   LDLCALC 95 07/24/2018   ALT 9 07/24/2018   AST 13 07/24/2018   NA 139 03/27/2019   K 4.3 03/27/2019   CL 107 03/27/2019   CREATININE 1.30 03/27/2019   BUN 18 03/27/2019   CO2 24 03/27/2019   TSH 2.21 07/24/2018   PSA 0.02 (L) 07/24/2018   INR 1.09  02/27/2014    Dg Chest 2 View  Result Date: 11/03/2016 CLINICAL DATA:  Shortness of breath. EXAM: CHEST  2 VIEW COMPARISON:  03/21/2016. FINDINGS: Mediastinum and hilar structures are normal. Stable cardiomegaly. No focal alveolar infiltrate. No pleural effusion or pneumothorax. Pleural thickening on the right has improved. Old right rib fractures noted. Metallic densities noted over the right chest . IMPRESSION: 1.  Stable cardiomegaly.  No acute pulmonary disease. 2.  Old right rib fractures are again noted. Electronically Signed   By: Marcello Moores  Register   On: 11/03/2016 13:29    Assessment & Plan:   Baron was seen today for hypertension and anemia.  Diagnoses and all orders for this visit:  Essential hypertension- His blood pressure is adequately well controlled.  Electrolytes and renal function are normal. -     Basic metabolic panel; Future  Hyperparathyroidism ([place])- His calcium level is normal.  His PTH level is pending. -     Basic metabolic panel; Future -     PTH, intact and calcium; Future  B12 deficiency- He will continue monthly B12 injections. -     CBC with Differential/Platelet; Future -     Folate; Future -     cyanocobalamin ((VITAMIN B-12)) injection 1,000 mcg  Chronic renal disease, stage 3, moderately decreased glomerular filtration rate (GFR) between 30-59 mL/min/1.73 square meter ([place])- His renal function is stable.  He agrees to avoid nephrotoxic agents.  Will continue to maintain control of his blood pressure.   I am having Lamaj N. Duplessis maintain his cyanocobalamin, amLODipine, irbesartan, and tamsulosin. We administered cyanocobalamin.  Meds ordered this encounter  Medications  . cyanocobalamin ((VITAMIN B-12)) injection 1,000 mcg     Follow-up: Return in about 6 months (around 09/26/2019).  Scarlette Calico, MD

## 2019-03-28 LAB — PTH, INTACT AND CALCIUM
Calcium: 8.9 mg/dL (ref 8.6–10.3)
PTH: 20 pg/mL (ref 14–64)

## 2019-04-25 ENCOUNTER — Ambulatory Visit (INDEPENDENT_AMBULATORY_CARE_PROVIDER_SITE_OTHER): Payer: Medicare Other

## 2019-04-25 DIAGNOSIS — E538 Deficiency of other specified B group vitamins: Secondary | ICD-10-CM

## 2019-04-25 MED ORDER — CYANOCOBALAMIN 1000 MCG/ML IJ SOLN
1000.0000 ug | Freq: Once | INTRAMUSCULAR | Status: AC
  Administered 2019-04-25: 1000 ug via INTRAMUSCULAR

## 2019-04-25 NOTE — Progress Notes (Signed)
I have reviewed and agree.

## 2019-04-26 ENCOUNTER — Other Ambulatory Visit: Payer: Self-pay | Admitting: Internal Medicine

## 2019-04-26 DIAGNOSIS — I10 Essential (primary) hypertension: Secondary | ICD-10-CM

## 2019-05-27 ENCOUNTER — Ambulatory Visit (INDEPENDENT_AMBULATORY_CARE_PROVIDER_SITE_OTHER): Payer: Medicare Other

## 2019-05-27 DIAGNOSIS — E538 Deficiency of other specified B group vitamins: Secondary | ICD-10-CM | POA: Diagnosis not present

## 2019-05-27 MED ORDER — CYANOCOBALAMIN 1000 MCG/ML IJ SOLN
1000.0000 ug | Freq: Once | INTRAMUSCULAR | Status: AC
  Administered 2019-05-27: 1000 ug via INTRAMUSCULAR

## 2019-05-27 NOTE — Progress Notes (Signed)
I have reviewed and agree.

## 2019-06-27 ENCOUNTER — Ambulatory Visit (INDEPENDENT_AMBULATORY_CARE_PROVIDER_SITE_OTHER): Payer: Medicare Other

## 2019-06-27 ENCOUNTER — Other Ambulatory Visit: Payer: Self-pay

## 2019-06-27 DIAGNOSIS — E538 Deficiency of other specified B group vitamins: Secondary | ICD-10-CM | POA: Diagnosis not present

## 2019-06-27 MED ORDER — CYANOCOBALAMIN 1000 MCG/ML IJ SOLN
1000.0000 ug | Freq: Once | INTRAMUSCULAR | Status: AC
  Administered 2019-06-27: 1000 ug via INTRAMUSCULAR

## 2019-06-27 NOTE — Progress Notes (Signed)
I have reviewed and agree.

## 2019-07-25 ENCOUNTER — Other Ambulatory Visit: Payer: Self-pay | Admitting: Internal Medicine

## 2019-07-25 DIAGNOSIS — I10 Essential (primary) hypertension: Secondary | ICD-10-CM

## 2019-07-25 DIAGNOSIS — N401 Enlarged prostate with lower urinary tract symptoms: Secondary | ICD-10-CM

## 2019-07-25 MED ORDER — TAMSULOSIN HCL 0.4 MG PO CAPS: 0.4000 mg | ORAL_CAPSULE | Freq: Every day | ORAL | 1 refills | Status: DC

## 2019-07-29 ENCOUNTER — Ambulatory Visit (INDEPENDENT_AMBULATORY_CARE_PROVIDER_SITE_OTHER): Payer: Medicare Other

## 2019-07-29 DIAGNOSIS — Z23 Encounter for immunization: Secondary | ICD-10-CM

## 2019-07-29 DIAGNOSIS — E538 Deficiency of other specified B group vitamins: Secondary | ICD-10-CM

## 2019-07-29 MED ORDER — CYANOCOBALAMIN 1000 MCG/ML IJ SOLN
1000.0000 ug | Freq: Once | INTRAMUSCULAR | Status: AC
  Administered 2019-07-29: 1000 ug via INTRAMUSCULAR

## 2019-07-29 NOTE — Addendum Note (Signed)
Addended by: Ander Slade on: 07/29/2019 10:04 AM   Modules accepted: Orders

## 2019-07-29 NOTE — Progress Notes (Signed)
I have reviewed and agree.

## 2019-08-28 ENCOUNTER — Ambulatory Visit (INDEPENDENT_AMBULATORY_CARE_PROVIDER_SITE_OTHER): Payer: Medicare Other

## 2019-08-28 ENCOUNTER — Ambulatory Visit: Payer: Medicare Other

## 2019-08-28 DIAGNOSIS — E538 Deficiency of other specified B group vitamins: Secondary | ICD-10-CM

## 2019-08-28 MED ORDER — CYANOCOBALAMIN 1000 MCG/ML IJ SOLN
1000.0000 ug | Freq: Once | INTRAMUSCULAR | Status: AC
  Administered 2019-08-28: 1000 ug via INTRAMUSCULAR

## 2019-08-28 NOTE — Progress Notes (Signed)
I have reviewed and agree.

## 2019-09-27 ENCOUNTER — Other Ambulatory Visit: Payer: Self-pay

## 2019-09-27 ENCOUNTER — Ambulatory Visit (INDEPENDENT_AMBULATORY_CARE_PROVIDER_SITE_OTHER): Payer: Medicare Other

## 2019-09-27 DIAGNOSIS — E538 Deficiency of other specified B group vitamins: Secondary | ICD-10-CM | POA: Diagnosis not present

## 2019-09-27 MED ORDER — CYANOCOBALAMIN 1000 MCG/ML IJ SOLN
1000.0000 ug | Freq: Once | INTRAMUSCULAR | Status: AC
  Administered 2019-09-27: 1000 ug via INTRAMUSCULAR

## 2019-09-27 NOTE — Progress Notes (Signed)
I have reviewed and agree.

## 2019-10-28 ENCOUNTER — Other Ambulatory Visit: Payer: Self-pay

## 2019-10-28 ENCOUNTER — Ambulatory Visit (INDEPENDENT_AMBULATORY_CARE_PROVIDER_SITE_OTHER): Payer: Medicare Other

## 2019-10-28 DIAGNOSIS — E538 Deficiency of other specified B group vitamins: Secondary | ICD-10-CM

## 2019-10-28 MED ORDER — CYANOCOBALAMIN 1000 MCG/ML IJ SOLN
1000.0000 ug | Freq: Once | INTRAMUSCULAR | Status: AC
  Administered 2019-10-28: 11:00:00 1000 ug via INTRAMUSCULAR

## 2019-10-28 NOTE — Progress Notes (Signed)
I have reviewed and agree.

## 2019-10-31 ENCOUNTER — Telehealth: Payer: Self-pay

## 2019-10-31 ENCOUNTER — Other Ambulatory Visit: Payer: Self-pay | Admitting: Internal Medicine

## 2019-10-31 DIAGNOSIS — I10 Essential (primary) hypertension: Secondary | ICD-10-CM

## 2019-10-31 MED ORDER — IRBESARTAN 300 MG PO TABS: 300.0000 mg | ORAL_TABLET | Freq: Every day | ORAL | 0 refills | Status: DC

## 2019-10-31 NOTE — Telephone Encounter (Signed)
RX sent  TJ 

## 2019-10-31 NOTE — Telephone Encounter (Signed)
Pt has scheduled an appt for 12.7.20 at 9:40am  Can irbesartan be sent in to [facility] in [place].

## 2019-11-04 ENCOUNTER — Other Ambulatory Visit: Payer: Self-pay

## 2019-11-04 ENCOUNTER — Ambulatory Visit (INDEPENDENT_AMBULATORY_CARE_PROVIDER_SITE_OTHER): Payer: Medicare Other | Admitting: Internal Medicine

## 2019-11-04 ENCOUNTER — Other Ambulatory Visit (INDEPENDENT_AMBULATORY_CARE_PROVIDER_SITE_OTHER): Payer: Medicare Other

## 2019-11-04 ENCOUNTER — Encounter: Payer: Self-pay | Admitting: Internal Medicine

## 2019-11-04 VITALS — BP 150/80 | HR 67 | Temp 97.5°F | Ht 76.0 in | Wt 215.0 lb

## 2019-11-04 DIAGNOSIS — E7800 Pure hypercholesterolemia, unspecified: Secondary | ICD-10-CM

## 2019-11-04 DIAGNOSIS — C61 Malignant neoplasm of prostate: Secondary | ICD-10-CM | POA: Diagnosis not present

## 2019-11-04 DIAGNOSIS — E213 Hyperparathyroidism, unspecified: Secondary | ICD-10-CM | POA: Diagnosis not present

## 2019-11-04 DIAGNOSIS — E538 Deficiency of other specified B group vitamins: Secondary | ICD-10-CM

## 2019-11-04 DIAGNOSIS — N1831 Chronic kidney disease, stage 3a: Secondary | ICD-10-CM | POA: Diagnosis not present

## 2019-11-04 DIAGNOSIS — I10 Essential (primary) hypertension: Secondary | ICD-10-CM | POA: Diagnosis not present

## 2019-11-04 DIAGNOSIS — Z0000 Encounter for general adult medical examination without abnormal findings: Secondary | ICD-10-CM

## 2019-11-04 LAB — LIPID PANEL
Cholesterol: 153 mg/dL (ref 0–200)
HDL: 34.2 mg/dL — ABNORMAL LOW (ref >39.00)
LDL Cholesterol: 99 mg/dL (ref 0–99)
NonHDL: 118.43
Total CHOL/HDL Ratio: 4
Triglycerides: 99 mg/dL (ref 0.0–149.0)
VLDL: 19.8 mg/dL (ref 0.0–40.0)

## 2019-11-04 LAB — HEPATIC FUNCTION PANEL
ALT: 10 U/L (ref 0–53)
AST: 14 U/L (ref 0–37)
Albumin: 4.2 g/dL (ref 3.5–5.2)
Alkaline Phosphatase: 86 U/L (ref 39–117)
Bilirubin, Direct: 0.2 mg/dL (ref 0.0–0.3)
Total Bilirubin: 1.7 mg/dL — ABNORMAL HIGH (ref 0.2–1.2)
Total Protein: 6.9 g/dL (ref 6.0–8.3)

## 2019-11-04 LAB — BASIC METABOLIC PANEL
BUN: 24 mg/dL — ABNORMAL HIGH (ref 6–23)
CO2: 25 mEq/L (ref 19–32)
Calcium: 9.3 mg/dL (ref 8.4–10.5)
Chloride: 107 mEq/L (ref 96–112)
Creatinine, Ser: 1.4 mg/dL (ref 0.40–1.50)
GFR: 48.11 mL/min — ABNORMAL LOW (ref >60.00)
Glucose, Bld: 121 mg/dL — ABNORMAL HIGH (ref 70–99)
Potassium: 4.7 mEq/L (ref 3.5–5.1)
Sodium: 140 mEq/L (ref 135–145)

## 2019-11-04 LAB — CBC WITH DIFFERENTIAL/PLATELET
Basophils Absolute: 0.1 10*3/uL (ref 0.0–0.1)
Basophils Relative: 0.6 % (ref 0.0–3.0)
Eosinophils Absolute: 0.3 10*3/uL (ref 0.0–0.7)
Eosinophils Relative: 2.7 % (ref 0.0–5.0)
HCT: 46.6 % (ref 39.0–52.0)
Hemoglobin: 16 g/dL (ref 13.0–17.0)
Lymphocytes Relative: 25 % (ref 12.0–46.0)
Lymphs Abs: 2.4 10*3/uL (ref 0.7–4.0)
MCHC: 34.3 g/dL (ref 30.0–36.0)
MCV: 84.4 fl (ref 78.0–100.0)
Monocytes Absolute: 0.6 10*3/uL (ref 0.1–1.0)
Monocytes Relative: 6.3 % (ref 3.0–12.0)
Neutro Abs: 6.4 10*3/uL (ref 1.4–7.7)
Neutrophils Relative %: 65.4 % (ref 43.0–77.0)
Platelets: 179 10*3/uL (ref 150.0–400.0)
RBC: 5.52 Mil/uL (ref 4.22–5.81)
RDW: 13.6 % (ref 11.5–15.5)
WBC: 9.8 10*3/uL (ref 4.0–10.5)

## 2019-11-04 LAB — TSH: TSH: 1.81 u[IU]/mL (ref 0.35–4.50)

## 2019-11-04 LAB — FOLATE: Folate: >24.1 ng/mL (ref >5.9)

## 2019-11-04 LAB — VITAMIN B12: Vitamin B-12: 673 pg/mL (ref 211–911)

## 2019-11-04 LAB — PSA: PSA: 0.01 ng/mL — ABNORMAL LOW (ref 0.10–4.00)

## 2019-11-04 NOTE — Patient Instructions (Signed)

## 2019-11-04 NOTE — Progress Notes (Signed)
Subjective:  Patient ID: Joseph Carter, male    DOB: 08/03/1932  Age: 85 y.o. MRN: [id number]  CC: No chief complaint on file.   This visit occurred during the SARS-CoV-2 public health emergency.  Safety protocols were in place, including screening questions prior to the visit, additional usage of staff PPE, and extensive cleaning of exam room while observing appropriate contact time as indicated for disinfecting solutions.    HPI Joseph Carter presents for a CPX.  He feels well and offers no complaints.  He is taking the oral B12 supplement.  Outpatient Medications Prior to Visit  Medication Sig Dispense Refill  . amLODipine (NORVASC) 5 MG tablet TAKE 1 TABLET ONCE DAILY 90 tablet 1  . cyanocobalamin 2000 MCG tablet Take 1 tablet (2,000 mcg total) by mouth daily. 90 tablet 1  . irbesartan (AVAPRO) 300 MG tablet Take 1 tablet (300 mg total) by mouth daily. 90 tablet 0  . tamsulosin (FLOMAX) 0.4 MG CAPS capsule Take 1 capsule (0.4 mg total) by mouth at bedtime. 90 capsule 1   No facility-administered medications prior to visit.     ROS Review of Systems  Constitutional: Negative.  Negative for diaphoresis and fatigue.  HENT: Negative.   Eyes: Negative.   Respiratory: Negative for cough, chest tightness, shortness of breath and wheezing.   Cardiovascular: Negative for chest pain, palpitations and leg swelling.  Gastrointestinal: Negative for abdominal pain, constipation, diarrhea, nausea and vomiting.  Endocrine: Negative.   Genitourinary: Negative.  Negative for difficulty urinating, scrotal swelling and testicular pain.  Musculoskeletal: Negative.  Negative for arthralgias and myalgias.  Skin: Negative.  Negative for color change.  Neurological: Negative.  Negative for dizziness, weakness, light-headedness and headaches.  Hematological: Negative for adenopathy. Does not bruise/bleed easily.  Psychiatric/Behavioral: Negative.     Objective:  BP (!) 150/80 (BP Location: Left  Arm, Patient Position: Sitting, Cuff Size: Normal)   Pulse 67   Temp (!) 97.5 F (36.4 C) (Oral)   Ht 6\' 4"  (1.93 m)   Wt 215 lb (97.5 kg)   SpO2 96%   BMI 26.17 kg/m   BP Readings from Last 3 Encounters:  11/04/19 (!) 150/80  03/27/19 (!) 144/82  07/24/18 130/84    Wt Readings from Last 3 Encounters:  11/04/19 215 lb (97.5 kg)  03/27/19 213 lb 8 oz (96.8 kg)  07/24/18 217 lb 4 oz (98.5 kg)    Physical Exam Vitals signs reviewed.  Constitutional:      Appearance: Normal appearance.  HENT:     Nose: Nose normal.     Mouth/Throat:     Mouth: Mucous membranes are moist.  Eyes:     General: No scleral icterus.    Conjunctiva/sclera: Conjunctivae normal.  Neck:     Musculoskeletal: Neck supple.  Cardiovascular:     Rate and Rhythm: Normal rate and regular rhythm. Occasional extrasystoles are present.    Heart sounds: No murmur.     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   ABNORMAL  Pulmonary:     Effort: Pulmonary effort is normal.     Breath sounds: No stridor. No wheezing or rales.  Abdominal:     General: Abdomen is flat. There is no distension.     Palpations: There is no hepatomegaly, splenomegaly or mass.     Tenderness: There is no abdominal tenderness. There is no guarding.  Musculoskeletal:  Normal range of motion.     Right lower leg: No edema.     Left lower leg: No edema.  Lymphadenopathy:     Cervical: No cervical adenopathy.  Skin:    General: Skin is warm and dry.  Neurological:     General: No focal deficit present.     Mental Status: He is alert.  Psychiatric:        Mood and Affect: Mood normal.        Behavior: Behavior normal.     Lab Results  Component Value Date   WBC 9.8 11/04/2019   HGB 16.0 11/04/2019   HCT 46.6 11/04/2019   PLT 179.0 11/04/2019   GLUCOSE 121 (H) 11/04/2019   CHOL 153 11/04/2019   TRIG 99.0 11/04/2019   HDL  34.20 (L) 11/04/2019   LDLCALC 99 11/04/2019   ALT 10 11/04/2019   AST 14 11/04/2019   NA 140 11/04/2019   K 4.7 11/04/2019   CL 107 11/04/2019   CREATININE 1.40 11/04/2019   BUN 24 (H) 11/04/2019   CO2 25 11/04/2019   TSH 1.81 11/04/2019   PSA 0.01 (L) 11/04/2019   INR 1.09 02/27/2014    Dg Chest 2 View  Result Date: 11/03/2016 CLINICAL DATA:  Shortness of breath. EXAM: CHEST  2 VIEW COMPARISON:  03/21/2016. FINDINGS: Mediastinum and hilar structures are normal. Stable cardiomegaly. No focal alveolar infiltrate. No pleural effusion or pneumothorax. Pleural thickening on the right has improved. Old right rib fractures noted. Metallic densities noted over the right chest . IMPRESSION: 1.  Stable cardiomegaly.  No acute pulmonary disease. 2.  Old right rib fractures are again noted. Electronically Signed   By: Marcello Moores  Register   On: 11/03/2016 13:29    Assessment & Plan:   Diagnoses and all orders for this visit:  Essential hypertension- His blood pressure is adequately well controlled.  Electrolytes and renal function are normal.  He is not bothered by the PVCs. -     Basic metabolic panel; Future -     TSH; Future -     EKG 12-Lead  Hyperparathyroidism ([place])- His PTH and calcium levels are normal now. -     Basic metabolic panel; Future -     PTH, intact and calcium; Future  Prostate cancer [facility])- His PSA remains low.  This is a reassuring sign that he does not have a recurrence of prostate cancer. -     PSA; Future  Stage 3a chronic kidney disease- His renal function is stable. -     Basic metabolic panel; Future  B12 deficiency- He has a normal B12 level on the oral supplementation. -     CBC with Differential; Future -     Folate; Future -     B12; Future  Routine general medical examination at a health care facility- Exam completed, labs reviewed, vaccines reviewed and updated, no cancer screenings are indicated, patient education was given.  Pure  hypercholesterolemia- Treatment with a statin is not indicated. -     Lipid panel; Future -     TSH; Future -     Hepatic function panel; Future   I am having Khari N. Hari maintain his cyanocobalamin, amLODipine, tamsulosin, and irbesartan.  No orders of the defined types were placed in this encounter.    Follow-up: Return in about 6 months (around 05/04/2020).  Scarlette Calico, MD

## 2019-11-05 ENCOUNTER — Encounter: Payer: Self-pay | Admitting: Internal Medicine

## 2019-11-05 LAB — PTH, INTACT AND CALCIUM
Calcium: 9.3 mg/dL (ref 8.6–10.3)
PTH: 18 pg/mL (ref 14–64)

## 2019-12-02 ENCOUNTER — Ambulatory Visit (INDEPENDENT_AMBULATORY_CARE_PROVIDER_SITE_OTHER): Payer: Medicare Other

## 2019-12-02 ENCOUNTER — Other Ambulatory Visit: Payer: Self-pay

## 2019-12-02 DIAGNOSIS — E538 Deficiency of other specified B group vitamins: Secondary | ICD-10-CM

## 2019-12-02 MED ORDER — CYANOCOBALAMIN 1000 MCG/ML IJ SOLN
1000.0000 ug | Freq: Once | INTRAMUSCULAR | Status: AC
  Administered 2019-12-02: 1000 ug via INTRAMUSCULAR

## 2019-12-02 NOTE — Progress Notes (Signed)
I have reviewed and agree.

## 2020-01-02 ENCOUNTER — Ambulatory Visit (INDEPENDENT_AMBULATORY_CARE_PROVIDER_SITE_OTHER): Payer: Medicare Other | Admitting: *Deleted

## 2020-01-02 ENCOUNTER — Other Ambulatory Visit: Payer: Self-pay

## 2020-01-02 DIAGNOSIS — E538 Deficiency of other specified B group vitamins: Secondary | ICD-10-CM | POA: Diagnosis not present

## 2020-01-02 MED ORDER — CYANOCOBALAMIN 1000 MCG/ML IJ SOLN
1000.0000 ug | Freq: Once | INTRAMUSCULAR | Status: AC
  Administered 2020-01-02: 1000 ug via INTRAMUSCULAR

## 2020-01-02 NOTE — Progress Notes (Signed)
I have reviewed and agree.

## 2020-01-09 ENCOUNTER — Other Ambulatory Visit: Payer: Self-pay | Admitting: Internal Medicine

## 2020-01-09 DIAGNOSIS — N401 Enlarged prostate with lower urinary tract symptoms: Secondary | ICD-10-CM

## 2020-01-30 ENCOUNTER — Ambulatory Visit (INDEPENDENT_AMBULATORY_CARE_PROVIDER_SITE_OTHER): Payer: Medicare Other | Admitting: *Deleted

## 2020-01-30 ENCOUNTER — Other Ambulatory Visit: Payer: Self-pay

## 2020-01-30 DIAGNOSIS — E538 Deficiency of other specified B group vitamins: Secondary | ICD-10-CM | POA: Diagnosis not present

## 2020-01-30 MED ORDER — CYANOCOBALAMIN 1000 MCG/ML IJ SOLN
1000.0000 ug | Freq: Once | INTRAMUSCULAR | Status: AC
  Administered 2020-01-30: 1000 ug via INTRAMUSCULAR

## 2020-01-30 NOTE — Progress Notes (Addendum)
I have reviewed and agree.

## 2020-01-31 ENCOUNTER — Ambulatory Visit: Payer: Medicare Other | Attending: Internal Medicine

## 2020-01-31 DIAGNOSIS — Z23 Encounter for immunization: Secondary | ICD-10-CM | POA: Insufficient documentation

## 2020-01-31 NOTE — Progress Notes (Signed)
   Covid-19 Vaccination Clinic  Name:  Joseph Carter    MRN: [id number] DOB: [birth year]  01/31/2020  [name] was observed post Covid-19 immunization for 15 minutes without incident. He was provided with Vaccine Information Sheet and instruction to access the V-Safe system.   [name] was instructed to call 911 with any severe reactions post vaccine: [name] Difficulty breathing  . Swelling of face and throat  . A fast heartbeat  . A bad rash all over body  . Dizziness and weakness   Immunizations Administered    Name Date Dose VIS Date Route   Pfizer COVID-19 Vaccine 01/31/2020  3:24 PM 0.3 mL 11/08/2019 Intramuscular   Manufacturer: [place]   Lot: [id number]   [place]: [id number]

## 2020-02-17 ENCOUNTER — Other Ambulatory Visit: Payer: Self-pay | Admitting: Internal Medicine

## 2020-02-17 DIAGNOSIS — I10 Essential (primary) hypertension: Secondary | ICD-10-CM

## 2020-03-02 ENCOUNTER — Ambulatory Visit (INDEPENDENT_AMBULATORY_CARE_PROVIDER_SITE_OTHER): Payer: Medicare Other

## 2020-03-02 ENCOUNTER — Other Ambulatory Visit: Payer: Self-pay

## 2020-03-02 DIAGNOSIS — E538 Deficiency of other specified B group vitamins: Secondary | ICD-10-CM

## 2020-03-02 MED ORDER — CYANOCOBALAMIN 1000 MCG/ML IJ SOLN
1000.0000 ug | Freq: Once | INTRAMUSCULAR | Status: AC
  Administered 2020-03-02: 09:00:00 1000 ug via INTRAMUSCULAR

## 2020-03-02 NOTE — Progress Notes (Signed)
I have reviewed and agree.

## 2020-03-03 ENCOUNTER — Ambulatory Visit: Payer: Medicare Other | Attending: Internal Medicine

## 2020-03-03 DIAGNOSIS — Z23 Encounter for immunization: Secondary | ICD-10-CM

## 2020-03-03 NOTE — Progress Notes (Signed)
   Covid-19 Vaccination Clinic  Name:  Joseph Carter    MRN: [id number] DOB: 19-Jan-1932  03/03/2020  [name] was observed post Covid-19 immunization for 15 minutes without incident. He was provided with Vaccine Information Sheet and instruction to access the V-Safe system.   Mr. Water was instructed to call 911 with any severe reactions post vaccine: [name] Difficulty breathing  . Swelling of face and throat  . A fast heartbeat  . A bad rash all over body  . Dizziness and weakness   Immunizations Administered    Name Date Dose VIS Date Route   Pfizer COVID-19 Vaccine 03/03/2020 10:27 AM 0.3 mL 11/08/2019 Intramuscular   Manufacturer: [employer]   Lot: [id number]   [place]: [id number]

## 2020-04-01 ENCOUNTER — Other Ambulatory Visit: Payer: Self-pay

## 2020-04-01 ENCOUNTER — Ambulatory Visit (INDEPENDENT_AMBULATORY_CARE_PROVIDER_SITE_OTHER): Payer: Medicare Other | Admitting: *Deleted

## 2020-04-01 DIAGNOSIS — E538 Deficiency of other specified B group vitamins: Secondary | ICD-10-CM

## 2020-04-01 MED ORDER — CYANOCOBALAMIN 1000 MCG/ML IJ SOLN
1000.0000 ug | Freq: Once | INTRAMUSCULAR | Status: AC
  Administered 2020-04-01: 09:00:00 1000 ug via INTRAMUSCULAR

## 2020-04-01 NOTE — Progress Notes (Signed)
Pls cosign for B12 inj../lmb  

## 2020-04-15 ENCOUNTER — Other Ambulatory Visit: Payer: Self-pay | Admitting: Internal Medicine

## 2020-04-15 DIAGNOSIS — I10 Essential (primary) hypertension: Secondary | ICD-10-CM

## 2020-05-04 ENCOUNTER — Ambulatory Visit (INDEPENDENT_AMBULATORY_CARE_PROVIDER_SITE_OTHER): Payer: Medicare Other | Admitting: *Deleted

## 2020-05-04 ENCOUNTER — Other Ambulatory Visit: Payer: Self-pay

## 2020-05-04 DIAGNOSIS — E538 Deficiency of other specified B group vitamins: Secondary | ICD-10-CM

## 2020-05-04 MED ORDER — CYANOCOBALAMIN 1000 MCG/ML IJ SOLN
1000.0000 ug | Freq: Once | INTRAMUSCULAR | Status: AC
  Administered 2020-05-04: 1000 ug via INTRAMUSCULAR

## 2020-05-04 NOTE — Progress Notes (Signed)
Pls cosign for B12 inj../lmb  

## 2020-06-03 ENCOUNTER — Other Ambulatory Visit: Payer: Self-pay

## 2020-06-03 ENCOUNTER — Ambulatory Visit (INDEPENDENT_AMBULATORY_CARE_PROVIDER_SITE_OTHER): Payer: Medicare Other

## 2020-06-03 DIAGNOSIS — E538 Deficiency of other specified B group vitamins: Secondary | ICD-10-CM | POA: Diagnosis not present

## 2020-06-03 MED ORDER — CYANOCOBALAMIN 1000 MCG/ML IJ SOLN
1000.0000 ug | INTRAMUSCULAR | Status: AC
  Administered 2020-06-03 – 2021-05-19 (×6): 1000 ug via INTRAMUSCULAR

## 2020-06-03 NOTE — Progress Notes (Signed)
Pt here for monthly B12 injection per Dr Ronnald Ramp.  B12 1073mcg given IM left deltoid and pt tolerated injection well.  Next B12 injection scheduled for 07/06/20

## 2020-07-06 ENCOUNTER — Other Ambulatory Visit: Payer: Self-pay | Admitting: Internal Medicine

## 2020-07-06 ENCOUNTER — Ambulatory Visit (INDEPENDENT_AMBULATORY_CARE_PROVIDER_SITE_OTHER): Payer: Medicare Other | Admitting: *Deleted

## 2020-07-06 ENCOUNTER — Other Ambulatory Visit: Payer: Self-pay

## 2020-07-06 DIAGNOSIS — E538 Deficiency of other specified B group vitamins: Secondary | ICD-10-CM

## 2020-07-06 MED ORDER — CYANOCOBALAMIN 1000 MCG/ML IJ SOLN
1000.0000 ug | Freq: Once | INTRAMUSCULAR | Status: AC
  Administered 2020-07-06: 1000 ug via INTRAMUSCULAR

## 2020-07-06 NOTE — Progress Notes (Signed)
Pls cosign for B12 inj../lmb  

## 2020-07-25 ENCOUNTER — Other Ambulatory Visit: Payer: Self-pay | Admitting: Internal Medicine

## 2020-07-25 DIAGNOSIS — I10 Essential (primary) hypertension: Secondary | ICD-10-CM

## 2020-07-27 ENCOUNTER — Other Ambulatory Visit: Payer: Self-pay | Admitting: Internal Medicine

## 2020-07-27 DIAGNOSIS — I10 Essential (primary) hypertension: Secondary | ICD-10-CM

## 2020-07-27 DIAGNOSIS — N401 Enlarged prostate with lower urinary tract symptoms: Secondary | ICD-10-CM

## 2020-08-06 ENCOUNTER — Other Ambulatory Visit: Payer: Self-pay

## 2020-08-06 ENCOUNTER — Ambulatory Visit (INDEPENDENT_AMBULATORY_CARE_PROVIDER_SITE_OTHER): Payer: Medicare Other

## 2020-08-06 DIAGNOSIS — E538 Deficiency of other specified B group vitamins: Secondary | ICD-10-CM

## 2020-08-06 MED ORDER — CYANOCOBALAMIN 1000 MCG/ML IJ SOLN
1000.0000 ug | Freq: Once | INTRAMUSCULAR | Status: AC
  Administered 2020-08-06: 1000 ug via INTRAMUSCULAR

## 2020-08-06 NOTE — Progress Notes (Addendum)
I have reviewed and agree  B12 given today in left arm. Patient tolerated injection well/cjs

## 2020-08-08 ENCOUNTER — Other Ambulatory Visit: Payer: Self-pay | Admitting: Internal Medicine

## 2020-08-08 DIAGNOSIS — I10 Essential (primary) hypertension: Secondary | ICD-10-CM

## 2020-09-10 ENCOUNTER — Other Ambulatory Visit: Payer: Self-pay

## 2020-09-10 ENCOUNTER — Ambulatory Visit (INDEPENDENT_AMBULATORY_CARE_PROVIDER_SITE_OTHER): Payer: Medicare Other | Admitting: *Deleted

## 2020-09-10 DIAGNOSIS — Z23 Encounter for immunization: Secondary | ICD-10-CM

## 2020-09-10 DIAGNOSIS — E538 Deficiency of other specified B group vitamins: Secondary | ICD-10-CM

## 2020-09-10 MED ORDER — CYANOCOBALAMIN 1000 MCG/ML IJ SOLN
1000.0000 ug | Freq: Once | INTRAMUSCULAR | Status: AC
  Administered 2020-09-10: 1000 ug via INTRAMUSCULAR

## 2020-09-10 NOTE — Progress Notes (Signed)
Pls cosign for B12 inj../lmb  

## 2020-10-12 ENCOUNTER — Other Ambulatory Visit: Payer: Self-pay

## 2020-10-12 ENCOUNTER — Ambulatory Visit (INDEPENDENT_AMBULATORY_CARE_PROVIDER_SITE_OTHER): Payer: Medicare Other

## 2020-10-12 DIAGNOSIS — E538 Deficiency of other specified B group vitamins: Secondary | ICD-10-CM

## 2020-10-12 NOTE — Progress Notes (Signed)
Pt here for monthly B12 injection per Dr. Jones  B12 1000mcg given IM, and pt tolerated injection well.   

## 2020-11-05 ENCOUNTER — Encounter: Payer: Self-pay | Admitting: Internal Medicine

## 2020-11-05 ENCOUNTER — Ambulatory Visit (INDEPENDENT_AMBULATORY_CARE_PROVIDER_SITE_OTHER): Payer: Medicare Other | Admitting: Internal Medicine

## 2020-11-05 ENCOUNTER — Other Ambulatory Visit: Payer: Self-pay

## 2020-11-05 VITALS — BP 142/82 | HR 65 | Temp 97.6°F | Resp 16 | Ht 76.0 in | Wt 215.0 lb

## 2020-11-05 DIAGNOSIS — Z0000 Encounter for general adult medical examination without abnormal findings: Secondary | ICD-10-CM

## 2020-11-05 DIAGNOSIS — Z8546 Personal history of malignant neoplasm of prostate: Secondary | ICD-10-CM | POA: Insufficient documentation

## 2020-11-05 DIAGNOSIS — Z23 Encounter for immunization: Secondary | ICD-10-CM

## 2020-11-05 DIAGNOSIS — E213 Hyperparathyroidism, unspecified: Secondary | ICD-10-CM | POA: Diagnosis not present

## 2020-11-05 DIAGNOSIS — N401 Enlarged prostate with lower urinary tract symptoms: Secondary | ICD-10-CM

## 2020-11-05 DIAGNOSIS — I10 Essential (primary) hypertension: Secondary | ICD-10-CM

## 2020-11-05 DIAGNOSIS — E538 Deficiency of other specified B group vitamins: Secondary | ICD-10-CM | POA: Diagnosis not present

## 2020-11-05 DIAGNOSIS — N1831 Chronic kidney disease, stage 3a: Secondary | ICD-10-CM | POA: Diagnosis not present

## 2020-11-05 DIAGNOSIS — E7800 Pure hypercholesterolemia, unspecified: Secondary | ICD-10-CM

## 2020-11-05 DIAGNOSIS — R739 Hyperglycemia, unspecified: Secondary | ICD-10-CM

## 2020-11-05 DIAGNOSIS — Z0001 Encounter for general adult medical examination with abnormal findings: Secondary | ICD-10-CM | POA: Insufficient documentation

## 2020-11-05 LAB — CBC WITH DIFFERENTIAL/PLATELET
Basophils Absolute: 0.1 10*3/uL (ref 0.0–0.1)
Basophils Relative: 0.8 % (ref 0.0–3.0)
Eosinophils Absolute: 0.2 10*3/uL (ref 0.0–0.7)
Eosinophils Relative: 2.2 % (ref 0.0–5.0)
HCT: 45.6 % (ref 39.0–52.0)
Hemoglobin: 15.6 g/dL (ref 13.0–17.0)
Lymphocytes Relative: 29.1 % (ref 12.0–46.0)
Lymphs Abs: 2.8 10*3/uL (ref 0.7–4.0)
MCHC: 34.3 g/dL (ref 30.0–36.0)
MCV: 84.7 fl (ref 78.0–100.0)
Monocytes Absolute: 0.7 10*3/uL (ref 0.1–1.0)
Monocytes Relative: 7.3 % (ref 3.0–12.0)
Neutro Abs: 5.8 10*3/uL (ref 1.4–7.7)
Neutrophils Relative %: 60.6 % (ref 43.0–77.0)
Platelets: 178 10*3/uL (ref 150.0–400.0)
RBC: 5.38 Mil/uL (ref 4.22–5.81)
RDW: 13.6 % (ref 11.5–15.5)
WBC: 9.6 10*3/uL (ref 4.0–10.5)

## 2020-11-05 LAB — LIPID PANEL
Cholesterol: 158 mg/dL (ref 0–200)
HDL: 39.7 mg/dL (ref >39.00)
LDL Cholesterol: 102 mg/dL — ABNORMAL HIGH (ref 0–99)
NonHDL: 118.04
Total CHOL/HDL Ratio: 4
Triglycerides: 80 mg/dL (ref 0.0–149.0)
VLDL: 16 mg/dL (ref 0.0–40.0)

## 2020-11-05 LAB — BASIC METABOLIC PANEL
BUN: 21 mg/dL (ref 6–23)
CO2: 27 mEq/L (ref 19–32)
Calcium: 8.8 mg/dL (ref 8.4–10.5)
Chloride: 107 mEq/L (ref 96–112)
Creatinine, Ser: 1.32 mg/dL (ref 0.40–1.50)
GFR: 48.85 mL/min — ABNORMAL LOW (ref >60.00)
Glucose, Bld: 110 mg/dL — ABNORMAL HIGH (ref 70–99)
Potassium: 4.5 mEq/L (ref 3.5–5.1)
Sodium: 141 mEq/L (ref 135–145)

## 2020-11-05 LAB — URINALYSIS, ROUTINE W REFLEX MICROSCOPIC
Bilirubin Urine: NEGATIVE
Hgb urine dipstick: NEGATIVE
Ketones, ur: NEGATIVE
Leukocytes,Ua: NEGATIVE
Nitrite: NEGATIVE
RBC / HPF: NONE SEEN (ref 0–?)
Specific Gravity, Urine: 1.02 (ref 1.000–1.030)
Total Protein, Urine: 30 — AB
Urine Glucose: NEGATIVE
Urobilinogen, UA: 1 (ref 0.0–1.0)
WBC, UA: NONE SEEN (ref 0–?)
pH: 6.5 (ref 5.0–8.0)

## 2020-11-05 LAB — HEPATIC FUNCTION PANEL
ALT: 13 U/L (ref 0–53)
AST: 14 U/L (ref 0–37)
Albumin: 4.1 g/dL (ref 3.5–5.2)
Alkaline Phosphatase: 75 U/L (ref 39–117)
Bilirubin, Direct: 0.2 mg/dL (ref 0.0–0.3)
Total Bilirubin: 1.2 mg/dL (ref 0.2–1.2)
Total Protein: 6.9 g/dL (ref 6.0–8.3)

## 2020-11-05 LAB — TSH: TSH: 2.39 u[IU]/mL (ref 0.35–4.50)

## 2020-11-05 LAB — HEMOGLOBIN A1C: Hgb A1c MFr Bld: 5.4 % (ref 4.6–6.5)

## 2020-11-05 LAB — FOLATE: Folate: >23.6 ng/mL (ref >5.9)

## 2020-11-05 LAB — PSA: PSA: 0 ng/mL — ABNORMAL LOW (ref 0.10–4.00)

## 2020-11-05 MED ORDER — CYANOCOBALAMIN 1000 MCG/ML IJ SOLN
1000.0000 ug | Freq: Once | INTRAMUSCULAR | Status: AC
  Administered 2020-11-05: 1000 ug via INTRAMUSCULAR

## 2020-11-05 MED ORDER — TAMSULOSIN HCL 0.4 MG PO CAPS: 0.4000 mg | ORAL_CAPSULE | Freq: Every day | ORAL | 1 refills | Status: DC

## 2020-11-05 NOTE — Progress Notes (Signed)
Subjective:  Patient ID: Joseph Carter, male    DOB: 03-19-1932  Age: 86 y.o. MRN: [id number]  CC: Annual Exam and Hypertension  This visit occurred during the SARS-CoV-2 public health emergency.  Safety protocols were in place, including screening questions prior to the visit, additional usage of staff PPE, and extensive cleaning of exam room while observing appropriate contact time as indicated for disinfecting solutions.    HPI Joseph Carter presents for a CPX.  He tells me his blood pressure has been well controlled.  He is active and denies any recent episodes of dizziness, lightheadedness, headache, blurred vision, chest pain, shortness of breath, palpitations, edema, or fatigue.  Outpatient Medications Prior to Visit  Medication Sig Dispense Refill  . amLODipine (NORVASC) 5 MG tablet TAKE 1 TABLET BY MOUTH EVERY DAY 90 tablet 1  . cyanocobalamin 2000 MCG tablet Take 1 tablet (2,000 mcg total) by mouth daily. 90 tablet 1  . irbesartan (AVAPRO) 300 MG tablet TAKE 1 TABLET(300 MG) BY MOUTH DAILY 90 tablet 0  . tamsulosin (FLOMAX) 0.4 MG CAPS capsule TAKE 1 CAPSULE BY MOUTH AT BEDTIME 90 capsule 1   Facility-Administered Medications Prior to Visit  Medication Dose Route Frequency Provider Last Rate Last Admin  . cyanocobalamin ((VITAMIN B-12)) injection 1,000 mcg  1,000 mcg Intramuscular Q30 days Joseph Lima, MD   1,000 mcg at 10/12/20 1404    ROS Review of Systems  Constitutional: Negative for appetite change, diaphoresis, fatigue and unexpected weight change.  HENT: Negative.   Eyes: Negative for visual disturbance.  Respiratory: Negative for cough, chest tightness, shortness of breath and wheezing.   Cardiovascular: Negative for chest pain, palpitations and leg swelling.  Gastrointestinal: Negative for abdominal pain, constipation, diarrhea, nausea and vomiting.  Endocrine: Negative.   Genitourinary: Negative.  Negative for difficulty urinating and dysuria.   Musculoskeletal: Negative for arthralgias and myalgias.  Skin: Negative.  Negative for color change and pallor.  Neurological: Negative.  Negative for dizziness, weakness and light-headedness.  Hematological: Negative for adenopathy. Does not bruise/bleed easily.  Psychiatric/Behavioral: Negative.     Objective:  BP (!) 142/82   Pulse 65   Temp 97.6 F (36.4 C) (Oral)   Resp 16   Ht 6\' 4"  (1.93 m)   Wt 215 lb (97.5 kg)   SpO2 98%   BMI 26.17 kg/m   BP Readings from Last 3 Encounters:  11/05/20 (!) 142/82  11/04/19 (!) 150/80  03/27/19 (!) 144/82    Wt Readings from Last 3 Encounters:  11/05/20 215 lb (97.5 kg)  11/04/19 215 lb (97.5 kg)  03/27/19 213 lb 8 oz (96.8 kg)    Physical Exam Vitals reviewed.  HENT:     Nose: Nose normal.     Mouth/Throat:     Mouth: Mucous membranes are moist.  Eyes:     General: No scleral icterus.    Conjunctiva/sclera: Conjunctivae normal.  Cardiovascular:     Rate and Rhythm: Normal rate and regular rhythm.     Heart sounds: Murmur heard.   Systolic murmur is present with a grade of 1/6.  No diastolic murmur is present. No gallop.   Pulmonary:     Effort: Pulmonary effort is normal.     Breath sounds: No stridor. No wheezing, rhonchi or rales.  Abdominal:     General: Abdomen is flat.     Palpations: There is no mass.     Tenderness: There is no abdominal tenderness. There is no guarding.  Hernia: No hernia is present.  Musculoskeletal:        General: Normal range of motion.     Cervical back: Neck supple.     Right lower leg: No edema.     Left lower leg: No edema.  Lymphadenopathy:     Cervical: No cervical adenopathy.  Skin:    General: Skin is warm and dry.  Neurological:     General: No focal deficit present.     Mental Status: He is alert and oriented to person, place, and time. Mental status is at baseline.  Psychiatric:        Mood and Affect: Mood normal.        Behavior: Behavior normal.     Lab  Results  Component Value Date   WBC 9.6 11/05/2020   HGB 15.6 11/05/2020   HCT 45.6 11/05/2020   PLT 178.0 11/05/2020   GLUCOSE 110 (H) 11/05/2020   CHOL 158 11/05/2020   TRIG 80.0 11/05/2020   HDL 39.70 11/05/2020   LDLCALC 102 (H) 11/05/2020   ALT 13 11/05/2020   AST 14 11/05/2020   NA 141 11/05/2020   K 4.5 11/05/2020   CL 107 11/05/2020   CREATININE 1.32 11/05/2020   BUN 21 11/05/2020   CO2 27 11/05/2020   TSH 2.39 11/05/2020   PSA 0.00 (L) 11/05/2020   INR 1.09 02/27/2014   HGBA1C 5.4 11/05/2020    DG Chest 2 View  Result Date: 11/03/2016 CLINICAL DATA:  Shortness of breath. EXAM: CHEST  2 VIEW COMPARISON:  03/21/2016. FINDINGS: Mediastinum and hilar structures are normal. Stable cardiomegaly. No focal alveolar infiltrate. No pleural effusion or pneumothorax. Pleural thickening on the right has improved. Old right rib fractures noted. Metallic densities noted over the right chest . IMPRESSION: 1.  Stable cardiomegaly.  No acute pulmonary disease. 2.  Old right rib fractures are again noted. Electronically Signed   By: Marcello Moores  Register   On: 11/03/2016 13:29    Assessment & Plan:   [name] was seen today for annual exam and hypertension.  Diagnoses and all orders for this visit:  Essential hypertension- His blood pressure is adequately well controlled.  Labs are negative for secondary causes or endorgan damage. -     TSH; Future -     Urinalysis, Routine w reflex microscopic; Future -     TSH -     Urinalysis, Routine w reflex microscopic  B12 deficiency- His H&H and folate levels are normal.  Will continue parenteral B12 replacement therapy. -     CBC with Differential/Platelet; Future -     Cancel: Vitamin B12; Future -     Folate; Future -     cyanocobalamin ((VITAMIN B-12)) injection 1,000 mcg -     CBC with Differential/Platelet -     Folate  Stage 3a chronic kidney disease ([place])- His renal function is stable.  His blood pressure is adequately well controlled.   He will avoid nephrotoxic agents. -     Urinalysis, Routine w reflex microscopic; Future -     Urinalysis, Routine w reflex microscopic  History of prostate cancer- There is no evidence of recurrence of prostate cancer. -     PSA; Future -     PSA  Encounter for general adult medical examination with abnormal findings- Exam completed, labs reviewed, vaccines reviewed and updated, no cancer screenings are indicated, patient education was given.  Hyperparathyroidism ([place])- He is asymptomatic with this.  I will monitor his calcium and PTH  level. -     Basic metabolic panel; Future -     PTH, intact and calcium; Future -     PTH, intact and calcium -     Basic metabolic panel  Hyperglycemia- His A1c is normal. -     Hemoglobin A1c; Future -     Hemoglobin A1c  Pure hypercholesterolemia- Statin therapy is not indicated. -     Hepatic function panel; Future -     Lipid panel; Future -     Lipid panel -     Hepatic function panel  Benign prostatic hyperplasia with lower urinary tract symptoms, symptom details unspecified- His symptoms are well controlled with the peripheral alpha-blocker.  Will continue. -     tamsulosin (FLOMAX) 0.4 MG CAPS capsule; Take 1 capsule (0.4 mg total) by mouth at bedtime.  Other orders -     Tdap vaccine greater than or equal to 7yo IM   I have changed Vincenzo N. Murty's tamsulosin. I am also having him maintain his cyanocobalamin, amLODipine, and irbesartan. We administered cyanocobalamin. We will continue to administer cyanocobalamin.  Meds ordered this encounter  Medications  . tamsulosin (FLOMAX) 0.4 MG CAPS capsule    Sig: Take 1 capsule (0.4 mg total) by mouth at bedtime.    Dispense:  90 capsule    Refill:  1  . cyanocobalamin ((VITAMIN B-12)) injection 1,000 mcg   In addition to time spent on CPE, I spent 50 minutes in preparing to see the patient by review of recent labs, imaging and procedures, obtaining and reviewing separately obtained  history, communicating with the patient and family or caregiver, ordering medications, tests or procedures, and documenting clinical information in the EHR including the differential Dx, treatment, and any further evaluation and other management of 1. Essential hypertension 2. B12 deficiency 3. Stage 3a chronic kidney disease ([place]) 4. History of prostate cancer 5. Hyperparathyroidism ([place]) 6. Hyperglycemia 7. Pure hypercholesterolemia 8. Benign prostatic hyperplasia with lower urinary tract symptoms, symptom details unspecified     Follow-up: Return in about 6 months (around 05/06/2021).  Scarlette Calico, MD

## 2020-11-05 NOTE — Patient Instructions (Signed)

## 2020-11-09 ENCOUNTER — Other Ambulatory Visit: Payer: Self-pay | Admitting: Internal Medicine

## 2020-11-09 DIAGNOSIS — I10 Essential (primary) hypertension: Secondary | ICD-10-CM

## 2020-11-09 LAB — PTH, INTACT AND CALCIUM
Calcium: 9.1 mg/dL (ref 8.6–10.3)
PTH: 38 pg/mL (ref 14–64)

## 2020-12-09 ENCOUNTER — Ambulatory Visit (INDEPENDENT_AMBULATORY_CARE_PROVIDER_SITE_OTHER): Payer: Medicare Other

## 2020-12-09 ENCOUNTER — Other Ambulatory Visit: Payer: Self-pay

## 2020-12-09 DIAGNOSIS — E538 Deficiency of other specified B group vitamins: Secondary | ICD-10-CM

## 2020-12-09 NOTE — Progress Notes (Signed)
Pt here for monthly B12 injection per Dr Jones.  B12 1000mcg given IM left deltoid and pt tolerated injection well.  Pt to schedule next B12 injection upon check out.    

## 2021-01-12 ENCOUNTER — Other Ambulatory Visit: Payer: Self-pay

## 2021-01-13 ENCOUNTER — Ambulatory Visit (INDEPENDENT_AMBULATORY_CARE_PROVIDER_SITE_OTHER): Payer: Medicare Other

## 2021-01-13 DIAGNOSIS — E538 Deficiency of other specified B group vitamins: Secondary | ICD-10-CM | POA: Diagnosis not present

## 2021-01-13 MED ORDER — CYANOCOBALAMIN 1000 MCG/ML IJ SOLN
1000.0000 ug | Freq: Once | INTRAMUSCULAR | Status: AC
  Administered 2021-01-13: 1000 ug via INTRAMUSCULAR

## 2021-01-13 NOTE — Progress Notes (Signed)
B12 given. Cosign please.

## 2021-02-11 ENCOUNTER — Ambulatory Visit (INDEPENDENT_AMBULATORY_CARE_PROVIDER_SITE_OTHER): Payer: Medicare Other | Admitting: *Deleted

## 2021-02-11 ENCOUNTER — Other Ambulatory Visit: Payer: Self-pay

## 2021-02-11 DIAGNOSIS — E538 Deficiency of other specified B group vitamins: Secondary | ICD-10-CM

## 2021-02-11 MED ORDER — CYANOCOBALAMIN 1000 MCG/ML IJ SOLN
1000.0000 ug | Freq: Once | INTRAMUSCULAR | Status: AC
  Administered 2021-02-11: 1000 ug via INTRAMUSCULAR

## 2021-02-11 NOTE — Progress Notes (Signed)
Pls cosign for B12 inj../lmb  

## 2021-03-15 ENCOUNTER — Ambulatory Visit (INDEPENDENT_AMBULATORY_CARE_PROVIDER_SITE_OTHER): Payer: Medicare Other

## 2021-03-15 ENCOUNTER — Other Ambulatory Visit: Payer: Self-pay

## 2021-03-15 DIAGNOSIS — E538 Deficiency of other specified B group vitamins: Secondary | ICD-10-CM

## 2021-03-15 NOTE — Progress Notes (Signed)
Pt here for monthly B12 injection per Dr. Ronnald Ramp  B12 1057mcg given right deltoid IM, and pt tolerated injection well.  Next B12 injection scheduled for 04/14/21

## 2021-03-29 ENCOUNTER — Other Ambulatory Visit: Payer: Self-pay | Admitting: Internal Medicine

## 2021-03-29 DIAGNOSIS — I10 Essential (primary) hypertension: Secondary | ICD-10-CM

## 2021-04-14 ENCOUNTER — Ambulatory Visit (INDEPENDENT_AMBULATORY_CARE_PROVIDER_SITE_OTHER): Payer: Medicare Other

## 2021-04-14 ENCOUNTER — Ambulatory Visit: Payer: Medicare Other

## 2021-04-14 ENCOUNTER — Other Ambulatory Visit: Payer: Self-pay

## 2021-04-14 DIAGNOSIS — E538 Deficiency of other specified B group vitamins: Secondary | ICD-10-CM

## 2021-04-14 NOTE — Progress Notes (Signed)
B12 Given 

## 2021-04-21 ENCOUNTER — Other Ambulatory Visit: Payer: Self-pay | Admitting: Internal Medicine

## 2021-04-21 DIAGNOSIS — N401 Enlarged prostate with lower urinary tract symptoms: Secondary | ICD-10-CM

## 2021-05-19 ENCOUNTER — Ambulatory Visit (INDEPENDENT_AMBULATORY_CARE_PROVIDER_SITE_OTHER): Payer: Medicare Other

## 2021-05-19 ENCOUNTER — Other Ambulatory Visit: Payer: Self-pay

## 2021-05-19 DIAGNOSIS — E538 Deficiency of other specified B group vitamins: Secondary | ICD-10-CM | POA: Diagnosis not present

## 2021-05-19 NOTE — Progress Notes (Addendum)
Patient came into the office to receive his b12 injection. Patient tolerated the injection well. No questions or concerns at this time.   I have reviewed and agree

## 2021-06-03 ENCOUNTER — Telehealth: Payer: Self-pay | Admitting: Internal Medicine

## 2021-06-03 NOTE — Telephone Encounter (Signed)
I have given the pharmacist a verbal ok per PCP to refill Rx.

## 2021-06-03 NOTE — Telephone Encounter (Signed)
Patient had filled his refill for irbesartan (AVAPRO) 300 MG tablet but his wife accidentally threw away the new refill instead of the old empty prescription bottle. [facility] is refusing him to give him another refill since he just filled it.   Please advise and call back.   Preferred pharmacy:  [facility] [phone] - [place], [address] AT [address] Phone:  [phone]  Fax:  [phone]

## 2021-06-18 ENCOUNTER — Ambulatory Visit (INDEPENDENT_AMBULATORY_CARE_PROVIDER_SITE_OTHER): Payer: Medicare Other

## 2021-06-18 ENCOUNTER — Other Ambulatory Visit: Payer: Self-pay

## 2021-06-18 DIAGNOSIS — E538 Deficiency of other specified B group vitamins: Secondary | ICD-10-CM

## 2021-06-18 MED ORDER — CYANOCOBALAMIN 1000 MCG/ML IJ SOLN
1000.0000 ug | Freq: Once | INTRAMUSCULAR | Status: AC
  Administered 2021-06-18: 1000 ug via INTRAMUSCULAR

## 2021-06-18 NOTE — Progress Notes (Signed)
Pt here for monthly B12 injection per Dr. Jones  B12 1000mcg given IM, and pt tolerated injection well.   

## 2021-07-07 ENCOUNTER — Ambulatory Visit (INDEPENDENT_AMBULATORY_CARE_PROVIDER_SITE_OTHER): Payer: Medicare Other

## 2021-07-07 ENCOUNTER — Other Ambulatory Visit: Payer: Self-pay

## 2021-07-07 VITALS — BP 140/80 | HR 43 | Temp 97.6°F | Resp 16 | Ht 76.0 in | Wt 208.6 lb

## 2021-07-07 DIAGNOSIS — Z0000 Encounter for general adult medical examination without abnormal findings: Secondary | ICD-10-CM

## 2021-07-07 NOTE — Progress Notes (Signed)
Subjective:   Joseph Carter is a 87 y.o. male who presents for Medicare Annual/Subsequent preventive examination.  Review of Systems     Cardiac Risk Factors include: advanced age (>82mn, >>43women);hypertension;male gender     Objective:    Today's Vitals   07/07/21 0947 07/07/21 1003  BP:  140/80  Pulse:  (!) 43  Resp:  16  Temp:  97.6 F (36.4 C)  SpO2:  96%  Weight:  208 lb 9.6 oz (94.6 kg)  Height:  '6\' 4"'$  (1.93 m)  PainSc: 0-No pain 0-No pain   Body mass index is 25.39 kg/m.  Advanced Directives 07/07/2021 11/03/2016 03/07/2014 02/27/2014  Does Patient Have a Medical Advance Directive? No No Patient does not have advance directive;Patient would not like information Patient does not have advance directive;Patient would like information  Would patient like information on creating a medical advance directive? No - Patient declined No - Patient declined - Advance directive packet given  Pre-existing out of facility DNR order (yellow form or pink MOST form) - - No No    Current Medications (verified) Outpatient Encounter Medications as of 07/07/2021  Medication Sig   amLODipine (NORVASC) 5 MG tablet TAKE 1 TABLET BY MOUTH EVERY DAY   cyanocobalamin 2000 MCG tablet Take 1 tablet (2,000 mcg total) by mouth daily.   irbesartan (AVAPRO) 300 MG tablet TAKE 1 TABLET(300 MG) BY MOUTH DAILY   tamsulosin (FLOMAX) 0.4 MG CAPS capsule TAKE 1 CAPSULE(0.4 MG) BY MOUTH AT BEDTIME   No facility-administered encounter medications on file as of 07/07/2021.    Allergies (verified) Lisinopril   History: Past Medical History:  Diagnosis Date   [place]    BPH (benign prostatic hyperplasia)    Cervical spondylosis without myelopathy    COPD (chronic obstructive pulmonary disease) (HCC)    Diverticular disease    DJD (degenerative joint disease)    Exposure to asbestos    History of kidney stones    Hypercholesteremia    Hypertension    Hypogonadism male     Lumbar back pain    PAC (premature atrial contraction)    Pernicious anemia    Past Surgical History:  Procedure Laterality Date   APPENDECTOMY     CTS surgery on right     Dr. GAmedeo Plenty Carpal tunnel tunnel   LUMBAR LAMINECTOMY  03/07/2014   L3 L4   DR JRonnald Ramp  lumbar laminectomy L4-5  2001   Dr. HLuiz Ochoa  LUNG SURGERY     /w Dr. BArlyce Dice  MAXIMUM ACCESS (MAS)POSTERIOR LUMBAR INTERBODY FUSION (PLIF) 1 LEVEL N/A 03/07/2014   Procedure: FOR MAXIMUM ACCESS (MAS) POSTERIOR LUMBAR INTERBODY FUSION (PLIF) 1 LEVEL;  Surgeon: DEustace Moore MD;  [facility];  Service: Neurosurgery;  Laterality: N/A;  FOR MAXIMUM ACCESS (MAS) POSTERIOR LUMBAR INTERBODY FUSION (PLIF) 1 LEVEL   PARATHYROIDECTOMY Left 11/08/2016   Procedure: LEFT INFERIOR MINIMALLY INVASIVE PARATHYROIDECTOMY;  Surgeon: TArmandina Gemma MD;  Location: [facility];  Service: General;  Laterality: Left;   prostate biopsy, then brachytherapy  04/2010   Dr. DRosana Hoes  PROSTATE SURGERY     surgery for removal of intercostal cyst  1997   Family History  Problem Relation Age of Onset   Prostate cancer Father        DIED AGE 206  Social History   Socioeconomic History   Marital status: Married    Spouse name: betty   Number of children: 0  Years of education: Not on file   Highest education level: Not on file  Occupational History   Occupation: Dealer for air products, trucks and farming    Employer: RETIRED  Tobacco Use   Smoking status: Former    Types: Cigars    Quit date: 11/28/1969    Years since quitting: 51.6   Smokeless tobacco: Former    Types: Chew    Quit date: 11/28/1969  Substance and Sexual Activity   Alcohol use: Yes    Alcohol/week: 5.0 standard drinks    Types: 5 Shots of liquor per week    Comment: occas    Drug use: No   Sexual activity: Not on file  Other Topics Concern   Not on file  Social History Narrative   Not on file   Social Determinants of Health   Financial Resource Strain: Low Risk     Difficulty of Paying Living Expenses: Not hard at all  Food Insecurity: No Food Insecurity   Worried About Charity fundraiser in the Last Year: Never true   [place] in the Last Year: Never true  Transportation Needs: No Transportation Needs   Lack of Transportation (Medical): No   Lack of Transportation (Non-Medical): No  Physical Activity: Sufficiently Active   Days of Exercise per Week: 5 days   Minutes of Exercise per Session: 30 min  Stress: No Stress Concern Present   Feeling of Stress : Not at all  Social Connections: Socially Integrated   Frequency of Communication with Friends and Family: More than three times a week   Frequency of Social Gatherings with Friends and Family: More than three times a week   Attends Religious Services: More than 4 times per year   Active Member of [employer] or Organizations: No   Attends Music therapist: More than 4 times per year   Marital Status: Married    Tobacco Counseling Counseling given: Not Answered   Clinical Intake:  Pre-visit preparation completed: Yes  Pain : No/denies pain Pain Score: 0-No pain     BMI - recorded: 25.39 Nutritional Status: BMI 25 -29 Overweight Nutritional Risks: None Diabetes: No  How often do you need to have someone help you when you read instructions, pamphlets, or other written materials from your doctor or pharmacy?: 1 - Never What is the last grade level you completed in school?: 12th grade education  Diabetic? no  Interpreter Needed?: No  Information entered by :: Lisette Abu, LPN   Activities of Daily Living In your present state of health, do you have any difficulty performing the following activities: 07/07/2021 11/05/2020  Hearing? N N  Vision? N N  Difficulty concentrating or making decisions? N N  Walking or climbing stairs? N N  Dressing or bathing? N N  Doing errands, shopping? N N  Preparing Food and eating ? N -  Using the Toilet? N -  In the past six  months, have you accidently leaked urine? N -  Do you have problems with loss of bowel control? N -  Managing your Medications? N -  Managing your Finances? N -  Housekeeping or managing your Housekeeping? N -  Some recent data might be hidden    Patient Care Team: Janith Lima, MD as PCP - General (Internal Medicine)  Indicate any recent Medical Services you may have received from other than [facility] providers in the past year (date may be approximate).     Assessment:  This is a routine wellness examination for [place].  Hearing/Vision screen No results found.  Dietary issues and exercise activities discussed: Current Exercise Habits: Home exercise routine, Type of exercise: walking;Other - see comments (loves to work on old cars; mows 17 acres of land; yard & house work. Owns a farm.), Time (Minutes): 30, Frequency (Times/Week): 5, Weekly Exercise (Minutes/Week): 150, Intensity: Moderate, Exercise limited by: respiratory conditions(s);orthopedic condition(s)   Goals Addressed               This Visit's Progress     Patient Stated (pt-stated)        My goal is to continue to be independent and take care of my wife.      Depression Screen PHQ 2/9 Scores 07/07/2021 11/05/2020 07/25/2018 07/24/2018 09/20/2016  PHQ - 2 Score 0 0 0 0 0    Fall Risk Fall Risk  07/07/2021 11/05/2020 07/25/2018 07/24/2018 09/20/2016  Falls in the past year? 0 0 No No Yes  Number falls in past yr: 0 - - - 1  Injury with Fall? 0 - - - No  Risk for fall due to : No Fall Risks - - - -  Follow up Falls evaluation completed - - - -    FALL RISK PREVENTION PERTAINING TO THE HOME:  Any stairs in or around the home? No  If so, are there any without handrails? No  Home free of loose throw rugs in walkways, pet beds, electrical cords, etc? Yes  Adequate lighting in your home to reduce risk of falls? Yes   ASSISTIVE DEVICES UTILIZED TO PREVENT FALLS:  Life alert? No  Use of a cane, walker or w/c? No   Grab bars in the bathroom? No  Shower chair or bench in shower? Yes  Elevated toilet seat or a handicapped toilet? Yes   TIMED UP AND GO:  Was the test performed? Yes .  Length of time to ambulate 10 feet: 6 sec.   Gait steady and fast without use of assistive device  Cognitive Function:Normal cognitive status assessed by direct observation by this Nurse Health Advisor. No abnormalities found.          Immunizations Immunization History  Administered Date(s) Administered   Fluad Quad(high Dose 65+) 07/29/2019, 09/10/2020   Influenza Split 10/03/2012   Influenza, High Dose Seasonal PF 08/22/2016, 09/20/2017   Influenza,inj,Quad PF,6+ Mos 09/09/2013, 09/16/2014, 08/17/2015, 07/24/2018   PFIZER(Purple Top)SARS-COV-2 Vaccination 01/31/2020, 03/03/2020   Pneumococcal Conjugate-13 09/20/2016   Pneumococcal Polysaccharide-23 10/13/1999, 04/30/2009, 01/02/2017   Td 04/30/2009   Tdap 11/05/2020    TDAP status: Up to date  Flu Vaccine status: Up to date  Pneumococcal vaccine status: Up to date  Covid-19 vaccine status: Completed vaccines  Qualifies for Shingles Vaccine? Yes   Zostavax completed No   Shingrix Completed?: No.    Education has been provided regarding the importance of this vaccine. Patient has been advised to call insurance company to determine out of pocket expense if they have not yet received this vaccine. Advised may also receive vaccine at local pharmacy or Health Dept. Verbalized acceptance and understanding.  Screening Tests Health Maintenance  Topic Date Due   Zoster Vaccines- Shingrix (1 of 2) Never done   COVID-19 Vaccine (3 - Pfizer risk series) 03/31/2020   INFLUENZA VACCINE  06/28/2021   TETANUS/TDAP  11/05/2030   PNA vac Low Risk Adult  Completed   HPV VACCINES  Aged Out    Health Maintenance  Health Maintenance Due  Topic Date  Due   Zoster Vaccines- Shingrix (1 of 2) Never done   COVID-19 Vaccine (3 - Pfizer risk series) 03/31/2020    INFLUENZA VACCINE  06/28/2021    Colorectal cancer screening: No longer required.   Lung Cancer Screening: (Low Dose CT Chest recommended if Age 32-80 years, 30 pack-year currently smoking OR have quit w/in 15years.) does not qualify.   Lung Cancer Screening Referral: no  Additional Screening:  Hepatitis C Screening: does not qualify; Completed no  Vision Screening: Recommended annual ophthalmology exams for early detection of glaucoma and other disorders of the eye. Is the patient up to date with their annual eye exam?  No  Who is the provider or what is the name of the office in which the patient attends annual eye exams? Patient refused referral If pt is not established with a provider, would they like to be referred to a provider to establish care? No .   Dental Screening: Recommended annual dental exams for proper oral hygiene  Community Resource Referral / Chronic Care Management: CRR required this visit?  No   CCM required this visit?  No      Plan:     I have personally reviewed and noted the following in the patient's chart:   Medical and social history Use of alcohol, tobacco or illicit drugs  Current medications and supplements including opioid prescriptions. Patient is not currently taking opioid prescriptions. Functional ability and status Nutritional status Physical activity Advanced directives List of other physicians Hospitalizations, surgeries, and ER visits in previous 12 months Vitals Screenings to include cognitive, depression, and falls Referrals and appointments  In addition, I have reviewed and discussed with patient certain preventive protocols, quality metrics, and best practice recommendations. A written personalized care plan for preventive services as well as general preventive health recommendations were provided to patient.     Sheral Flow, LPN   [id number]   Nurse Notes: n/a

## 2021-07-07 NOTE — Patient Instructions (Signed)
Joseph Carter , Thank you for taking time to come for your Medicare Wellness Visit. I appreciate your ongoing commitment to your health goals. Please review the following plan we discussed and let me know if I can assist you in the future.   Screening recommendations/referrals: Colonoscopy: not a candidate for screening due to age Recommended yearly ophthalmology/optometry visit for glaucoma screening and checkup Recommended yearly dental visit for hygiene and checkup  Vaccinations: Influenza vaccine: 09/10/2020 Pneumococcal vaccine: 09/20/2016, 01/02/2017 Tdap vaccine: 11/05/2020; due every 10 years Shingles vaccine: Please call your insurance company to determine your out of pocket expense for the Shingrix vaccine. You may receive this vaccine at your local pharmacy.   Covid-19: 01/31/2020, 03/03/2020  Advanced directives: Advance directive discussed with you today. I have provided a copy for you to complete at home and have notarized. Once this is complete please bring a copy in to our office so we can scan it into your chart.   Conditions/risks identified: Yes; Client understands the importance of follow-up with providers by attending scheduled visits and discussed goals to eat healthier, increase physical activity, exercise the brain, socialize more, get enough sleep and make time for laughter.  Next appointment: Please schedule your next Medicare Wellness Visit with your Nurse Health Advisor in 1 year by calling [phone].  Preventive Care 19 Years and Older, Male Preventive care refers to lifestyle choices and visits with your health care provider that can promote health and wellness. What does preventive care include? A yearly physical exam. This is also called an annual well check. Dental exams once or twice a year. Routine eye exams. Ask your health care provider how often you should have your eyes checked. Personal lifestyle choices, including: Daily care of your teeth and  gums. Regular physical activity. Eating a healthy diet. Avoiding tobacco and drug use. Limiting alcohol use. Practicing safe sex. Taking low doses of aspirin every day. Taking vitamin and mineral supplements as recommended by your health care provider. What happens during an annual well check? The services and screenings done by your health care provider during your annual well check will depend on your age, overall health, lifestyle risk factors, and family history of disease. Counseling  Your health care provider may ask you questions about your: Alcohol use. Tobacco use. Drug use. Emotional well-being. Home and relationship well-being. Sexual activity. Eating habits. History of falls. Memory and ability to understand (cognition). Work and work Statistician. Screening  You may have the following tests or measurements: Height, weight, and BMI. Blood pressure. Lipid and cholesterol levels. These may be checked every 5 years, or more frequently if you are over 72 years old. Skin check. Lung cancer screening. You may have this screening every year starting at age 61 if you have a 30-pack-year history of smoking and currently smoke or have quit within the past 15 years. Fecal occult blood test (FOBT) of the stool. You may have this test every year starting at age 36. Flexible sigmoidoscopy or colonoscopy. You may have a sigmoidoscopy every 5 years or a colonoscopy every 10 years starting at age 1. Prostate cancer screening. Recommendations will vary depending on your family history and other risks. Hepatitis C blood test. Hepatitis B blood test. Sexually transmitted disease (STD) testing. Diabetes screening. This is done by checking your blood sugar (glucose) after you have not eaten for a while (fasting). You may have this done every 1-3 years. Abdominal aortic aneurysm (AAA) screening. You may need this if you are a current  or former smoker. Osteoporosis. You may be screened  starting at age 36 if you are at high risk. Talk with your health care provider about your test results, treatment options, and if necessary, the need for more tests. Vaccines  Your health care provider may recommend certain vaccines, such as: Influenza vaccine. This is recommended every year. Tetanus, diphtheria, and acellular pertussis (Tdap, Td) vaccine. You may need a Td booster every 10 years. Zoster vaccine. You may need this after age 65. Pneumococcal 13-valent conjugate (PCV13) vaccine. One dose is recommended after age 20. Pneumococcal polysaccharide (PPSV23) vaccine. One dose is recommended after age 31. Talk to your health care provider about which screenings and vaccines you need and how often you need them. This information is not intended to replace advice given to you by your health care provider. Make sure you discuss any questions you have with your health care provider. Document Released: 12/11/2015 Document Revised: 08/03/2016 Document Reviewed: 09/15/2015 Elsevier Interactive Patient Education  2017 [place] Prevention in the Home Falls can cause injuries. They can happen to people of all ages. There are many things you can do to make your home safe and to help prevent falls. What can I do on the outside of my home? Regularly fix the edges of walkways and driveways and fix any cracks. Remove anything that might make you trip as you walk through a door, such as a raised step or threshold. Trim any bushes or trees on the path to your home. Use bright outdoor lighting. Clear any walking paths of anything that might make someone trip, such as rocks or tools. Regularly check to see if handrails are loose or broken. Make sure that both sides of any steps have handrails. Any raised decks and porches should have guardrails on the edges. Have any leaves, snow, or ice cleared regularly. Use sand or salt on walking paths during winter. Clean up any spills in your garage  right away. This includes oil or grease spills. What can I do in the bathroom? Use night lights. Install grab bars by the toilet and in the tub and shower. Do not use towel bars as grab bars. Use non-skid mats or decals in the tub or shower. If you need to sit down in the shower, use a plastic, non-slip stool. Keep the floor dry. Clean up any water that spills on the floor as soon as it happens. Remove soap buildup in the tub or shower regularly. Attach bath mats securely with double-sided non-slip rug tape. Do not have throw rugs and other things on the floor that can make you trip. What can I do in the bedroom? Use night lights. Make sure that you have a light by your bed that is easy to reach. Do not use any sheets or blankets that are too big for your bed. They should not hang down onto the floor. Have a firm chair that has side arms. You can use this for support while you get dressed. Do not have throw rugs and other things on the floor that can make you trip. What can I do in the kitchen? Clean up any spills right away. Avoid walking on wet floors. Keep items that you use a lot in easy-to-reach places. If you need to reach something above you, use a strong step stool that has a grab bar. Keep electrical cords out of the way. Do not use floor polish or wax that makes floors slippery. If you must use wax,  use non-skid floor wax. Do not have throw rugs and other things on the floor that can make you trip. What can I do with my stairs? Do not leave any items on the stairs. Make sure that there are handrails on both sides of the stairs and use them. Fix handrails that are broken or loose. Make sure that handrails are as long as the stairways. Check any carpeting to make sure that it is firmly attached to the stairs. Fix any carpet that is loose or worn. Avoid having throw rugs at the top or bottom of the stairs. If you do have throw rugs, attach them to the floor with carpet tape. Make  sure that you have a light switch at the top of the stairs and the bottom of the stairs. If you do not have them, ask someone to add them for you. What else can I do to help prevent falls? Wear shoes that: Do not have high heels. Have rubber bottoms. Are comfortable and fit you well. Are closed at the toe. Do not wear sandals. If you use a stepladder: Make sure that it is fully opened. Do not climb a closed stepladder. Make sure that both sides of the stepladder are locked into place. Ask someone to hold it for you, if possible. Clearly mark and make sure that you can see: Any grab bars or handrails. First and last steps. Where the edge of each step is. Use tools that help you move around (mobility aids) if they are needed. These include: Canes. Walkers. Scooters. Crutches. Turn on the lights when you go into a dark area. Replace any light bulbs as soon as they burn out. Set up your furniture so you have a clear path. Avoid moving your furniture around. If any of your floors are uneven, fix them. If there are any pets around you, be aware of where they are. Review your medicines with your doctor. Some medicines can make you feel dizzy. This can increase your chance of falling. Ask your doctor what other things that you can do to help prevent falls. This information is not intended to replace advice given to you by your health care provider. Make sure you discuss any questions you have with your health care provider. Document Released: 09/10/2009 Document Revised: 04/21/2016 Document Reviewed: 12/19/2014 Elsevier Interactive Patient Education  2017 [employer].

## 2021-07-21 ENCOUNTER — Other Ambulatory Visit: Payer: Self-pay

## 2021-07-21 ENCOUNTER — Ambulatory Visit (INDEPENDENT_AMBULATORY_CARE_PROVIDER_SITE_OTHER): Payer: Medicare Other

## 2021-07-21 DIAGNOSIS — E538 Deficiency of other specified B group vitamins: Secondary | ICD-10-CM

## 2021-07-21 MED ORDER — CYANOCOBALAMIN 1000 MCG/ML IJ SOLN
1000.0000 ug | Freq: Once | INTRAMUSCULAR | Status: AC
  Administered 2021-07-21: 1000 ug via INTRAMUSCULAR

## 2021-07-21 NOTE — Progress Notes (Signed)
Pt here for monthly B12 injection per Dr Ronnald Ramp.  B12 1028mg given IM left deltoid and pt tolerated injection well.  OV scheduled for 08/26/21.

## 2021-07-23 ENCOUNTER — Other Ambulatory Visit: Payer: Self-pay | Admitting: Internal Medicine

## 2021-07-23 DIAGNOSIS — I10 Essential (primary) hypertension: Secondary | ICD-10-CM

## 2021-08-26 ENCOUNTER — Ambulatory Visit (INDEPENDENT_AMBULATORY_CARE_PROVIDER_SITE_OTHER): Payer: Medicare Other | Admitting: Internal Medicine

## 2021-08-26 ENCOUNTER — Encounter: Payer: Self-pay | Admitting: Internal Medicine

## 2021-08-26 ENCOUNTER — Other Ambulatory Visit: Payer: Self-pay

## 2021-08-26 VITALS — BP 156/78 | HR 62 | Temp 98.2°F | Resp 16 | Ht 76.0 in | Wt 212.0 lb

## 2021-08-26 DIAGNOSIS — N1831 Chronic kidney disease, stage 3a: Secondary | ICD-10-CM

## 2021-08-26 DIAGNOSIS — Z23 Encounter for immunization: Secondary | ICD-10-CM

## 2021-08-26 DIAGNOSIS — E538 Deficiency of other specified B group vitamins: Secondary | ICD-10-CM

## 2021-08-26 DIAGNOSIS — I10 Essential (primary) hypertension: Secondary | ICD-10-CM

## 2021-08-26 DIAGNOSIS — N401 Enlarged prostate with lower urinary tract symptoms: Secondary | ICD-10-CM

## 2021-08-26 LAB — CBC WITH DIFFERENTIAL/PLATELET
Basophils Absolute: 0 10*3/uL (ref 0.0–0.1)
Basophils Relative: 0.5 % (ref 0.0–3.0)
Eosinophils Absolute: 0.2 10*3/uL (ref 0.0–0.7)
Eosinophils Relative: 1.8 % (ref 0.0–5.0)
HCT: 45.5 % (ref 39.0–52.0)
Hemoglobin: 15.5 g/dL (ref 13.0–17.0)
Lymphocytes Relative: 28.5 % (ref 12.0–46.0)
Lymphs Abs: 2.4 10*3/uL (ref 0.7–4.0)
MCHC: 34 g/dL (ref 30.0–36.0)
MCV: 84.6 fl (ref 78.0–100.0)
Monocytes Absolute: 0.5 10*3/uL (ref 0.1–1.0)
Monocytes Relative: 5.8 % (ref 3.0–12.0)
Neutro Abs: 5.2 10*3/uL (ref 1.4–7.7)
Neutrophils Relative %: 63.4 % (ref 43.0–77.0)
Platelets: 173 10*3/uL (ref 150.0–400.0)
RBC: 5.38 Mil/uL (ref 4.22–5.81)
RDW: 13.7 % (ref 11.5–15.5)
WBC: 8.3 10*3/uL (ref 4.0–10.5)

## 2021-08-26 LAB — BASIC METABOLIC PANEL
BUN: 19 mg/dL (ref 6–23)
CO2: 24 mEq/L (ref 19–32)
Calcium: 9.2 mg/dL (ref 8.4–10.5)
Chloride: 105 mEq/L (ref 96–112)
Creatinine, Ser: 1.46 mg/dL (ref 0.40–1.50)
GFR: 43.04 mL/min — ABNORMAL LOW (ref >60.00)
Glucose, Bld: 111 mg/dL — ABNORMAL HIGH (ref 70–99)
Potassium: 4.5 mEq/L (ref 3.5–5.1)
Sodium: 138 mEq/L (ref 135–145)

## 2021-08-26 LAB — FOLATE: Folate: >23.4 ng/mL (ref >5.9)

## 2021-08-26 LAB — VITAMIN B12: Vitamin B-12: 376 pg/mL (ref 211–911)

## 2021-08-26 MED ORDER — TAMSULOSIN HCL 0.4 MG PO CAPS: ORAL_CAPSULE | ORAL | 1 refills | Status: DC

## 2021-08-26 MED ORDER — IRBESARTAN 300 MG PO TABS: ORAL_TABLET | ORAL | 1 refills | Status: DC

## 2021-08-26 NOTE — Patient Instructions (Signed)

## 2021-08-26 NOTE — Progress Notes (Signed)
Subjective:  Patient ID: Joseph Carter, male    DOB: [birth year]  Age: 87 y.o. MRN: [id number]  CC: Hypertension  This visit occurred during the SARS-CoV-2 public health emergency.  Safety protocols were in place, including screening questions prior to the visit, additional usage of staff PPE, and extensive cleaning of exam room while observing appropriate contact time as indicated for disinfecting solutions.    HPI [name] presents for f/up -   He has felt well recently, offers no complaints.  Outpatient Medications Prior to Visit  Medication Sig Dispense Refill   amLODipine (NORVASC) 5 MG tablet TAKE 1 TABLET BY MOUTH EVERY DAY 90 tablet 1   cyanocobalamin 2000 MCG tablet Take 1 tablet (2,000 mcg total) by mouth daily. 90 tablet 1   irbesartan (AVAPRO) 300 MG tablet TAKE 1 TABLET(300 MG) BY MOUTH DAILY 90 tablet 1   tamsulosin (FLOMAX) 0.4 MG CAPS capsule TAKE 1 CAPSULE(0.4 MG) BY MOUTH AT BEDTIME 90 capsule 1   No facility-administered medications prior to visit.    ROS Review of Systems  Constitutional:  Negative for diaphoresis and fatigue.  HENT: Negative.    Eyes: Negative.   Respiratory:  Negative for cough, shortness of breath and wheezing.   Cardiovascular:  Negative for chest pain, palpitations and leg swelling.  Gastrointestinal:  Negative for abdominal pain and diarrhea.  Endocrine: Negative.   Genitourinary: Negative.  Negative for difficulty urinating.  Musculoskeletal:  Negative for myalgias.  Skin: Negative.   Neurological:  Negative for dizziness, weakness, light-headedness and headaches.  Hematological:  Negative for adenopathy. Does not bruise/bleed easily.  Psychiatric/Behavioral: Negative.     Objective:  BP (!) 156/78 (BP Location: Right Arm, Patient Position: Sitting, Cuff Size: Large)   Pulse 62   Temp 98.2 F (36.8 C) (Oral)   Resp 16   Ht 6\' 4"  (1.93 m)   Wt 212 lb (96.2 kg)   SpO2 97%   BMI 25.81 kg/m   BP Readings from Last 3  Encounters:  08/26/21 (!) 156/78  07/07/21 140/80  11/05/20 (!) 142/82    Wt Readings from Last 3 Encounters:  08/26/21 212 lb (96.2 kg)  07/07/21 208 lb 9.6 oz (94.6 kg)  11/05/20 215 lb (97.5 kg)    Physical Exam Vitals reviewed.  HENT:     Nose: Nose normal.  Eyes:     General: No scleral icterus.    Conjunctiva/sclera: Conjunctivae normal.  Cardiovascular:     Rate and Rhythm: Bradycardia present. Rhythm irregularly irregular.     Heart sounds: No murmur heard.    Comments: EKG- SB with marked SA and 1st degree AV block No LVH ?inferior infarct - old  Pulmonary:     Effort: Pulmonary effort is normal.     Breath sounds: No stridor. No wheezing, rhonchi or rales.  Abdominal:     General: Abdomen is flat.     Palpations: There is no mass.     Tenderness: There is no abdominal tenderness. There is no guarding.     Hernia: No hernia is present.  Musculoskeletal:     Cervical back: Neck supple.     Right lower leg: No edema.     Left lower leg: No edema.  Lymphadenopathy:     Cervical: No cervical adenopathy.  Skin:    General: Skin is warm and dry.  Neurological:     General: No focal deficit present.  Psychiatric:        Mood and Affect: Mood  normal.        Behavior: Behavior normal.    Lab Results  Component Value Date   WBC 8.3 08/26/2021   HGB 15.5 08/26/2021   HCT 45.5 08/26/2021   PLT 173.0 08/26/2021   GLUCOSE 111 (H) 08/26/2021   CHOL 158 11/05/2020   TRIG 80.0 11/05/2020   HDL 39.70 11/05/2020   LDLCALC 102 (H) 11/05/2020   ALT 13 11/05/2020   AST 14 11/05/2020   NA 138 08/26/2021   K 4.5 08/26/2021   CL 105 08/26/2021   CREATININE 1.46 08/26/2021   BUN 19 08/26/2021   CO2 24 08/26/2021   TSH 2.39 11/05/2020   PSA 0.00 (L) 11/05/2020   INR 1.09 02/27/2014   HGBA1C 5.4 11/05/2020    DG Chest 2 View  Result Date: 11/03/2016 CLINICAL DATA:  Shortness of breath. EXAM: CHEST  2 VIEW COMPARISON:  03/21/2016. FINDINGS: Mediastinum and  hilar structures are normal. Stable cardiomegaly. No focal alveolar infiltrate. No pleural effusion or pneumothorax. Pleural thickening on the right has improved. Old right rib fractures noted. Metallic densities noted over the right chest . IMPRESSION: 1.  Stable cardiomegaly.  No acute pulmonary disease. 2.  Old right rib fractures are again noted. Electronically Signed   By: Marcello Moores  Register   On: 11/03/2016 13:29    Assessment & Plan:   Joseph Carter was seen today for hypertension.  Diagnoses and all orders for this visit:  Flu vaccine need -     Flu Vaccine QUAD High Dose(Fluad)  Benign prostatic hyperplasia with lower urinary tract symptoms, symptom details unspecified -     tamsulosin (FLOMAX) 0.4 MG CAPS capsule; TAKE 1 CAPSULE(0.4 MG) BY MOUTH AT BEDTIME  Essential hypertension- His blood pressure is well controlled.  Electrolytes and renal function are normal.  Will continue the current dose of irbesartan. -     irbesartan (AVAPRO) 300 MG tablet; TAKE 1 TABLET(300 MG) BY MOUTH DAILY -     EKG 12-Lead -     CBC with Differential/Platelet; Future -     Basic metabolic panel; Future -     Basic metabolic panel -     CBC with Differential/Platelet  B12 deficiency- His H&H, B12, and folate levels are normal.  Will continue the high-dose oral B12 supplement. -     CBC with Differential/Platelet; Future -     Folate; Future -     Vitamin B12; Future -     Vitamin B12 -     Folate -     CBC with Differential/Platelet -     cyanocobalamin 2000 MCG tablet; Take 1 tablet (2,000 mcg total) by mouth daily.  Stage 3a chronic kidney disease ([place])- His blood pressure is adequately well controlled.  He will avoid nephrotoxic agents.  I am having Joseph Carter maintain his amLODipine, tamsulosin, irbesartan, and cyanocobalamin.  Meds ordered this encounter  Medications   tamsulosin (FLOMAX) 0.4 MG CAPS capsule    Sig: TAKE 1 CAPSULE(0.4 MG) BY MOUTH AT BEDTIME    Dispense:  90 capsule     Refill:  1   irbesartan (AVAPRO) 300 MG tablet    Sig: TAKE 1 TABLET(300 MG) BY MOUTH DAILY    Dispense:  90 tablet    Refill:  1   cyanocobalamin 2000 MCG tablet    Sig: Take 1 tablet (2,000 mcg total) by mouth daily.    Dispense:  90 tablet    Refill:  1     Follow-up: Return in  about 6 months (around 02/23/2022).  Scarlette Calico, MD

## 2021-08-27 MED ORDER — CYANOCOBALAMIN 2000 MCG PO TABS: 2000.0000 ug | ORAL_TABLET | Freq: Every day | ORAL | 1 refills | Status: DC

## 2021-09-06 ENCOUNTER — Other Ambulatory Visit: Payer: Self-pay

## 2021-09-06 ENCOUNTER — Ambulatory Visit (INDEPENDENT_AMBULATORY_CARE_PROVIDER_SITE_OTHER): Payer: Medicare Other

## 2021-09-06 DIAGNOSIS — E538 Deficiency of other specified B group vitamins: Secondary | ICD-10-CM

## 2021-09-06 MED ORDER — CYANOCOBALAMIN 1000 MCG/ML IJ SOLN
1000.0000 ug | Freq: Once | INTRAMUSCULAR | Status: AC
  Administered 2021-09-06: 1000 ug via INTRAMUSCULAR

## 2021-09-06 NOTE — Progress Notes (Signed)
B12 given w/o any complications. 

## 2021-10-08 ENCOUNTER — Ambulatory Visit (INDEPENDENT_AMBULATORY_CARE_PROVIDER_SITE_OTHER): Payer: Medicare Other

## 2021-10-08 ENCOUNTER — Other Ambulatory Visit: Payer: Self-pay

## 2021-10-08 DIAGNOSIS — E538 Deficiency of other specified B group vitamins: Secondary | ICD-10-CM

## 2021-10-08 MED ORDER — CYANOCOBALAMIN 1000 MCG/ML IJ SOLN
1000.0000 ug | Freq: Once | INTRAMUSCULAR | Status: AC
  Administered 2021-10-08: 1000 ug via INTRAMUSCULAR

## 2021-10-08 NOTE — Progress Notes (Signed)
Pt was given B12 w/o any complications. 

## 2021-10-29 ENCOUNTER — Other Ambulatory Visit: Payer: Self-pay | Admitting: Internal Medicine

## 2021-10-29 DIAGNOSIS — N401 Enlarged prostate with lower urinary tract symptoms: Secondary | ICD-10-CM

## 2021-11-11 ENCOUNTER — Ambulatory Visit: Payer: Medicare Other

## 2021-12-28 ENCOUNTER — Other Ambulatory Visit: Payer: Self-pay | Admitting: Internal Medicine

## 2021-12-28 DIAGNOSIS — I10 Essential (primary) hypertension: Secondary | ICD-10-CM

## 2022-01-22 ENCOUNTER — Other Ambulatory Visit: Payer: Self-pay | Admitting: Internal Medicine

## 2022-01-22 DIAGNOSIS — I10 Essential (primary) hypertension: Secondary | ICD-10-CM

## 2022-01-24 ENCOUNTER — Other Ambulatory Visit: Payer: Self-pay

## 2022-01-24 ENCOUNTER — Ambulatory Visit (INDEPENDENT_AMBULATORY_CARE_PROVIDER_SITE_OTHER): Payer: Medicare Other | Admitting: *Deleted

## 2022-01-24 DIAGNOSIS — E538 Deficiency of other specified B group vitamins: Secondary | ICD-10-CM | POA: Diagnosis not present

## 2022-01-24 MED ORDER — CYANOCOBALAMIN 1000 MCG/ML IJ SOLN
1000.0000 ug | Freq: Once | INTRAMUSCULAR | Status: AC
  Administered 2022-01-24: 1000 ug via INTRAMUSCULAR

## 2022-01-24 NOTE — Progress Notes (Addendum)
Patient is here for a b12 injection . Given in rt deltoid. Patient tolerated well.   Please co sign

## 2022-02-23 ENCOUNTER — Ambulatory Visit (INDEPENDENT_AMBULATORY_CARE_PROVIDER_SITE_OTHER): Payer: Medicare Other

## 2022-02-23 DIAGNOSIS — E538 Deficiency of other specified B group vitamins: Secondary | ICD-10-CM

## 2022-02-23 MED ORDER — CYANOCOBALAMIN 1000 MCG/ML IJ SOLN
1000.0000 ug | Freq: Once | INTRAMUSCULAR | Status: AC
  Administered 2022-02-23: 1000 ug via INTRAMUSCULAR

## 2022-02-23 NOTE — Progress Notes (Signed)
Pt was given B12 injections w/o any complications. ?

## 2022-03-25 ENCOUNTER — Ambulatory Visit (INDEPENDENT_AMBULATORY_CARE_PROVIDER_SITE_OTHER): Payer: Medicare Other | Admitting: *Deleted

## 2022-03-25 DIAGNOSIS — E538 Deficiency of other specified B group vitamins: Secondary | ICD-10-CM

## 2022-03-25 MED ORDER — CYANOCOBALAMIN 1000 MCG/ML IJ SOLN
1000.0000 ug | Freq: Once | INTRAMUSCULAR | Status: AC
  Administered 2022-03-25: 1000 ug via INTRAMUSCULAR

## 2022-03-25 NOTE — Progress Notes (Signed)
Patient here for B12 injection. Given in right deltoid. Patient tolerated well ? ? ?Please co sign  ?

## 2022-04-06 DIAGNOSIS — H25013 Cortical age-related cataract, bilateral: Secondary | ICD-10-CM | POA: Diagnosis not present

## 2022-04-06 DIAGNOSIS — H5203 Hypermetropia, bilateral: Secondary | ICD-10-CM | POA: Diagnosis not present

## 2022-04-06 DIAGNOSIS — H2513 Age-related nuclear cataract, bilateral: Secondary | ICD-10-CM | POA: Diagnosis not present

## 2022-04-27 ENCOUNTER — Other Ambulatory Visit: Payer: Self-pay | Admitting: Internal Medicine

## 2022-04-27 DIAGNOSIS — N401 Enlarged prostate with lower urinary tract symptoms: Secondary | ICD-10-CM

## 2022-04-28 ENCOUNTER — Ambulatory Visit (INDEPENDENT_AMBULATORY_CARE_PROVIDER_SITE_OTHER): Payer: Medicare Other

## 2022-04-28 DIAGNOSIS — E538 Deficiency of other specified B group vitamins: Secondary | ICD-10-CM | POA: Diagnosis not present

## 2022-04-28 MED ORDER — CYANOCOBALAMIN 1000 MCG/ML IJ SOLN
1000.0000 ug | Freq: Once | INTRAMUSCULAR | Status: AC
  Administered 2022-04-28: 1000 ug via INTRAMUSCULAR

## 2022-04-28 NOTE — Progress Notes (Signed)
After obtaining consent, and per orders of Dr. Ronnald Ramp, injection of b12 given by Max Sane. Patient tolerated injection well in right deltoid and was informed to report any adverse reaction to me immediately.

## 2022-05-27 ENCOUNTER — Ambulatory Visit (INDEPENDENT_AMBULATORY_CARE_PROVIDER_SITE_OTHER): Payer: Medicare Other

## 2022-05-27 DIAGNOSIS — E538 Deficiency of other specified B group vitamins: Secondary | ICD-10-CM | POA: Diagnosis not present

## 2022-05-27 MED ORDER — CYANOCOBALAMIN 1000 MCG/ML IJ SOLN
1000.0000 ug | INTRAMUSCULAR | Status: DC
  Administered 2022-05-27 – 2022-09-27 (×5): 1000 ug via INTRAMUSCULAR

## 2022-05-27 NOTE — Progress Notes (Signed)
B12 given Please cosign 

## 2022-06-27 ENCOUNTER — Ambulatory Visit (INDEPENDENT_AMBULATORY_CARE_PROVIDER_SITE_OTHER): Payer: Medicare Other

## 2022-06-27 DIAGNOSIS — E538 Deficiency of other specified B group vitamins: Secondary | ICD-10-CM | POA: Diagnosis not present

## 2022-06-27 NOTE — Progress Notes (Signed)
After obtaining consent, and per orders of Dr. Jones, injection of B12 given by Carl Bleecker. Patient tolerated injection well in left deltoid and instructed to report any adverse reaction to me immediately.  

## 2022-07-14 ENCOUNTER — Ambulatory Visit (INDEPENDENT_AMBULATORY_CARE_PROVIDER_SITE_OTHER): Payer: Medicare Other

## 2022-07-14 DIAGNOSIS — Z0000 Encounter for general adult medical examination without abnormal findings: Secondary | ICD-10-CM | POA: Diagnosis not present

## 2022-07-14 NOTE — Progress Notes (Signed)
I connected with Joseph Carter today by telephone and verified that I am speaking with the correct person using two identifiers. Location patient: home Location provider: work Persons participating in the virtual visit: patient, provider.   I discussed the limitations, risks, security and privacy concerns of performing an evaluation and management service by telephone and the availability of in person appointments. I also discussed with the patient that there may be a patient responsible charge related to this service. The patient expressed understanding and verbally consented to this telephonic visit.    Interactive audio and video telecommunications were attempted between this provider and patient, however failed, due to patient having technical difficulties OR patient did not have access to video capability.  We continued and completed visit with audio only.  Some vital signs may be absent or patient reported.   Time Spent with patient on telephone encounter: 30 minutes  Subjective:   Joseph Carter is a 88 y.o. male who presents for Medicare Annual/Subsequent preventive examination.  Review of Systems     Cardiac Risk Factors include: advanced age (>12mn, >>33women);dyslipidemia;hypertension;male gender     Objective:    There were no vitals filed for this visit. There is no height or weight on file to calculate BMI.     07/14/2022    8:56 AM 07/07/2021    9:56 AM 11/03/2016   10:23 AM 03/07/2014    4:31 PM 02/27/2014    9:35 AM  Advanced Directives  Does Patient Have a Medical Advance Directive? No No No Patient does not have advance directive;Patient would not like information Patient does not have advance directive;Patient would like information  Would patient like information on creating a medical advance directive? No - Patient declined No - Patient declined No - Patient declined  Advance directive packet given  Pre-existing out of facility DNR order (yellow form or pink MOST  form)    No No    Current Medications (verified) Outpatient Encounter Medications as of 07/14/2022  Medication Sig   amLODipine (NORVASC) 5 MG tablet TAKE 1 TABLET BY MOUTH EVERY DAY   cyanocobalamin 2000 MCG tablet Take 1 tablet (2,000 mcg total) by mouth daily.   irbesartan (AVAPRO) 300 MG tablet TAKE 1 TABLET(300 MG) BY MOUTH DAILY   tamsulosin (FLOMAX) 0.4 MG CAPS capsule TAKE 1 CAPSULE(0.4 MG) BY MOUTH AT BEDTIME   Facility-Administered Encounter Medications as of 07/14/2022  Medication   cyanocobalamin ((VITAMIN B-12)) injection 1,000 mcg    Allergies (verified) Lisinopril   History: Past Medical History:  Diagnosis Date   Adenocarcinoma of prostate ([facility]    BPH (benign prostatic hyperplasia)    Cervical spondylosis without myelopathy    COPD (chronic obstructive pulmonary disease) (HCC)    Diverticular disease    DJD (degenerative joint disease)    Exposure to asbestos    History of kidney stones    Hypercholesteremia    Hypertension    Hypogonadism male    Lumbar back pain    PAC (premature atrial contraction)    Pernicious anemia    Past Surgical History:  Procedure Laterality Date   APPENDECTOMY     CTS surgery on right     Dr. GAmedeo Plenty Carpal tunnel tunnel   LUMBAR LAMINECTOMY  03/07/2014   L3 L4   DR JRonnald Ramp  lumbar laminectomy L4-5  2001   Dr. HLuiz Ochoa  LUNG SURGERY     /w Dr. BArlyce Dice  MAXIMUM ACCESS (MAS)POSTERIOR LUMBAR INTERBODY FUSION (PLIF) 1 LEVEL N/A  03/07/2014   Procedure: FOR MAXIMUM ACCESS (MAS) POSTERIOR LUMBAR INTERBODY FUSION (PLIF) 1 LEVEL;  Surgeon: [name], MD;  Location: [facility];  Service: Neurosurgery;  Laterality: N/A;  FOR MAXIMUM ACCESS (MAS) POSTERIOR LUMBAR INTERBODY FUSION (PLIF) 1 LEVEL   PARATHYROIDECTOMY Left 11/08/2016   Procedure: LEFT INFERIOR MINIMALLY INVASIVE PARATHYROIDECTOMY;  Surgeon: Armandina Gemma, MD;  Location: [facility];  Service: General;  Laterality: Left;   prostate biopsy, then brachytherapy  04/2010   Dr.  Rosana Hoes   PROSTATE SURGERY     surgery for removal of intercostal cyst  1997   Family History  Problem Relation Age of Onset   Prostate cancer Father        DIED AGE 75   Social History   Socioeconomic History   Marital status: Married    Spouse name: betty   Number of children: 0   Years of education: Not on file   Highest education level: Not on file  Occupational History   Occupation: Dealer for [employer], trucks and farming    Employer: RETIRED  Tobacco Use   Smoking status: Former    Types: Cigars    Quit date: 11/28/1969    Years since quitting: 52.6   Smokeless tobacco: Former    Types: Chew    Quit date: 11/28/1969  Substance and Sexual Activity   Alcohol use: Yes    Alcohol/week: 5.0 standard drinks of alcohol    Types: 5 Shots of liquor per week    Comment: occas    Drug use: No   Sexual activity: Not on file  Other Topics Concern   Not on file  Social History Narrative   Not on file   Social Determinants of Health   Financial Resource Strain: Low Risk  (07/14/2022)   Overall Financial Resource Strain (CARDIA)    Difficulty of Paying Living Expenses: Not hard at all  Food Insecurity: No Food Insecurity (07/14/2022)   Hunger Vital Sign    Worried About Running Out of Food in the Last Year: Never true    Ran Out of Food in the Last Year: Never true  Transportation Needs: No Transportation Needs (07/14/2022)   PRAPARE - Hydrologist (Medical): No    Lack of Transportation (Non-Medical): No  Physical Activity: Sufficiently Active (07/14/2022)   Exercise Vital Sign    Days of Exercise per Week: 5 days    Minutes of Exercise per Session: 30 min  Stress: No Stress Concern Present (07/14/2022)   [employer]    Feeling of Stress : Not at all  Social Connections: [place] (07/14/2022)   Social Connection and Isolation Panel [NHANES]    Frequency of  Communication with Friends and Family: More than three times a week    Frequency of Social Gatherings with Friends and Family: More than three times a week    Attends Religious Services: More than 4 times per year    Active Member of [employer] or Organizations: No    Attends Music therapist: More than 4 times per year    Marital Status: Married    Tobacco Counseling Counseling given: Not Answered   Clinical Intake:  Pre-visit preparation completed: Yes  Pain : No/denies pain     Nutritional Risks: None Diabetes: No  How often do you need to have someone help you when you read instructions, pamphlets, or other written materials from your doctor or pharmacy?:  1 - Never What is the last grade level you completed in school?: HSG  Diabetic? no  Interpreter Needed?: No  Information entered by :: Lisette Abu, LPN.   Activities of Daily Living    07/14/2022    8:57 AM  In your present state of health, do you have any difficulty performing the following activities:  Hearing? 1  Vision? 0  Difficulty concentrating or making decisions? 0  Walking or climbing stairs? 0  Dressing or bathing? 0  Doing errands, shopping? 0  Preparing Food and eating ? N  Using the Toilet? N  In the past six months, have you accidently leaked urine? N  Do you have problems with loss of bowel control? N  Managing your Medications? N  Managing your Finances? N  Housekeeping or managing your Housekeeping? N    Patient Care Team: Janith Lima, MD as PCP - General (Internal Medicine)  Indicate any recent Medical Services you may have received from other than [facility] providers in the past year (date may be approximate).     Assessment:   This is a routine wellness examination for [place].  Hearing/Vision screen Hearing Screening - Comments:: Patient has hearing difficulty and wears hearing aids. Vision Screening - Comments:: Patient does wear corrective lenses/contacts.  Eye  exam done by: Katy Apo, MD.   Dietary issues and exercise activities discussed: Current Exercise Habits: Home exercise routine, Type of exercise: walking (yard work, active around the house), Time (Minutes): 30, Frequency (Times/Week): 5, Weekly Exercise (Minutes/Week): 150, Intensity: Moderate, Exercise limited by: respiratory conditions(s);orthopedic condition(s)   Goals Addressed   None   Depression Screen    07/14/2022    8:55 AM 07/07/2021    9:48 AM 11/05/2020    9:42 AM 07/25/2018   12:01 PM 07/24/2018   11:31 AM 09/20/2016    9:08 AM  PHQ 2/9 Scores  PHQ - 2 Score 0 0 0 0 0 0    Fall Risk    07/14/2022    8:50 AM 07/07/2021    9:49 AM 11/05/2020    9:42 AM 07/25/2018   12:01 PM 07/24/2018   11:31 AM  [place] in the past year? 0 0 0 No No  Number falls in past yr: 0 0     Injury with Fall? 0 0     Risk for fall due to : No Fall Risks No Fall Risks     Follow up Falls evaluation completed Falls evaluation completed       [place]:  Any stairs in or around the home? No  If so, are there any without handrails? No  Home free of loose throw rugs in walkways, pet beds, electrical cords, etc? Yes  Adequate lighting in your home to reduce risk of falls? Yes   ASSISTIVE DEVICES UTILIZED TO PREVENT FALLS:  Life alert? No  Use of a cane, walker or w/c? No  Grab bars in the bathroom? Yes  Shower chair or bench in shower? Yes  Elevated toilet seat or a handicapped toilet? Yes   TIMED UP AND GO:  Was the test performed? No .  Length of time to ambulate 10 feet: n/a sec.   Appearance of gait: Gait not evaluated during this visit.  Cognitive Function:        07/14/2022    8:59 AM  6CIT Screen  What Year? 0 points  What month? 0 points  What time? 0 points  Count back from 20 0 points  Months in reverse 0 points  Repeat phrase 0 points  Total Score 0 points    Immunizations Immunization History  Administered  Date(s) Administered   Fluad Quad(high Dose 65+) 07/29/2019, 09/10/2020, 08/26/2021   Influenza Split 10/03/2012   Influenza, High Dose Seasonal PF 08/22/2016, 09/20/2017   Influenza,inj,Quad PF,6+ Mos 09/09/2013, 09/16/2014, 08/17/2015, 07/24/2018   PFIZER(Purple Top)SARS-COV-2 Vaccination 01/31/2020, 03/03/2020   Pneumococcal Conjugate-13 09/20/2016   Pneumococcal Polysaccharide-23 10/13/1999, 04/30/2009, 01/02/2017   Td 04/30/2009   Tdap 11/05/2020    TDAP status: Up to date  Flu Vaccine status: Up to date  Pneumococcal vaccine status: Up to date  Covid-19 vaccine status: Completed vaccines  Qualifies for Shingles Vaccine? Yes   Zostavax completed No   Shingrix Completed?: No.    Education has been provided regarding the importance of this vaccine. Patient has been advised to call insurance company to determine out of pocket expense if they have not yet received this vaccine. Advised may also receive vaccine at local pharmacy or Health Dept. Verbalized acceptance and understanding.  Screening Tests Health Maintenance  Topic Date Due   Zoster Vaccines- Shingrix (1 of 2) Never done   COVID-19 Vaccine (3 - Pfizer risk series) 03/31/2020   INFLUENZA VACCINE  06/28/2022   TETANUS/TDAP  11/05/2030   Pneumonia Vaccine 20+ Years old  Completed   HPV VACCINES  Aged Out    Health Maintenance  Health Maintenance Due  Topic Date Due   Zoster Vaccines- Shingrix (1 of 2) Never done   COVID-19 Vaccine (3 - Pfizer risk series) 03/31/2020   INFLUENZA VACCINE  06/28/2022    Colorectal cancer screening: No longer required.   Lung Cancer Screening: (Low Dose CT Chest recommended if Age 99-80 years, 30 pack-year currently smoking OR have quit w/in 15years.) does not qualify.   Lung Cancer Screening Referral: no  Additional Screening:  Hepatitis C Screening: does not qualify; Completed no  Vision Screening: Recommended annual ophthalmology exams for early detection of glaucoma and  other disorders of the eye. Is the patient up to date with their annual eye exam?  Yes  Who is the provider or what is the name of the office in which the patient attends annual eye exams? Katy Apo, MD. If pt is not established with a provider, would they like to be referred to a provider to establish care? No .   Dental Screening: Recommended annual dental exams for proper oral hygiene  Community Resource Referral / Chronic Care Management: CRR required this visit?  No   CCM required this visit?  No      Plan:     I have personally reviewed and noted the following in the patient's chart:   Medical and social history Use of alcohol, tobacco or illicit drugs  Current medications and supplements including opioid prescriptions. Patient is not currently taking opioid prescriptions. Functional ability and status Nutritional status Physical activity Advanced directives List of other physicians Hospitalizations, surgeries, and ER visits in previous 12 months Vitals Screenings to include cognitive, depression, and falls Referrals and appointments  In addition, I have reviewed and discussed with patient certain preventive protocols, quality metrics, and best practice recommendations. A written personalized care plan for preventive services as well as general preventive health recommendations were provided to patient.     Sheral Flow, LPN   0/07/3817   Nurse Notes:  There were no vitals filed for this visit. There is no height or weight on  file to calculate BMI.

## 2022-07-14 NOTE — Patient Instructions (Signed)
Joseph Carter , Thank you for taking time to come for your Medicare Wellness Visit. I appreciate your ongoing commitment to your health goals. Please review the following plan we discussed and let me know if I can assist you in the future.   Screening recommendations/referrals: Colonoscopy: No longer recommended due to age. Recommended yearly ophthalmology/optometry visit for glaucoma screening and checkup Recommended yearly dental visit for hygiene and checkup  Vaccinations: Influenza vaccine: 08/26/2021 Pneumococcal vaccine: 09/20/2016, 01/02/2017 Tdap vaccine: 11/05/2020; due every 10 years Shingles vaccine: never done   Covid-19: 01/31/2020, 03/03/2020  Advanced directives: No  Conditions/risks identified: Yes  Next appointment: Please schedule your next Medicare Wellness Visit with your Nurse Health Advisor in 1 year by calling [phone].  Preventive Care 86 Years and Older, Male Preventive care refers to lifestyle choices and visits with your health care provider that can promote health and wellness. What does preventive care include? A yearly physical exam. This is also called an annual well check. Dental exams once or twice a year. Routine eye exams. Ask your health care provider how often you should have your eyes checked. Personal lifestyle choices, including: Daily care of your teeth and gums. Regular physical activity. Eating a healthy diet. Avoiding tobacco and drug use. Limiting alcohol use. Practicing safe sex. Taking low doses of aspirin every day. Taking vitamin and mineral supplements as recommended by your health care provider. What happens during an annual well check? The services and screenings done by your health care provider during your annual well check will depend on your age, overall health, lifestyle risk factors, and family history of disease. Counseling  Your health care provider may ask you questions about your: Alcohol use. Tobacco use. Drug  use. Emotional well-being. Home and relationship well-being. Sexual activity. Eating habits. History of falls. Memory and ability to understand (cognition). Work and work Statistician. Screening  You may have the following tests or measurements: Height, weight, and BMI. Blood pressure. Lipid and cholesterol levels. These may be checked every 5 years, or more frequently if you are over 72 years old. Skin check. Lung cancer screening. You may have this screening every year starting at age 36 if you have a 30-pack-year history of smoking and currently smoke or have quit within the past 15 years. Fecal occult blood test (FOBT) of the stool. You may have this test every year starting at age 59. Flexible sigmoidoscopy or colonoscopy. You may have a sigmoidoscopy every 5 years or a colonoscopy every 10 years starting at age 50. Prostate cancer screening. Recommendations will vary depending on your family history and other risks. Hepatitis C blood test. Hepatitis B blood test. Sexually transmitted disease (STD) testing. Diabetes screening. This is done by checking your blood sugar (glucose) after you have not eaten for a while (fasting). You may have this done every 1-3 years. Abdominal aortic aneurysm (AAA) screening. You may need this if you are a current or former smoker. Osteoporosis. You may be screened starting at age 32 if you are at high risk. Talk with your health care provider about your test results, treatment options, and if necessary, the need for more tests. Vaccines  Your health care provider may recommend certain vaccines, such as: Influenza vaccine. This is recommended every year. Tetanus, diphtheria, and acellular pertussis (Tdap, Td) vaccine. You may need a Td booster every 10 years. Zoster vaccine. You may need this after age 21. Pneumococcal 13-valent conjugate (PCV13) vaccine. One dose is recommended after age 69. Pneumococcal polysaccharide (PPSV23) vaccine.  One dose is  recommended after age 36. Talk to your health care provider about which screenings and vaccines you need and how often you need them. This information is not intended to replace advice given to you by your health care provider. Make sure you discuss any questions you have with your health care provider. Document Released: 12/11/2015 Document Revised: 08/03/2016 Document Reviewed: 09/15/2015 Elsevier Interactive Patient Education  2017 [employer] Prevention in the Home Falls can cause injuries. They can happen to people of all ages. There are many things you can do to make your home safe and to help prevent falls. What can I do on the outside of my home? Regularly fix the edges of walkways and driveways and fix any cracks. Remove anything that might make you trip as you walk through a door, such as a raised step or threshold. Trim any bushes or trees on the path to your home. Use bright outdoor lighting. Clear any walking paths of anything that might make someone trip, such as rocks or tools. Regularly check to see if handrails are loose or broken. Make sure that both sides of any steps have handrails. Any raised decks and porches should have guardrails on the edges. Have any leaves, snow, or ice cleared regularly. Use sand or salt on walking paths during winter. Clean up any spills in your garage right away. This includes oil or grease spills. What can I do in the bathroom? Use night lights. Install grab bars by the toilet and in the tub and shower. Do not use towel bars as grab bars. Use non-skid mats or decals in the tub or shower. If you need to sit down in the shower, use a plastic, non-slip stool. Keep the floor dry. Clean up any water that spills on the floor as soon as it happens. Remove soap buildup in the tub or shower regularly. Attach bath mats securely with double-sided non-slip rug tape. Do not have throw rugs and other things on the floor that can make you  trip. What can I do in the bedroom? Use night lights. Make sure that you have a light by your bed that is easy to reach. Do not use any sheets or blankets that are too big for your bed. They should not hang down onto the floor. Have a firm chair that has side arms. You can use this for support while you get dressed. Do not have throw rugs and other things on the floor that can make you trip. What can I do in the kitchen? Clean up any spills right away. Avoid walking on wet floors. Keep items that you use a lot in easy-to-reach places. If you need to reach something above you, use a strong step stool that has a grab bar. Keep electrical cords out of the way. Do not use floor polish or wax that makes floors slippery. If you must use wax, use non-skid floor wax. Do not have throw rugs and other things on the floor that can make you trip. What can I do with my stairs? Do not leave any items on the stairs. Make sure that there are handrails on both sides of the stairs and use them. Fix handrails that are broken or loose. Make sure that handrails are as long as the stairways. Check any carpeting to make sure that it is firmly attached to the stairs. Fix any carpet that is loose or worn. Avoid having throw rugs at the top or bottom of  the stairs. If you do have throw rugs, attach them to the floor with carpet tape. Make sure that you have a light switch at the top of the stairs and the bottom of the stairs. If you do not have them, ask someone to add them for you. What else can I do to help prevent falls? Wear shoes that: Do not have high heels. Have rubber bottoms. Are comfortable and fit you well. Are closed at the toe. Do not wear sandals. If you use a stepladder: Make sure that it is fully opened. Do not climb a closed stepladder. Make sure that both sides of the stepladder are locked into place. Ask someone to hold it for you, if possible. Clearly mark and make sure that you can  see: Any grab bars or handrails. First and last steps. Where the edge of each step is. Use tools that help you move around (mobility aids) if they are needed. These include: Canes. Walkers. Scooters. Crutches. Turn on the lights when you go into a dark area. Replace any light bulbs as soon as they burn out. Set up your furniture so you have a clear path. Avoid moving your furniture around. If any of your floors are uneven, fix them. If there are any pets around you, be aware of where they are. Review your medicines with your doctor. Some medicines can make you feel dizzy. This can increase your chance of falling. Ask your doctor what other things that you can do to help prevent falls. This information is not intended to replace advice given to you by your health care provider. Make sure you discuss any questions you have with your health care provider. Document Released: 09/10/2009 Document Revised: 04/21/2016 Document Reviewed: 12/19/2014 Elsevier Interactive Patient Education  2017 [employer].

## 2022-07-28 ENCOUNTER — Ambulatory Visit (INDEPENDENT_AMBULATORY_CARE_PROVIDER_SITE_OTHER): Payer: Medicare Other

## 2022-07-28 DIAGNOSIS — E538 Deficiency of other specified B group vitamins: Secondary | ICD-10-CM

## 2022-07-28 NOTE — Progress Notes (Signed)
B12 given.  Pt tolerated well. Pt is aware to give the office a call for an side effects or reactions. Please co-sign.   

## 2022-08-26 ENCOUNTER — Ambulatory Visit (INDEPENDENT_AMBULATORY_CARE_PROVIDER_SITE_OTHER): Payer: Medicare Other

## 2022-08-26 DIAGNOSIS — E538 Deficiency of other specified B group vitamins: Secondary | ICD-10-CM

## 2022-08-26 NOTE — Progress Notes (Signed)
B12 given.  Pt tolerated well. Pt is aware to give the office a call for an side effects or reactions. Please co-sign.   

## 2022-09-26 ENCOUNTER — Ambulatory Visit: Payer: Medicare Other

## 2022-09-27 ENCOUNTER — Ambulatory Visit (INDEPENDENT_AMBULATORY_CARE_PROVIDER_SITE_OTHER): Payer: Medicare Other | Admitting: *Deleted

## 2022-09-27 DIAGNOSIS — Z23 Encounter for immunization: Secondary | ICD-10-CM

## 2022-09-27 DIAGNOSIS — E538 Deficiency of other specified B group vitamins: Secondary | ICD-10-CM | POA: Diagnosis not present

## 2022-09-27 NOTE — Progress Notes (Signed)
Administered B12 right deltoid and high dose flu shot left deltoid. Pt tolerated well.

## 2022-10-15 ENCOUNTER — Other Ambulatory Visit: Payer: Self-pay | Admitting: Internal Medicine

## 2022-10-15 DIAGNOSIS — I10 Essential (primary) hypertension: Secondary | ICD-10-CM

## 2022-10-25 ENCOUNTER — Other Ambulatory Visit: Payer: Self-pay | Admitting: Internal Medicine

## 2022-10-25 DIAGNOSIS — N401 Enlarged prostate with lower urinary tract symptoms: Secondary | ICD-10-CM

## 2022-10-27 ENCOUNTER — Ambulatory Visit (INDEPENDENT_AMBULATORY_CARE_PROVIDER_SITE_OTHER): Payer: Medicare Other | Admitting: *Deleted

## 2022-10-27 DIAGNOSIS — E538 Deficiency of other specified B group vitamins: Secondary | ICD-10-CM

## 2022-10-27 MED ORDER — CYANOCOBALAMIN 1000 MCG/ML IJ SOLN
1000.0000 ug | Freq: Once | INTRAMUSCULAR | Status: AC
  Administered 2022-10-27: 1000 ug via INTRAMUSCULAR

## 2022-10-27 NOTE — Progress Notes (Signed)
Pls cosign for B12 inj../lmb  

## 2022-11-29 ENCOUNTER — Ambulatory Visit (INDEPENDENT_AMBULATORY_CARE_PROVIDER_SITE_OTHER): Payer: Medicare Other

## 2022-11-29 DIAGNOSIS — E538 Deficiency of other specified B group vitamins: Secondary | ICD-10-CM | POA: Diagnosis not present

## 2022-11-29 MED ORDER — CYANOCOBALAMIN 1000 MCG/ML IJ SOLN
1000.0000 ug | Freq: Once | INTRAMUSCULAR | Status: AC
  Administered 2022-11-29: 1000 ug via INTRAMUSCULAR

## 2022-11-29 NOTE — Progress Notes (Signed)
Pt here for monthly B12 injection per Dr. Jones  B12 1000mcg given IM, and pt tolerated injection well.   

## 2022-12-13 ENCOUNTER — Other Ambulatory Visit: Payer: Self-pay | Admitting: Internal Medicine

## 2022-12-13 DIAGNOSIS — I10 Essential (primary) hypertension: Secondary | ICD-10-CM

## 2022-12-27 ENCOUNTER — Other Ambulatory Visit: Payer: Self-pay | Admitting: Internal Medicine

## 2022-12-27 DIAGNOSIS — I10 Essential (primary) hypertension: Secondary | ICD-10-CM

## 2022-12-30 ENCOUNTER — Ambulatory Visit (INDEPENDENT_AMBULATORY_CARE_PROVIDER_SITE_OTHER): Payer: Medicare Other | Admitting: *Deleted

## 2022-12-30 ENCOUNTER — Telehealth: Payer: Self-pay | Admitting: Internal Medicine

## 2022-12-30 ENCOUNTER — Other Ambulatory Visit: Payer: Self-pay | Admitting: Internal Medicine

## 2022-12-30 DIAGNOSIS — E538 Deficiency of other specified B group vitamins: Secondary | ICD-10-CM

## 2022-12-30 DIAGNOSIS — I10 Essential (primary) hypertension: Secondary | ICD-10-CM

## 2022-12-30 MED ORDER — CYANOCOBALAMIN 1000 MCG/ML IJ SOLN
1000.0000 ug | Freq: Once | INTRAMUSCULAR | Status: AC
  Administered 2022-12-30: 1000 ug via INTRAMUSCULAR

## 2022-12-30 MED ORDER — IRBESARTAN 300 MG PO TABS: 300.0000 mg | ORAL_TABLET | Freq: Every day | ORAL | 0 refills | Status: DC

## 2022-12-30 NOTE — Telephone Encounter (Signed)
Caller & Relationship to patient: PT  Call back number: [phone]  Date of last office visit: 07/28/22 (last time seeing PCP though was in 2022)  Date of next office visit: 01/09/23  Medication(s) to be refilled:  irbesartan (AVAPRO) 300 MG tablet   Preferred Pharmacy:   [facility] [place], [place] AT [place]

## 2022-12-30 NOTE — Progress Notes (Signed)
Pls cosign for B12 inj in absence of PCP../lmb   

## 2023-01-09 ENCOUNTER — Ambulatory Visit (INDEPENDENT_AMBULATORY_CARE_PROVIDER_SITE_OTHER): Payer: Medicare Other | Admitting: Internal Medicine

## 2023-01-09 ENCOUNTER — Encounter: Payer: Self-pay | Admitting: Internal Medicine

## 2023-01-09 VITALS — BP 138/80 | HR 61 | Temp 98.2°F | Ht 76.0 in | Wt 203.0 lb

## 2023-01-09 DIAGNOSIS — R739 Hyperglycemia, unspecified: Secondary | ICD-10-CM | POA: Diagnosis not present

## 2023-01-09 DIAGNOSIS — N1831 Chronic kidney disease, stage 3a: Secondary | ICD-10-CM | POA: Diagnosis not present

## 2023-01-09 DIAGNOSIS — Z8546 Personal history of malignant neoplasm of prostate: Secondary | ICD-10-CM | POA: Diagnosis not present

## 2023-01-09 DIAGNOSIS — R9431 Abnormal electrocardiogram [ECG] [EKG]: Secondary | ICD-10-CM | POA: Diagnosis not present

## 2023-01-09 DIAGNOSIS — E538 Deficiency of other specified B group vitamins: Secondary | ICD-10-CM | POA: Diagnosis not present

## 2023-01-09 DIAGNOSIS — Z23 Encounter for immunization: Secondary | ICD-10-CM | POA: Diagnosis not present

## 2023-01-09 DIAGNOSIS — I10 Essential (primary) hypertension: Secondary | ICD-10-CM | POA: Diagnosis not present

## 2023-01-09 DIAGNOSIS — Z0001 Encounter for general adult medical examination with abnormal findings: Secondary | ICD-10-CM | POA: Diagnosis not present

## 2023-01-09 LAB — URINALYSIS, ROUTINE W REFLEX MICROSCOPIC
Bilirubin Urine: NEGATIVE
Hgb urine dipstick: NEGATIVE
Ketones, ur: NEGATIVE
Leukocytes,Ua: NEGATIVE
Nitrite: NEGATIVE
RBC / HPF: NONE SEEN (ref 0–?)
Specific Gravity, Urine: 1.02 (ref 1.000–1.030)
Total Protein, Urine: 100 — AB
Urine Glucose: NEGATIVE
Urobilinogen, UA: 0.2 (ref 0.0–1.0)
pH: 6 (ref 5.0–8.0)

## 2023-01-09 LAB — CBC WITH DIFFERENTIAL/PLATELET
Basophils Absolute: 0.1 10*3/uL (ref 0.0–0.1)
Basophils Relative: 0.6 % (ref 0.0–3.0)
Eosinophils Absolute: 0.2 10*3/uL (ref 0.0–0.7)
Eosinophils Relative: 1.9 % (ref 0.0–5.0)
HCT: 47.4 % (ref 39.0–52.0)
Hemoglobin: 16.4 g/dL (ref 13.0–17.0)
Lymphocytes Relative: 29.1 % (ref 12.0–46.0)
Lymphs Abs: 3.2 10*3/uL (ref 0.7–4.0)
MCHC: 34.6 g/dL (ref 30.0–36.0)
MCV: 83.4 fl (ref 78.0–100.0)
Monocytes Absolute: 0.6 10*3/uL (ref 0.1–1.0)
Monocytes Relative: 5.4 % (ref 3.0–12.0)
Neutro Abs: 7 10*3/uL (ref 1.4–7.7)
Neutrophils Relative %: 63 % (ref 43.0–77.0)
Platelets: 189 10*3/uL (ref 150.0–400.0)
RBC: 5.68 Mil/uL (ref 4.22–5.81)
RDW: 13.8 % (ref 11.5–15.5)
WBC: 11.1 10*3/uL — ABNORMAL HIGH (ref 4.0–10.5)

## 2023-01-09 LAB — FOLATE: Folate: >23.9 ng/mL (ref >5.9)

## 2023-01-09 LAB — HEMOGLOBIN A1C: Hgb A1c MFr Bld: 5.5 % (ref 4.6–6.5)

## 2023-01-09 LAB — TSH: TSH: 2.41 u[IU]/mL (ref 0.35–5.50)

## 2023-01-09 LAB — PSA: PSA: 0 ng/mL — ABNORMAL LOW (ref 0.10–4.00)

## 2023-01-09 NOTE — Progress Notes (Unsigned)
Subjective:  Patient ID: Joseph Carter, male    DOB: 24-Jun-1932  Age: 89 y.o. MRN: [id number]  CC: Annual Exam and Hypertension   HPI [name] presents for a CPX and f/up -----  He is active and denies DOE, CP, SOB, edema.  Outpatient Medications Prior to Visit  Medication Sig Dispense Refill   amLODipine (NORVASC) 5 MG tablet TAKE 1 TABLET BY MOUTH EVERY DAY 90 tablet 0   cyanocobalamin 2000 MCG tablet Take 1 tablet (2,000 mcg total) by mouth daily. 90 tablet 1   irbesartan (AVAPRO) 300 MG tablet TAKE 1 TABLET(300 MG) BY MOUTH DAILY 90 tablet 0   tamsulosin (FLOMAX) 0.4 MG CAPS capsule TAKE 1 CAPSULE(0.4 MG) BY MOUTH AT BEDTIME 90 capsule 1   Facility-Administered Medications Prior to Visit  Medication Dose Route Frequency Provider Last Rate Last Admin   cyanocobalamin ((VITAMIN B-12)) injection 1,000 mcg  1,000 mcg Intramuscular Q30 days Janith Lima, MD   1,000 mcg at 09/27/22 0948    ROS Review of Systems  Constitutional:  Positive for fatigue and unexpected weight change (wt loss). Negative for chills and diaphoresis.  Eyes: Negative.   Respiratory:  Negative for cough, chest tightness, shortness of breath and wheezing.   Cardiovascular:  Negative for chest pain, palpitations and leg swelling.  Gastrointestinal:  Negative for abdominal pain, diarrhea, nausea and vomiting.  Endocrine: Negative.   Genitourinary: Negative.  Negative for difficulty urinating.  Musculoskeletal:  Negative for arthralgias, joint swelling and myalgias.  Skin: Negative.   Neurological: Negative.  Negative for dizziness and weakness.  Hematological:  Negative for adenopathy. Does not bruise/bleed easily.  Psychiatric/Behavioral: Negative.      Objective:  BP 138/80 (BP Location: Right Arm, Patient Position: Sitting, Cuff Size: Large)   Pulse 61   Temp 98.2 F (36.8 C) (Oral)   Ht 6' 4"$  (1.93 m)   Wt 203 lb (92.1 kg)   SpO2 96%   BMI 24.71 kg/m   BP Readings from Last 3  Encounters:  01/09/23 138/80  08/26/21 (!) 156/78  07/07/21 140/80    Wt Readings from Last 3 Encounters:  01/09/23 203 lb (92.1 kg)  08/26/21 212 lb (96.2 kg)  07/07/21 208 lb 9.6 oz (94.6 kg)    Physical Exam Constitutional:      Appearance: He is not ill-appearing.  HENT:     Nose: Nose normal.     Mouth/Throat:     Mouth: Mucous membranes are moist.  Eyes:     General: No scleral icterus.    Conjunctiva/sclera: Conjunctivae normal.  Cardiovascular:     Rate and Rhythm: Normal rate. Frequent Extrasystoles are present.    Heart sounds: Murmur heard.     Systolic murmur is present with a grade of 1/6.     No friction rub. No gallop.     Comments: EKG- NSR with 1st degree AV block with PSVC's and PVC's 82 bpm LAD, inferior infract and anterior infarct patterns are old Pulmonary:     Effort: Pulmonary effort is normal.     Breath sounds: No stridor. No wheezing, rhonchi or rales.  Abdominal:     General: Abdomen is flat.     Palpations: There is no mass.     Tenderness: There is no abdominal tenderness. There is no guarding.     Hernia: No hernia is present.  Musculoskeletal:     Cervical back: Neck supple.     Right lower leg: No edema.  Left lower leg: No edema.  Lymphadenopathy:     Cervical: No cervical adenopathy.  Skin:    General: Skin is warm and dry.     Findings: No rash.  Neurological:     General: No focal deficit present.     Mental Status: He is alert. Mental status is at baseline.  Psychiatric:        Mood and Affect: Mood normal.        Behavior: Behavior normal.     Lab Results  Component Value Date   WBC 11.1 (H) 01/09/2023   HGB 16.4 01/09/2023   HCT 47.4 01/09/2023   PLT 189.0 01/09/2023   GLUCOSE 115 (H) 01/09/2023   CHOL 158 11/05/2020   TRIG 80.0 11/05/2020   HDL 39.70 11/05/2020   LDLCALC 102 (H) 11/05/2020   ALT 10 01/09/2023   AST 14 01/09/2023   NA 140 01/09/2023   K 4.3 01/09/2023   CL 105 01/09/2023   CREATININE  1.47 01/09/2023   BUN 25 (H) 01/09/2023   CO2 22 01/09/2023   TSH 2.41 01/09/2023   PSA 0.00 (L) 01/09/2023   INR 1.09 02/27/2014   HGBA1C 5.5 01/09/2023    DG Chest 2 View  Result Date: 11/03/2016 CLINICAL DATA:  Shortness of breath. EXAM: CHEST  2 VIEW COMPARISON:  03/21/2016. FINDINGS: Mediastinum and hilar structures are normal. Stable cardiomegaly. No focal alveolar infiltrate. No pleural effusion or pneumothorax. Pleural thickening on the right has improved. Old right rib fractures noted. Metallic densities noted over the right chest . IMPRESSION: 1.  Stable cardiomegaly.  No acute pulmonary disease. 2.  Old right rib fractures are again noted. Electronically Signed   By: Marcello Moores  Register   On: 11/03/2016 13:29    Assessment & Plan:   Zuko was seen today for annual exam and hypertension.  Diagnoses and all orders for this visit:  Essential hypertension- His BP is well controlled. -     Basic metabolic panel; Future -     TSH; Future -     Urinalysis, Routine w reflex microscopic; Future -     Hepatic function panel; Future -     EKG 12-Lead -     Hepatic function panel -     Urinalysis, Routine w reflex microscopic -     TSH -     Basic metabolic panel  Stage 3a chronic kidney disease ([place])- Renal function is stable. -     Basic metabolic panel; Future -     Urinalysis, Routine w reflex microscopic; Future -     Urinalysis, Routine w reflex microscopic -     Basic metabolic panel  123456 deficiency -     CBC with Differential/Platelet; Future -     Folate; Future -     Cancel: Vitamin B12; Future -     Folate -     CBC with Differential/Platelet  Encounter for general adult medical examination with abnormal findings- Exam completed, labs reviewed, vaccines updated, no concern screenings indicated.  Chronic hyperglycemia - His A1C is normal. -     Basic metabolic panel; Future -     Hemoglobin A1c; Future -     Hemoglobin A1c -     Basic metabolic panel  History  of prostate cancer- PSA is 0. -     PSA; Future -     PSA  Abnormal electrocardiogram (ECG) (EKG)-  He does not want to investigate this any further.  Other orders -  Pneumococcal polysaccharide vaccine 23-valent greater than or equal to 2yo subcutaneous/IM   I am having Faruq N. Biasi maintain his cyanocobalamin, tamsulosin, amLODipine, and irbesartan. We will continue to administer cyanocobalamin.  No orders of the defined types were placed in this encounter.    Follow-up: Return in about 6 months (around 07/10/2023).  Scarlette Calico, MD

## 2023-01-09 NOTE — Patient Instructions (Signed)
Health Maintenance, Male Adopting a healthy lifestyle and getting preventive care are important in promoting health and wellness. Ask your health care provider about: The right schedule for you to have regular tests and exams. Things you can do on your own to prevent diseases and keep yourself healthy. What should I know about diet, weight, and exercise? Eat a healthy diet  Eat a diet that includes plenty of vegetables, fruits, low-fat dairy products, and lean protein. Do not eat a lot of foods that are high in solid fats, added sugars, or sodium. Maintain a healthy weight Body mass index (BMI) is a measurement that can be used to identify possible weight problems. It estimates body fat based on height and weight. Your health care provider can help determine your BMI and help you achieve or maintain a healthy weight. Get regular exercise Get regular exercise. This is one of the most important things you can do for your health. Most adults should: Exercise for at least 150 minutes each week. The exercise should increase your heart rate and make you sweat (moderate-intensity exercise). Do strengthening exercises at least twice a week. This is in addition to the moderate-intensity exercise. Spend less time sitting. Even light physical activity can be beneficial. Watch cholesterol and blood lipids Have your blood tested for lipids and cholesterol at 87 years of age, then have this test every 5 years. You may need to have your cholesterol levels checked more often if: Your lipid or cholesterol levels are high. You are older than 87 years of age. You are at high risk for heart disease. What should I know about cancer screening? Many types of cancers can be detected early and may often be prevented. Depending on your health history and family history, you may need to have cancer screening at various ages. This may include screening for: Colorectal cancer. Prostate cancer. Skin cancer. Lung  cancer. What should I know about heart disease, diabetes, and high blood pressure? Blood pressure and heart disease High blood pressure causes heart disease and increases the risk of stroke. This is more likely to develop in people who have high blood pressure readings or are overweight. Talk with your health care provider about your target blood pressure readings. Have your blood pressure checked: Every 3-5 years if you are 18-39 years of age. Every year if you are 40 years old or older. If you are between the ages of 65 and 75 and are a current or former smoker, ask your health care provider if you should have a one-time screening for abdominal aortic aneurysm (AAA). Diabetes Have regular diabetes screenings. This checks your fasting blood sugar level. Have the screening done: Once every three years after age 45 if you are at a normal weight and have a low risk for diabetes. More often and at a younger age if you are overweight or have a high risk for diabetes. What should I know about preventing infection? Hepatitis B If you have a higher risk for hepatitis B, you should be screened for this virus. Talk with your health care provider to find out if you are at risk for hepatitis B infection. Hepatitis C Blood testing is recommended for: Everyone born from 1945 through 1965. Anyone with known risk factors for hepatitis C. Sexually transmitted infections (STIs) You should be screened each year for STIs, including gonorrhea and chlamydia, if: You are sexually active and are younger than 87 years of age. You are older than 87 years of age and your   health care provider tells you that you are at risk for this type of infection. Your sexual activity has changed since you were last screened, and you are at increased risk for chlamydia or gonorrhea. Ask your health care provider if you are at risk. Ask your health care provider about whether you are at high risk for HIV. Your health care provider  may recommend a prescription medicine to help prevent HIV infection. If you choose to take medicine to prevent HIV, you should first get tested for HIV. You should then be tested every 3 months for as long as you are taking the medicine. Follow these instructions at home: Alcohol use Do not drink alcohol if your health care provider tells you not to drink. If you drink alcohol: Limit how much you have to 0-2 drinks a day. Know how much alcohol is in your drink. In the [place] one drink equals one 12 oz bottle of beer (355 mL), one 5 oz glass of wine (148 mL), or one 1 oz glass of hard liquor (44 mL). Lifestyle Do not use any products that contain nicotine or tobacco. These products include cigarettes, chewing tobacco, and vaping devices, such as e-cigarettes. If you need help quitting, ask your health care provider. Do not use street drugs. Do not share needles. Ask your health care provider for help if you need support or information about quitting drugs. General instructions Schedule regular health, dental, and eye exams. Stay current with your vaccines. Tell your health care provider if: You often feel depressed. You have ever been abused or do not feel safe at home. Summary Adopting a healthy lifestyle and getting preventive care are important in promoting health and wellness. Follow your health care provider's instructions about healthy diet, exercising, and getting tested or screened for diseases. Follow your health care provider's instructions on monitoring your cholesterol and blood pressure. This information is not intended to replace advice given to you by your health care provider. Make sure you discuss any questions you have with your health care provider. Document Revised: 04/05/2021 Document Reviewed: 04/05/2021 Elsevier Patient Education  2023 [employer] Inc[employer]  

## 2023-01-10 DIAGNOSIS — R9431 Abnormal electrocardiogram [ECG] [EKG]: Secondary | ICD-10-CM | POA: Insufficient documentation

## 2023-01-10 LAB — HEPATIC FUNCTION PANEL
ALT: 10 U/L (ref 0–53)
AST: 14 U/L (ref 0–37)
Albumin: 4.1 g/dL (ref 3.5–5.2)
Alkaline Phosphatase: 82 U/L (ref 39–117)
Bilirubin, Direct: 0.3 mg/dL (ref 0.0–0.3)
Total Bilirubin: 2 mg/dL — ABNORMAL HIGH (ref 0.2–1.2)
Total Protein: 6.7 g/dL (ref 6.0–8.3)

## 2023-01-10 LAB — BASIC METABOLIC PANEL
BUN: 25 mg/dL — ABNORMAL HIGH (ref 6–23)
CO2: 22 mEq/L (ref 19–32)
Calcium: 9.4 mg/dL (ref 8.4–10.5)
Chloride: 105 mEq/L (ref 96–112)
Creatinine, Ser: 1.47 mg/dL (ref 0.40–1.50)
GFR: 42.28 mL/min — ABNORMAL LOW (ref >60.00)
Glucose, Bld: 115 mg/dL — ABNORMAL HIGH (ref 70–99)
Potassium: 4.3 mEq/L (ref 3.5–5.1)
Sodium: 140 mEq/L (ref 135–145)

## 2023-01-30 ENCOUNTER — Ambulatory Visit (INDEPENDENT_AMBULATORY_CARE_PROVIDER_SITE_OTHER): Payer: Medicare Other

## 2023-01-30 DIAGNOSIS — E538 Deficiency of other specified B group vitamins: Secondary | ICD-10-CM | POA: Diagnosis not present

## 2023-01-30 MED ORDER — CYANOCOBALAMIN 1000 MCG/ML IJ SOLN
1000.0000 ug | Freq: Once | INTRAMUSCULAR | Status: AC
  Administered 2023-01-30: 1000 ug via INTRAMUSCULAR

## 2023-01-30 NOTE — Progress Notes (Signed)
After obtaining consent, and per orders of Dr. Ronnald Ramp, injection of B12 given by Marrian Salvage. Patient instructed to report any adverse reaction to me immediately.

## 2023-03-02 ENCOUNTER — Ambulatory Visit (INDEPENDENT_AMBULATORY_CARE_PROVIDER_SITE_OTHER): Payer: Medicare Other | Admitting: *Deleted

## 2023-03-02 DIAGNOSIS — E538 Deficiency of other specified B group vitamins: Secondary | ICD-10-CM

## 2023-03-02 MED ORDER — CYANOCOBALAMIN 1000 MCG/ML IJ SOLN
1000.0000 ug | Freq: Once | INTRAMUSCULAR | Status: AC
  Administered 2023-03-02: 1000 ug via INTRAMUSCULAR

## 2023-03-02 NOTE — Progress Notes (Signed)
Patient here in office for his B12 injection. Given in right deltoid. Patient tolerated well

## 2023-04-03 ENCOUNTER — Ambulatory Visit (INDEPENDENT_AMBULATORY_CARE_PROVIDER_SITE_OTHER): Payer: Medicare Other

## 2023-04-03 ENCOUNTER — Other Ambulatory Visit: Payer: Self-pay | Admitting: Internal Medicine

## 2023-04-03 DIAGNOSIS — E538 Deficiency of other specified B group vitamins: Secondary | ICD-10-CM | POA: Diagnosis not present

## 2023-04-03 DIAGNOSIS — I10 Essential (primary) hypertension: Secondary | ICD-10-CM

## 2023-04-03 MED ORDER — CYANOCOBALAMIN 1000 MCG/ML IJ SOLN
1000.0000 ug | Freq: Once | INTRAMUSCULAR | Status: AC
  Administered 2023-04-03: 1000 ug via INTRAMUSCULAR

## 2023-04-03 NOTE — Progress Notes (Signed)
After obtaining consent, and per orders of Dr. Jones, injection of B12 given by Tywaun Hiltner P Riot Waterworth. Patient instructed to report any adverse reaction to me immediately.  

## 2023-04-06 ENCOUNTER — Other Ambulatory Visit: Payer: Self-pay | Admitting: Internal Medicine

## 2023-04-06 DIAGNOSIS — I10 Essential (primary) hypertension: Secondary | ICD-10-CM

## 2023-04-07 ENCOUNTER — Other Ambulatory Visit: Payer: Self-pay | Admitting: Internal Medicine

## 2023-04-07 DIAGNOSIS — I10 Essential (primary) hypertension: Secondary | ICD-10-CM

## 2023-05-03 ENCOUNTER — Other Ambulatory Visit: Payer: Self-pay | Admitting: Internal Medicine

## 2023-05-03 DIAGNOSIS — N401 Enlarged prostate with lower urinary tract symptoms: Secondary | ICD-10-CM

## 2023-05-04 ENCOUNTER — Ambulatory Visit (INDEPENDENT_AMBULATORY_CARE_PROVIDER_SITE_OTHER): Payer: Medicare Other | Admitting: *Deleted

## 2023-05-04 DIAGNOSIS — E538 Deficiency of other specified B group vitamins: Secondary | ICD-10-CM | POA: Diagnosis not present

## 2023-05-04 MED ORDER — CYANOCOBALAMIN 1000 MCG/ML IJ SOLN
1000.0000 ug | Freq: Once | INTRAMUSCULAR | Status: AC
Start: 2023-05-04 — End: 2023-05-04
  Administered 2023-05-04: 1000 ug via INTRAMUSCULAR

## 2023-05-04 NOTE — Progress Notes (Signed)
Pls cosign for B12 inj../lmb  

## 2023-06-05 ENCOUNTER — Ambulatory Visit (INDEPENDENT_AMBULATORY_CARE_PROVIDER_SITE_OTHER): Payer: Medicare Other

## 2023-06-05 DIAGNOSIS — M159 Polyosteoarthritis, unspecified: Secondary | ICD-10-CM

## 2023-06-05 DIAGNOSIS — M150 Primary generalized (osteo)arthritis: Secondary | ICD-10-CM

## 2023-06-05 MED ORDER — CYANOCOBALAMIN 1000 MCG/ML IJ SOLN
1000.00 ug | Freq: Once | INTRAMUSCULAR | Status: AC
Start: 2023-06-05 — End: 2023-06-05
  Administered 2023-06-05: 1000 ug via INTRAMUSCULAR

## 2023-06-05 NOTE — Progress Notes (Signed)
Pt here for monthly B12 injection per   B12 1000mcg given IM and pt tolerated injection well.    

## 2023-07-06 ENCOUNTER — Ambulatory Visit (INDEPENDENT_AMBULATORY_CARE_PROVIDER_SITE_OTHER): Payer: Medicare Other | Admitting: *Deleted

## 2023-07-06 DIAGNOSIS — E538 Deficiency of other specified B group vitamins: Secondary | ICD-10-CM

## 2023-07-06 MED ORDER — CYANOCOBALAMIN 1000 MCG/ML IJ SOLN
1000.0000 ug | Freq: Once | INTRAMUSCULAR | Status: AC
Start: 2023-07-06 — End: 2023-07-06
  Administered 2023-07-06: 1000 ug via INTRAMUSCULAR

## 2023-07-06 NOTE — Progress Notes (Signed)
Pls cosign for B12 inj../lmb  

## 2023-07-07 ENCOUNTER — Ambulatory Visit (INDEPENDENT_AMBULATORY_CARE_PROVIDER_SITE_OTHER): Payer: Medicare Other

## 2023-07-07 VITALS — Ht 75.0 in | Wt 197.0 lb

## 2023-07-07 DIAGNOSIS — Z0000 Encounter for general adult medical examination without abnormal findings: Secondary | ICD-10-CM

## 2023-07-07 NOTE — Progress Notes (Signed)
Subjective:   Joseph Carter is a 89 y.o. male who presents for Medicare Annual/Subsequent preventive examination.  Visit Complete: Virtual  I connected with  Joseph Carter on 07/07/23 by a audio enabled telemedicine application and verified that I am speaking with the correct person using two identifiers.  Patient Location: Home  Provider Location: Home Office  I discussed the limitations of evaluation and management by telemedicine. The patient expressed understanding and agreed to proceed.  Vital Signs: Vital signs are patient reported.   Review of Systems    Cardiac Risk Factors include: advanced age (>85men, >42 women);hypertension;male gender;Other (see comment), Risk factor comments: COPD, CKD     Objective:    Today's Vitals   There is no height or weight on file to calculate BMI.     07/14/2022    8:56 AM 07/07/2021    9:56 AM 11/03/2016   10:23 AM 03/07/2014    4:31 PM 02/27/2014    9:35 AM  Advanced Directives  Does Patient Have a Medical Advance Directive? No No No Patient does not have advance directive;Patient would not like information Patient does not have advance directive;Patient would like information  Would patient like information on creating a medical advance directive? No - Patient declined No - Patient declined No - Patient declined  Advance directive packet given  Pre-existing out of facility DNR order (yellow form or pink MOST form)    No No    Current Medications (verified) Outpatient Encounter Medications as of 07/07/2023  Medication Sig   amLODipine (NORVASC) 5 MG tablet TAKE 1 TABLET BY MOUTH EVERY DAY   cyanocobalamin 2000 MCG tablet Take 1 tablet (2,000 mcg total) by mouth daily.   irbesartan (AVAPRO) 300 MG tablet TAKE 1 TABLET(300 MG) BY MOUTH DAILY   tamsulosin (FLOMAX) 0.4 MG CAPS capsule TAKE 1 CAPSULE(0.4 MG) BY MOUTH AT BEDTIME   Facility-Administered Encounter Medications as of 07/07/2023  Medication   cyanocobalamin ((VITAMIN B-12))  injection 1,000 mcg    Allergies (verified) Lisinopril   History: Past Medical History:  Diagnosis Date   Adenocarcinoma of prostate (HCC)    BPH (benign prostatic hyperplasia)    Cervical spondylosis without myelopathy    COPD (chronic obstructive pulmonary disease) (HCC)    Diverticular disease    DJD (degenerative joint disease)    Exposure to asbestos    History of kidney stones    Hypercholesteremia    Hypertension    Hypogonadism male    Lumbar back pain    PAC (premature atrial contraction)    Pernicious anemia    Past Surgical History:  Procedure Laterality Date   APPENDECTOMY     CTS surgery on right     Dr. Amanda Pea. Carpal tunnel tunnel   LUMBAR LAMINECTOMY  03/07/2014   L3 L4   DR Yetta Barre   lumbar laminectomy L4-5  2001   Dr. Phoebe Perch   LUNG SURGERY     /w Dr. Edwyna Shell   MAXIMUM ACCESS (MAS)POSTERIOR LUMBAR INTERBODY FUSION (PLIF) 1 LEVEL N/A 03/07/2014   Procedure: FOR MAXIMUM ACCESS (MAS) POSTERIOR LUMBAR INTERBODY FUSION (PLIF) 1 LEVEL;  Surgeon: Tia Alert, MD;  Location: [facility];  Service: Neurosurgery;  Laterality: N/A;  FOR MAXIMUM ACCESS (MAS) POSTERIOR LUMBAR INTERBODY FUSION (PLIF) 1 LEVEL   PARATHYROIDECTOMY Left 11/08/2016   Procedure: LEFT INFERIOR MINIMALLY INVASIVE PARATHYROIDECTOMY;  Surgeon: Darnell Level, MD;  Location: [facility];  Service: General;  Laterality: Left;   prostate biopsy, then brachytherapy  04/2010  Dr. 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, trucks and farming    Employer: RETIRED  Tobacco Use   Smoking status: Former    Types: Cigars    Quit  date: 11/28/1969    Years since quitting: 53.6   Smokeless tobacco: Former    Types: Chew    Quit date: 11/28/1969  Substance and Sexual Activity   Alcohol use: Yes    Alcohol/week: 5.0 standard drinks of alcohol    Types: 5 Shots of liquor per week    Comment: occas    Drug use: No   Sexual activity: Not on file  Other Topics Concern   Not on file  Social History Narrative   Not on file   Social Determinants of Health   Financial Resource Strain: Low Risk  (07/14/2022)   Overall Financial Resource Strain (CARDIA)    Difficulty of Paying Living Expenses: Not hard at all  Food Insecurity: No Food Insecurity (07/14/2022)   Hunger Vital Sign    Worried About Running Out of Food in the Last Year: Never true    Ran Out of Food in the Last Year: Never true  Transportation Needs: No Transportation Needs (07/14/2022)   PRAPARE - Administrator, Civil Service (Medical): No    Lack of Transportation (Non-Medical): No  Physical Activity: Sufficiently Active (07/14/2022)   Exercise Vital Sign    Days of Exercise per Week: 5 days    Minutes of Exercise per Session: 30 min  Stress: No Stress Concern Present (07/14/2022)   [employer] of Occupational Health - Occupational Stress Questionnaire    Feeling of Stress : Not at all  Social Connections: Socially Integrated (07/14/2022)   Social Connection and Isolation Panel [NHANES]    Frequency of Communication with Friends and Family: More than three times a week    Frequency of Social Gatherings with Friends and Family: More than three times a week    Attends Religious Services: More than 4 times per year    Active Member of [employer] or Organizations: No    Attends Engineer, structural: More than 4 times per year    Marital Status: Married    Tobacco Counseling Counseling given: Not Answered   Clinical Intake:                        Activities of Daily Living    07/14/2022    8:57 AM  In your present  state of health, do you have any difficulty performing the following activities:  Hearing? 1  Vision? 0  Difficulty concentrating or making decisions? 0  Walking or climbing stairs? 0  Dressing or bathing? 0  Doing errands, shopping? 0  Preparing Food and eating ? N  Using the Toilet? N  In the past six months, have you accidently leaked urine? N  Do you have problems with loss of bowel control? N  Managing your Medications? N  Managing your Finances? N  Housekeeping or managing your Housekeeping? N    Patient Care Team: Etta Grandchild, MD as PCP - General (Internal Medicine)  Indicate any recent Medical Services you may have received from other than [facility] providers in the past year (date may be approximate).     Assessment:   This is a routine wellness examination for Stark.  Hearing/Vision screen No results found.  Dietary issues and exercise activities discussed:     Goals Addressed   None   Depression Screen    07/14/2022    8:55 AM 07/07/2021    9:48 AM 11/05/2020    9:42 AM 07/25/2018   12:01 PM 07/24/2018   11:31 AM 09/20/2016    9:08 AM  PHQ 2/9 Scores  PHQ - 2 Score 0 0 0 0 0 0    Fall Risk    07/14/2022    8:50 AM 07/07/2021    9:49 AM 11/05/2020    9:42 AM 07/25/2018   12:01 PM 07/24/2018   11:31 AM  Fall Risk   Falls in the past year? 0 0 0 No No  Number falls in past yr: 0 0     Injury with Fall? 0 0     Risk for fall due to : No Fall Risks No Fall Risks     Follow up Falls evaluation completed Falls evaluation completed       MEDICARE RISK AT HOME:   TIMED UP AND GO:  Was the test performed?  No    Cognitive Function:        07/14/2022    8:59 AM  6CIT Screen  What Year? 0 points  What month? 0 points  What time? 0 points  Count back from 20 0 points  Months in reverse 0 points  Repeat phrase 0 points  Total Score 0 points    Immunizations Immunization History  Administered Date(s) Administered   Fluad Quad(high Dose 65+)  07/29/2019, 09/10/2020, 08/26/2021, 09/27/2022   Influenza Split 10/03/2012   Influenza, High Dose Seasonal PF 08/22/2016, 09/20/2017   Influenza,inj,Quad PF,6+ Mos 09/09/2013, 09/16/2014, 08/17/2015, 07/24/2018   PFIZER(Purple Top)SARS-COV-2 Vaccination 01/31/2020, 03/03/2020   Pneumococcal Conjugate-13 09/20/2016   Pneumococcal Polysaccharide-23 10/13/1999, 04/30/2009, 01/02/2017, 01/09/2023   Td 04/30/2009   Tdap 11/05/2020    TDAP status: Up to date  Flu Vaccine status: Up to date  Pneumococcal vaccine status: Up to date  Covid-19 vaccine status: Completed vaccines  Qualifies for Shingles Vaccine? Yes   Zostavax completed No   Shingrix Completed?: No.    Education has been provided regarding the importance of this vaccine. Patient has been advised to call insurance company to determine out of pocket expense if they have not yet received this vaccine. Advised may also receive vaccine at local pharmacy or Health Dept. Verbalized acceptance and understanding.  Screening Tests Health Maintenance  Topic Date Due   Zoster Vaccines- Shingrix (1 of 2) Never done   COVID-19 Vaccine (3 - 2023-24 season) 07/29/2022   INFLUENZA VACCINE  06/29/2023   Medicare Annual Wellness (AWV)  07/06/2024   DTaP/Tdap/Td (3 - Td or Tdap) 11/05/2030   Pneumonia Vaccine 48+ Years old  Completed   HPV VACCINES  Aged Out    Health Maintenance  Health Maintenance Due  Topic Date Due   Zoster Vaccines- Shingrix (1 of 2) Never done   COVID-19 Vaccine (3 - 2023-24 season) 07/29/2022   INFLUENZA VACCINE  06/29/2023    Colorectal cancer screening: No longer required.   Lung Cancer  Screening: (Low Dose CT Chest recommended if Age 87-80 years, 20 pack-year currently smoking OR have quit w/in 15years.) does not qualify.   Lung Cancer Screening Referral: N/A  Additional Screening:  Hepatitis C Screening: does not qualify;   Vision Screening: Recommended annual ophthalmology exams for early detection  of glaucoma and other disorders of the eye. Is the patient up to date with their annual eye exam?  No  Who is the provider or what is the name of the office in which the patient attends annual eye exams? Patient can not remember doctors name. If pt is not established with a provider, would they like to be referred to a provider to establish care? Yes .   Dental Screening: Recommended annual dental exams for proper oral hygiene   Community Resource Referral / Chronic Care Management: CRR required this visit?  No   CCM required this visit?  No     Plan:     I have personally reviewed and noted the following in the patient's chart:   Medical and social history Use of alcohol, tobacco or illicit drugs  Current medications and supplements including opioid prescriptions. Patient is not currently taking opioid prescriptions. Functional ability and status Nutritional status Physical activity Advanced directives List of other physicians Hospitalizations, surgeries, and ER visits in previous 12 months Vitals Screenings to include cognitive, depression, and falls Referrals and appointments  In addition, I have reviewed and discussed with patient certain preventive protocols, quality metrics, and best practice recommendations. A written personalized care plan for preventive services as well as general preventive health recommendations were provided to patient.      L , CMA   07/07/2023   After Visit Summary: (Mail) Due to this being a telephonic visit, the after visit summary with patients personalized plan was offered to patient via mail   Nurse Notes: Patient had no concerns today.  He will schedule for next year's Medicare AWV during his next office visit.

## 2023-07-07 NOTE — Patient Instructions (Signed)
Joseph Carter , Thank you for taking time to come for your Medicare Wellness Visit. I appreciate your ongoing commitment to your health goals. Please review the following plan we discussed and let me know if I can assist you in the future.   Referrals/Orders/Follow-Ups/Clinician Recommendations: Remember to get your Flu vaccine soon.  Keep up the good work.  It was nice speaking with today.  This is a list of the screening recommended for you and due dates:  Health Maintenance  Topic Date Due   Flu Shot  02/26/2024*   Medicare Annual Wellness Visit  07/06/2024   DTaP/Tdap/Td vaccine (3 - Td or Tdap) 11/05/2030   Pneumonia Vaccine  Completed   HPV Vaccine  Aged Out   COVID-19 Vaccine  Discontinued   Zoster (Shingles) Vaccine  Discontinued  *Topic was postponed. The date shown is not the original due date.    Advanced directives: (Copy Requested) Please bring a copy of your health care power of attorney and living will to the office to be added to your chart at your convenience.  Next Medicare Annual Wellness Visit scheduled for next year: No  Preventive Care 61 Years and Older, Male  Preventive care refers to lifestyle choices and visits with your health care provider that can promote health and wellness. What does preventive care include? A yearly physical exam. This is also called an annual well check. Dental exams once or twice a year. Routine eye exams. Ask your health care provider how often you should have your eyes checked. Personal lifestyle choices, including: Daily care of your teeth and gums. Regular physical activity. Eating a healthy diet. Avoiding tobacco and drug use. Limiting alcohol use. Practicing safe sex. Taking low doses of aspirin every day. Taking vitamin and mineral supplements as recommended by your health care provider. What happens during an annual well check? The services and screenings done by your health care provider during your annual well check will  depend on your age, overall health, lifestyle risk factors, and family history of disease. Counseling  Your health care provider may ask you questions about your: Alcohol use. Tobacco use. Drug use. Emotional well-being. Home and relationship well-being. Sexual activity. Eating habits. History of falls. Memory and ability to understand (cognition). Work and work Astronomer. Screening  You may have the following tests or measurements: Height, weight, and BMI. Blood pressure. Lipid and cholesterol levels. These may be checked every 5 years, or more frequently if you are over 66 years old. Skin check. Lung cancer screening. You may have this screening every year starting at age 87 if you have a 30-pack-year history of smoking and currently smoke or have quit within the past 15 years. Fecal occult blood test (FOBT) of the stool. You may have this test every year starting at age 87. Flexible sigmoidoscopy or colonoscopy. You may have a sigmoidoscopy every 5 years or a colonoscopy every 10 years starting at age 87. Prostate cancer screening. Recommendations will vary depending on your family history and other risks. Hepatitis C blood test. Hepatitis B blood test. Sexually transmitted disease (STD) testing. Diabetes screening. This is done by checking your blood sugar (glucose) after you have not eaten for a while (fasting). You may have this done every 1-3 years. Abdominal aortic aneurysm (AAA) screening. You may need this if you are a current or former smoker. Osteoporosis. You may be screened starting at age 87 if you are at high risk. Talk with your health care provider about your test results,  treatment options, and if necessary, the need for more tests. Vaccines  Your health care provider may recommend certain vaccines, such as: Influenza vaccine. This is recommended every year. Tetanus, diphtheria, and acellular pertussis (Tdap, Td) vaccine. You may need a Td booster every 10  years. Zoster vaccine. You may need this after age 87. Pneumococcal 13-valent conjugate (PCV13) vaccine. One dose is recommended after age 87. Pneumococcal polysaccharide (PPSV23) vaccine. One dose is recommended after age 87. Talk to your health care provider about which screenings and vaccines you need and how often you need them. This information is not intended to replace advice given to you by your health care provider. Make sure you discuss any questions you have with your health care provider. Document Released: 12/11/2015 Document Revised: 08/03/2016 Document Reviewed: 09/15/2015 Elsevier Interactive Patient Education  2017 [employer].  Fall Prevention in the Home Falls can cause injuries. They can happen to people of all ages. There are many things you can do to make your home safe and to help prevent falls. What can I do on the outside of my home? Regularly fix the edges of walkways and driveways and fix any cracks. Remove anything that might make you trip as you walk through a door, such as a raised step or threshold. Trim any bushes or trees on the path to your home. Use bright outdoor lighting. Clear any walking paths of anything that might make someone trip, such as rocks or tools. Regularly check to see if handrails are loose or broken. Make sure that both sides of any steps have handrails. Any raised decks and porches should have guardrails on the edges. Have any leaves, snow, or ice cleared regularly. Use sand or salt on walking paths during winter. Clean up any spills in your garage right away. This includes oil or grease spills. What can I do in the bathroom? Use night lights. Install grab bars by the toilet and in the tub and shower. Do not use towel bars as grab bars. Use non-skid mats or decals in the tub or shower. If you need to sit down in the shower, use a plastic, non-slip stool. Keep the floor dry. Clean up any water that spills on the floor as soon as it  happens. Remove soap buildup in the tub or shower regularly. Attach bath mats securely with double-sided non-slip rug tape. Do not have throw rugs and other things on the floor that can make you trip. What can I do in the bedroom? Use night lights. Make sure that you have a light by your bed that is easy to reach. Do not use any sheets or blankets that are too big for your bed. They should not hang down onto the floor. Have a firm chair that has side arms. You can use this for support while you get dressed. Do not have throw rugs and other things on the floor that can make you trip. What can I do in the kitchen? Clean up any spills right away. Avoid walking on wet floors. Keep items that you use a lot in easy-to-reach places. If you need to reach something above you, use a strong step stool that has a grab bar. Keep electrical cords out of the way. Do not use floor polish or wax that makes floors slippery. If you must use wax, use non-skid floor wax. Do not have throw rugs and other things on the floor that can make you trip. What can I do with my stairs? Do  not leave any items on the stairs. Make sure that there are handrails on both sides of the stairs and use them. Fix handrails that are broken or loose. Make sure that handrails are as long as the stairways. Check any carpeting to make sure that it is firmly attached to the stairs. Fix any carpet that is loose or worn. Avoid having throw rugs at the top or bottom of the stairs. If you do have throw rugs, attach them to the floor with carpet tape. Make sure that you have a light switch at the top of the stairs and the bottom of the stairs. If you do not have them, ask someone to add them for you. What else can I do to help prevent falls? Wear shoes that: Do not have high heels. Have rubber bottoms. Are comfortable and fit you well. Are closed at the toe. Do not wear sandals. If you use a stepladder: Make sure that it is fully opened.  Do not climb a closed stepladder. Make sure that both sides of the stepladder are locked into place. Ask someone to hold it for you, if possible. Clearly mark and make sure that you can see: Any grab bars or handrails. First and last steps. Where the edge of each step is. Use tools that help you move around (mobility aids) if they are needed. These include: Canes. Walkers. Scooters. Crutches. Turn on the lights when you go into a dark area. Replace any light bulbs as soon as they burn out. Set up your furniture so you have a clear path. Avoid moving your furniture around. If any of your floors are uneven, fix them. If there are any pets around you, be aware of where they are. Review your medicines with your doctor. Some medicines can make you feel dizzy. This can increase your chance of falling. Ask your doctor what other things that you can do to help prevent falls. This information is not intended to replace advice given to you by your health care provider. Make sure you discuss any questions you have with your health care provider. Document Released: 09/10/2009 Document Revised: 04/21/2016 Document Reviewed: 12/19/2014 Elsevier Interactive Patient Education  2017 [employer].

## 2023-07-13 ENCOUNTER — Encounter (INDEPENDENT_AMBULATORY_CARE_PROVIDER_SITE_OTHER): Payer: Self-pay

## 2023-07-15 ENCOUNTER — Other Ambulatory Visit: Payer: Self-pay | Admitting: Internal Medicine

## 2023-07-15 DIAGNOSIS — I10 Essential (primary) hypertension: Secondary | ICD-10-CM

## 2023-08-07 ENCOUNTER — Ambulatory Visit (INDEPENDENT_AMBULATORY_CARE_PROVIDER_SITE_OTHER): Payer: Medicare Other

## 2023-08-07 DIAGNOSIS — E538 Deficiency of other specified B group vitamins: Secondary | ICD-10-CM | POA: Diagnosis not present

## 2023-08-07 MED ORDER — CYANOCOBALAMIN 1000 MCG/ML IJ SOLN
1000.0000 ug | Freq: Once | INTRAMUSCULAR | Status: AC
Start: 2023-08-07 — End: 2023-08-07
  Administered 2023-08-07: 1000 ug via INTRAMUSCULAR

## 2023-08-07 NOTE — Progress Notes (Signed)
After obtaining consent, and per orders of Dr. Ronnald Ramp, injection of B12 given by Marrian Salvage. Patient instructed to report any adverse reaction to me immediately.

## 2023-09-06 ENCOUNTER — Ambulatory Visit (INDEPENDENT_AMBULATORY_CARE_PROVIDER_SITE_OTHER): Payer: Medicare Other | Admitting: Radiology

## 2023-09-06 DIAGNOSIS — E538 Deficiency of other specified B group vitamins: Secondary | ICD-10-CM

## 2023-09-06 MED ORDER — CYANOCOBALAMIN 1000 MCG/ML IJ SOLN
1000.0000 ug | Freq: Once | INTRAMUSCULAR | Status: AC
Start: 2023-09-06 — End: 2023-09-06
  Administered 2023-09-06: 1000 ug via INTRAMUSCULAR

## 2023-09-06 NOTE — Progress Notes (Signed)
Pt here for monthly B12 injection. Patient tolerate well with no complications.

## 2023-10-09 ENCOUNTER — Ambulatory Visit (INDEPENDENT_AMBULATORY_CARE_PROVIDER_SITE_OTHER): Payer: Medicare Other

## 2023-10-09 DIAGNOSIS — E538 Deficiency of other specified B group vitamins: Secondary | ICD-10-CM | POA: Diagnosis not present

## 2023-10-09 MED ORDER — CYANOCOBALAMIN 1000 MCG/ML IJ SOLN
1000.0000 ug | Freq: Once | INTRAMUSCULAR | Status: AC
Start: 2023-10-09 — End: 2023-10-09
  Administered 2023-10-09: 1000 ug via INTRAMUSCULAR

## 2023-10-09 NOTE — Progress Notes (Signed)
After obtaining consent, and per orders of Dr. Ronnald Ramp, injection of B12 given by Marrian Salvage. Patient instructed to report any adverse reaction to me immediately.

## 2023-10-14 ENCOUNTER — Other Ambulatory Visit: Payer: Self-pay | Admitting: Internal Medicine

## 2023-10-14 DIAGNOSIS — I10 Essential (primary) hypertension: Secondary | ICD-10-CM

## 2023-11-08 ENCOUNTER — Ambulatory Visit (INDEPENDENT_AMBULATORY_CARE_PROVIDER_SITE_OTHER): Payer: Medicare Other

## 2023-11-08 DIAGNOSIS — E538 Deficiency of other specified B group vitamins: Secondary | ICD-10-CM

## 2023-11-08 MED ORDER — CYANOCOBALAMIN 1000 MCG/ML IJ SOLN
1000.0000 ug | Freq: Once | INTRAMUSCULAR | Status: AC
Start: 2023-11-08 — End: 2023-11-08
  Administered 2023-11-08: 1000 ug via INTRAMUSCULAR

## 2023-11-08 NOTE — Progress Notes (Signed)
Pt presented for their vitamin B12 injection. Pt was identified through two identifiers. Pt tolerated shot well in their left deltoid.   Pt stated that he mad an appt at the check in because he is completely out of medication and would like the provider to know and to send in the medication.

## 2023-11-11 ENCOUNTER — Other Ambulatory Visit: Payer: Self-pay | Admitting: Internal Medicine

## 2023-11-11 DIAGNOSIS — I10 Essential (primary) hypertension: Secondary | ICD-10-CM

## 2023-11-16 ENCOUNTER — Ambulatory Visit: Payer: Medicare Other | Admitting: Internal Medicine

## 2023-11-16 ENCOUNTER — Encounter: Payer: Self-pay | Admitting: Internal Medicine

## 2023-11-16 VITALS — BP 148/84 | HR 62 | Temp 97.8°F | Resp 16 | Ht 75.0 in | Wt 207.2 lb

## 2023-11-16 DIAGNOSIS — N200 Calculus of kidney: Secondary | ICD-10-CM

## 2023-11-16 DIAGNOSIS — E538 Deficiency of other specified B group vitamins: Secondary | ICD-10-CM | POA: Diagnosis not present

## 2023-11-16 DIAGNOSIS — R739 Hyperglycemia, unspecified: Secondary | ICD-10-CM

## 2023-11-16 DIAGNOSIS — J4489 Other specified chronic obstructive pulmonary disease: Secondary | ICD-10-CM | POA: Diagnosis not present

## 2023-11-16 DIAGNOSIS — N1831 Chronic kidney disease, stage 3a: Secondary | ICD-10-CM | POA: Diagnosis not present

## 2023-11-16 DIAGNOSIS — I10 Essential (primary) hypertension: Secondary | ICD-10-CM | POA: Diagnosis not present

## 2023-11-16 LAB — URINALYSIS, ROUTINE W REFLEX MICROSCOPIC
Bilirubin Urine: NEGATIVE
Hgb urine dipstick: NEGATIVE
Ketones, ur: NEGATIVE
Leukocytes,Ua: NEGATIVE
Nitrite: NEGATIVE
Specific Gravity, Urine: 1.02 (ref 1.000–1.030)
Total Protein, Urine: 30 — AB
Urine Glucose: NEGATIVE
Urobilinogen, UA: 0.2 (ref 0.0–1.0)
pH: 6 (ref 5.0–8.0)

## 2023-11-16 LAB — CBC WITH DIFFERENTIAL/PLATELET
Basophils Absolute: 0.1 10*3/uL (ref 0.0–0.1)
Basophils Relative: 0.8 % (ref 0.0–3.0)
Eosinophils Absolute: 0.5 10*3/uL (ref 0.0–0.7)
Eosinophils Relative: 4.4 % (ref 0.0–5.0)
HCT: 48 % (ref 39.0–52.0)
Hemoglobin: 16.3 g/dL (ref 13.0–17.0)
Lymphocytes Relative: 27.6 % (ref 12.0–46.0)
Lymphs Abs: 2.9 10*3/uL (ref 0.7–4.0)
MCHC: 34 g/dL (ref 30.0–36.0)
MCV: 86.2 fL (ref 78.0–100.0)
Monocytes Absolute: 0.6 10*3/uL (ref 0.1–1.0)
Monocytes Relative: 5.8 % (ref 3.0–12.0)
Neutro Abs: 6.5 10*3/uL (ref 1.4–7.7)
Neutrophils Relative %: 61.4 % (ref 43.0–77.0)
Platelets: 193 10*3/uL (ref 150.0–400.0)
RBC: 5.57 Mil/uL (ref 4.22–5.81)
RDW: 14.2 % (ref 11.5–15.5)
WBC: 10.5 10*3/uL (ref 4.0–10.5)

## 2023-11-16 LAB — BASIC METABOLIC PANEL
BUN: 27 mg/dL — ABNORMAL HIGH (ref 6–23)
CO2: 23 meq/L (ref 19–32)
Calcium: 9.2 mg/dL (ref 8.4–10.5)
Chloride: 107 meq/L (ref 96–112)
Creatinine, Ser: 1.52 mg/dL — ABNORMAL HIGH (ref 0.40–1.50)
GFR: 40.37 mL/min — ABNORMAL LOW (ref >60.00)
Glucose, Bld: 100 mg/dL — ABNORMAL HIGH (ref 70–99)
Potassium: 5 meq/L (ref 3.5–5.1)
Sodium: 139 meq/L (ref 135–145)

## 2023-11-16 LAB — FOLATE: Folate: >24.2 ng/mL (ref >5.9)

## 2023-11-16 LAB — HEMOGLOBIN A1C: Hgb A1c MFr Bld: 5.6 % (ref 4.6–6.5)

## 2023-11-16 MED ORDER — AMLODIPINE BESYLATE 10 MG PO TABS: 10.0000 mg | ORAL_TABLET | Freq: Every day | ORAL | 1 refills | Status: DC

## 2023-11-16 NOTE — Progress Notes (Signed)
Subjective:  Patient ID: Joseph Carter, male    DOB: 15-May-1932  Age: 89 y.o. MRN: [id number]  CC: Hypertension   HPI Joseph Carter presents for f/up ----  Discussed the use of AI scribe software for clinical note transcription with the patient, who gave verbal consent to proceed.  History of Present Illness   The patient, with a history of hypertension and arthritis, reports occasional dizziness, particularly when turning right quickly. He denies any chest pain, shortness of breath, coughing, wheezing, or hemoptysis. He has received a recent flu shot and denies any symptoms of high blood pressure such as headache or blurred vision.  Despite arthritis, the patient remains active, although he describes the experience as sometimes difficult. He denies any cardiac or pulmonary symptoms during activity. The patient also reports a recent two-week period without medication due to running out and experiencing difficulties in obtaining a refill.  The patient's spouse, who has mobility issues due to a hip problem, is also mentioned. The spouse uses a walker but is otherwise in good health. The patient assists with household chores.       Outpatient Medications Prior to Visit  Medication Sig Dispense Refill   tamsulosin (FLOMAX) 0.4 MG CAPS capsule TAKE 1 CAPSULE(0.4 MG) BY MOUTH AT BEDTIME 90 capsule 1   amLODipine (NORVASC) 5 MG tablet TAKE 1 TABLET BY MOUTH EVERY DAY 90 tablet 0   cyanocobalamin 2000 MCG tablet Take 1 tablet (2,000 mcg total) by mouth daily. 90 tablet 1   irbesartan (AVAPRO) 300 MG tablet TAKE 1 TABLET(300 MG) BY MOUTH DAILY 90 tablet 0   cyanocobalamin ((VITAMIN B-12)) injection 1,000 mcg      No facility-administered medications prior to visit.    ROS Review of Systems  Constitutional:  Negative for chills, diaphoresis, fatigue and fever.  HENT: Negative.  Negative for sore throat and trouble swallowing.   Eyes: Negative.   Respiratory:  Negative for cough, chest  tightness, shortness of breath and wheezing.   Cardiovascular:  Negative for chest pain, palpitations and leg swelling.  Gastrointestinal: Negative.  Negative for abdominal pain, constipation, diarrhea, nausea and vomiting.  Genitourinary: Negative.  Negative for difficulty urinating.  Musculoskeletal:  Positive for arthralgias, back pain and gait problem. Negative for myalgias and neck pain.  Skin: Negative.   Neurological:  Negative for dizziness, weakness and light-headedness.  Hematological:  Negative for adenopathy. Does not bruise/bleed easily.  Psychiatric/Behavioral: Negative.      Objective:  BP (!) 148/84 (BP Location: Left Arm, Patient Position: Sitting, Cuff Size: Normal)   Pulse 62   Temp 97.8 F (36.6 C) (Oral)   Resp 16   Ht 6\' 3"  (1.905 m)   Wt 207 lb 3.2 oz (94 kg)   SpO2 95%   BMI 25.90 kg/m   BP Readings from Last 3 Encounters:  11/16/23 (!) 148/84  01/09/23 138/80  08/26/21 (!) 156/78    Wt Readings from Last 3 Encounters:  11/16/23 207 lb 3.2 oz (94 kg)  07/07/23 197 lb (89.4 kg)  01/09/23 203 lb (92.1 kg)    Physical Exam Vitals reviewed.  Constitutional:      General: He is not in acute distress.    Appearance: He is ill-appearing. He is not toxic-appearing or diaphoretic.  HENT:     Nose: Nose normal.     Mouth/Throat:     Mouth: Mucous membranes are moist.  Eyes:     General: No scleral icterus.    Conjunctiva/sclera: Conjunctivae normal.  Cardiovascular:     Rate and Rhythm: Normal rate and regular rhythm.     Heart sounds: Murmur heard.     Systolic murmur is present with a grade of 1/6.     Diastolic murmur is present with a grade of 2/4.     No friction rub. No gallop.     Comments: 1/6 SEM RUSB Pulmonary:     Effort: Pulmonary effort is normal.     Breath sounds: No stridor. No wheezing, rhonchi or rales.  Abdominal:     General: Abdomen is flat.     Palpations: There is no mass.     Tenderness: There is no abdominal  tenderness. There is no guarding.     Hernia: No hernia is present.  Musculoskeletal:        General: No swelling.     Cervical back: Neck supple.     Right lower leg: No edema.     Left lower leg: No edema.  Skin:    General: Skin is warm and dry.  Neurological:     General: No focal deficit present.     Mental Status: He is alert. Mental status is at baseline.  Psychiatric:        Mood and Affect: Mood normal.        Behavior: Behavior normal.     Lab Results  Component Value Date   WBC 10.5 11/16/2023   HGB 16.3 11/16/2023   HCT 48.0 11/16/2023   PLT 193.0 11/16/2023   GLUCOSE 100 (H) 11/16/2023   CHOL 158 11/05/2020   TRIG 80.0 11/05/2020   HDL 39.70 11/05/2020   LDLCALC 102 (H) 11/05/2020   ALT 10 01/09/2023   AST 14 01/09/2023   NA 139 11/16/2023   K 5.0 11/16/2023   CL 107 11/16/2023   CREATININE 1.52 (H) 11/16/2023   BUN 27 (H) 11/16/2023   CO2 23 11/16/2023   TSH 2.41 01/09/2023   PSA 0.00 (L) 01/09/2023   INR 1.09 02/27/2014   HGBA1C 5.6 11/16/2023    DG Chest 2 View Result Date: 11/03/2016 CLINICAL DATA:  Shortness of breath. EXAM: CHEST  2 VIEW COMPARISON:  03/21/2016. FINDINGS: Mediastinum and hilar structures are normal. Stable cardiomegaly. No focal alveolar infiltrate. No pleural effusion or pneumothorax. Pleural thickening on the right has improved. Old right rib fractures noted. Metallic densities noted over the right chest . IMPRESSION: 1.  Stable cardiomegaly.  No acute pulmonary disease. 2.  Old right rib fractures are again noted. Electronically Signed   By: Maisie Fus  Register   On: 11/03/2016 13:29    Assessment & Plan:   Essential hypertension-his blood pressure is well-controlled but he has a prerenal azotemia.  Will discontinue the ARB and increase the dose of the CCB. -     Basic metabolic panel; Future -     amLODIPine Besylate; Take 1 tablet (10 mg total) by mouth daily.  Dispense: 90 tablet; Refill: 1 -     AMB Referral [facility] Care  Management  COPD (chronic obstructive pulmonary disease) with chronic bronchitis (HCC)- This is stable. -     CBC with Differential/Platelet; Future  Chronic hyperglycemia -     Basic metabolic panel; Future -     Hemoglobin A1c; Future  Stage 3a chronic kidney disease (HCC) -     Basic metabolic panel; Future -     AMB Referral [facility]I Care Management  B12 deficiency -     CBC with Differential/Platelet; Future -  Folate; Future  Kidney stones -     Urinalysis, Routine w reflex microscopic; Future     Follow-up: Return in about 6 months (around 05/16/2024).  Sanda Linger, MD

## 2023-11-16 NOTE — Patient Instructions (Signed)
Hypertension, Adult High blood pressure (hypertension) is when the force of blood pumping through the arteries is too strong. The arteries are the blood vessels that carry blood from the heart throughout the body. Hypertension forces the heart to work harder to pump blood and may cause arteries to become narrow or stiff. Untreated or uncontrolled hypertension can lead to a heart attack, heart failure, a stroke, kidney disease, and other problems. A blood pressure reading consists of a higher number over a lower number. Ideally, your blood pressure should be below 120/80. The first ("top") number is called the systolic pressure. It is a measure of the pressure in your arteries as your heart beats. The second ("bottom") number is called the diastolic pressure. It is a measure of the pressure in your arteries as the heart relaxes. What are the causes? The exact cause of this condition is not known. There are some conditions that result in high blood pressure. What increases the risk? Certain factors may make you more likely to develop high blood pressure. Some of these risk factors are under your control, including: Smoking. Not getting enough exercise or physical activity. Being overweight. Having too much fat, sugar, calories, or salt (sodium) in your diet. Drinking too much alcohol. Other risk factors include: Having a personal history of heart disease, diabetes, high cholesterol, or kidney disease. Stress. Having a family history of high blood pressure and high cholesterol. Having obstructive sleep apnea. Age. The risk increases with age. What are the signs or symptoms? High blood pressure may not cause symptoms. Very high blood pressure (hypertensive crisis) may cause: Headache. Fast or irregular heartbeats (palpitations). Shortness of breath. Nosebleed. Nausea and vomiting. Vision changes. Severe chest pain, dizziness, and seizures. How is this diagnosed? This condition is diagnosed by  measuring your blood pressure while you are seated, with your arm resting on a flat surface, your legs uncrossed, and your feet flat on the floor. The cuff of the blood pressure monitor will be placed directly against the skin of your upper arm at the level of your heart. Blood pressure should be measured at least twice using the same arm. Certain conditions can cause a difference in blood pressure between your right and left arms. If you have a high blood pressure reading during one visit or you have normal blood pressure with other risk factors, you may be asked to: Return on a different day to have your blood pressure checked again. Monitor your blood pressure at home for 1 week or longer. If you are diagnosed with hypertension, you may have other blood or imaging tests to help your health care provider understand your overall risk for other conditions. How is this treated? This condition is treated by making healthy lifestyle changes, such as eating healthy foods, exercising more, and reducing your alcohol intake. You may be referred for counseling on a healthy diet and physical activity. Your health care provider may prescribe medicine if lifestyle changes are not enough to get your blood pressure under control and if: Your systolic blood pressure is above 130. Your diastolic blood pressure is above 80. Your personal target blood pressure may vary depending on your medical conditions, your age, and other factors. Follow these instructions at home: Eating and drinking  Eat a diet that is high in fiber and potassium, and low in sodium, added sugar, and fat. An example of this eating plan is called the DASH diet. DASH stands for Dietary Approaches to Stop Hypertension. To eat this way: Eat   plenty of fresh fruits and vegetables. Try to fill one half of your plate at each meal with fruits and vegetables. Eat whole grains, such as whole-wheat pasta, brown rice, or whole-grain bread. Fill about one  fourth of your plate with whole grains. Eat or drink low-fat dairy products, such as skim milk or low-fat yogurt. Avoid fatty cuts of meat, processed or cured meats, and poultry with skin. Fill about one fourth of your plate with lean proteins, such as fish, chicken without skin, beans, eggs, or tofu. Avoid pre-made and processed foods. These tend to be higher in sodium, added sugar, and fat. Reduce your daily sodium intake. Many people with hypertension should eat less than 1,500 mg of sodium a day. Do not drink alcohol if: Your health care provider tells you not to drink. You are pregnant, may be pregnant, or are planning to become pregnant. If you drink alcohol: Limit how much you have to: 0-1 drink a day for women. 0-2 drinks a day for men. Know how much alcohol is in your drink. In the [place] one drink equals one 12 oz bottle of beer (355 mL), one 5 oz glass of wine (148 mL), or one 1 oz glass of hard liquor (44 mL). Lifestyle  Work with your health care provider to maintain a healthy body weight or to lose weight. Ask what an ideal weight is for you. Get at least 30 minutes of exercise that causes your heart to beat faster (aerobic exercise) most days of the week. Activities may include walking, swimming, or biking. Include exercise to strengthen your muscles (resistance exercise), such as Pilates or lifting weights, as part of your weekly exercise routine. Try to do these types of exercises for 30 minutes at least 3 days a week. Do not use any products that contain nicotine or tobacco. These products include cigarettes, chewing tobacco, and vaping devices, such as e-cigarettes. If you need help quitting, ask your health care provider. Monitor your blood pressure at home as told by your health care provider. Keep all follow-up visits. This is important. Medicines Take over-the-counter and prescription medicines only as told by your health care provider. Follow directions carefully. Blood  pressure medicines must be taken as prescribed. Do not skip doses of blood pressure medicine. Doing this puts you at risk for problems and can make the medicine less effective. Ask your health care provider about side effects or reactions to medicines that you should watch for. Contact a health care provider if you: Think you are having a reaction to a medicine you are taking. Have headaches that keep coming back (recurring). Feel dizzy. Have swelling in your ankles. Have trouble with your vision. Get help right away if you: Develop a severe headache or confusion. Have unusual weakness or numbness. Feel faint. Have severe pain in your chest or abdomen. Vomit repeatedly. Have trouble breathing. These symptoms may be an emergency. Get help right away. Call 911. Do not wait to see if the symptoms will go away. Do not drive yourself to the hospital. Summary Hypertension is when the force of blood pumping through your arteries is too strong. If this condition is not controlled, it may put you at risk for serious complications. Your personal target blood pressure may vary depending on your medical conditions, your age, and other factors. For most people, a normal blood pressure is less than 120/80. Hypertension is treated with lifestyle changes, medicines, or a combination of both. Lifestyle changes include losing weight, eating a healthy,   low-sodium diet, exercising more, and limiting alcohol. This information is not intended to replace advice given to you by your health care provider. Make sure you discuss any questions you have with your health care provider. Document Revised: 09/21/2021 Document Reviewed: 09/21/2021 Elsevier Patient Education  2024 Elsevier Inc.  

## 2023-11-20 ENCOUNTER — Telehealth: Payer: Self-pay

## 2023-11-20 NOTE — Progress Notes (Unsigned)
Care Guide Pharmacy Note  11/20/2023 Name: Joseph Carter MRN: [id number] DOB: 1932-08-10  Referred By: Joseph Grandchild, MD Reason for referral: Care Coordination (Outreach to schedule with Pharm d )   Joseph Carter is a 89 y.o. year old male who is a primary care patient of Joseph Grandchild, MD.  Joseph Carter was referred to the pharmacist for assistance related to: HTN  An unsuccessful telephone outreach was attempted today to contact the patient who was referred to the pharmacy team for assistance with medication management. Additional attempts will be made to contact the patient.  Joseph Carter , RMA     [facility]  [facility], [facility] Guide  Direct Dial: [phone]  Website: [email or web address]

## 2023-11-21 ENCOUNTER — Other Ambulatory Visit: Payer: Self-pay | Admitting: Internal Medicine

## 2023-11-21 DIAGNOSIS — N401 Enlarged prostate with lower urinary tract symptoms: Secondary | ICD-10-CM

## 2023-11-23 NOTE — Progress Notes (Signed)
Care Guide Pharmacy Note  11/23/2023 Name: Joseph Carter MRN: [id number] DOB: 05-Jun-1932  Referred By: Etta Grandchild, MD Reason for referral: Care Coordination (Outreach to schedule with Pharm d )   Joseph Carter is a 89 y.o. year old male who is a primary care patient of Etta Grandchild, MD.  Vickie Epley was referred to the pharmacist for assistance related to: HTN and COPD  Successful contact was made with the patient to discuss pharmacy services including being ready for the pharmacist to call at least 5 minutes before the scheduled appointment time and to have medication bottles and any blood pressure readings ready for review. The patient agreed to meet with the pharmacist via telephone visit on (date/time).12/05/2023  Penne Lash , RMA     [place]  [facility], [facility] Guide  Direct Dial: [phone]  Website: [email or web address]

## 2023-12-05 ENCOUNTER — Other Ambulatory Visit: Payer: Medicare Other

## 2023-12-05 ENCOUNTER — Telehealth: Payer: Self-pay | Admitting: Internal Medicine

## 2023-12-05 NOTE — Telephone Encounter (Signed)
 Copied from CRM [id number]. Topic: General - Other >> Dec 05, 2023 11:13 AM Denese Killings wrote: Reason for CRM: Patient is returning a call for appointment that was at 29.

## 2023-12-05 NOTE — Telephone Encounter (Signed)
 Called pt back and he answered but then got disconnected. Tried calling back and LVM with call back number.  Darrelyn Drum, PharmD, BCPS, CPP Clinical Pharmacist Practitioner [facility] at [facility] [phone]

## 2023-12-25 ENCOUNTER — Telehealth: Payer: Self-pay

## 2023-12-25 NOTE — Progress Notes (Signed)
Complex Care Management Care Guide Note  12/25/2023 Name: Joseph Carter MRN: [id number] DOB: September 21, 1932  Joseph Carter is a 89 y.o. year old male who is a primary care patient of Etta Grandchild, MD and is actively engaged with the care management team. I reached out to Vickie Epley by phone today to assist with re-scheduling  with the Pharmacist.  Follow up plan: Patient declined rescheduling with Pharm d   Penne Lash , RMA     [address]  [facility], [facility] Guide  Direct Dial: [phone]  Website: [email or web address]

## 2023-12-25 NOTE — Progress Notes (Signed)
Complex Care Management Care Guide Note  12/25/2023 Name: Joseph Carter MRN: [id number] DOB: 15-Aug-1932  [name] is a 89 y.o. year old male who is a primary care patient of Etta Grandchild, MD and is actively engaged with the care management team. I reached out to Vickie Epley by phone today to assist with re-scheduling  with the Pharmacist.  Follow up plan: Unsuccessful telephone outreach attempt made. A HIPAA compliant phone message was left for the patient providing contact information and requesting a return call.  Penne Lash , RMA     [facility]  [facility], [facility] Guide  Direct Dial: [phone]  Website: [email or web address]

## 2024-01-10 ENCOUNTER — Ambulatory Visit (INDEPENDENT_AMBULATORY_CARE_PROVIDER_SITE_OTHER): Payer: Medicare Other

## 2024-01-10 DIAGNOSIS — E538 Deficiency of other specified B group vitamins: Secondary | ICD-10-CM

## 2024-01-10 MED ORDER — CYANOCOBALAMIN 1000 MCG/ML IJ SOLN
1000.0000 ug | Freq: Once | INTRAMUSCULAR | Status: AC
  Administered 2024-01-10: 1000 ug via INTRAMUSCULAR

## 2024-01-10 NOTE — Progress Notes (Signed)
Pt was given B12 injection w/o any complications at this time.

## 2024-02-07 ENCOUNTER — Ambulatory Visit (INDEPENDENT_AMBULATORY_CARE_PROVIDER_SITE_OTHER): Payer: Medicare Other

## 2024-02-07 DIAGNOSIS — E538 Deficiency of other specified B group vitamins: Secondary | ICD-10-CM

## 2024-02-07 MED ORDER — CYANOCOBALAMIN 1000 MCG/ML IJ SOLN
1000.0000 ug | Freq: Once | INTRAMUSCULAR | Status: AC
Start: 2024-02-07 — End: 2024-02-07
  Administered 2024-02-07: 1000 ug via INTRAMUSCULAR

## 2024-02-07 NOTE — Progress Notes (Signed)
 Patient visits today for their b-12 injection. Patient informed of what they had received and tolerated injection well. Patient notified to reach out to office if needed.

## 2024-03-11 ENCOUNTER — Ambulatory Visit (INDEPENDENT_AMBULATORY_CARE_PROVIDER_SITE_OTHER)

## 2024-03-11 DIAGNOSIS — E538 Deficiency of other specified B group vitamins: Secondary | ICD-10-CM

## 2024-03-11 MED ORDER — CYANOCOBALAMIN 1000 MCG/ML IJ SOLN
1000.0000 ug | Freq: Once | INTRAMUSCULAR | Status: AC
Start: 2024-03-11 — End: 2024-03-11
  Administered 2024-03-11: 1000 ug via INTRAMUSCULAR

## 2024-03-11 NOTE — Progress Notes (Signed)
 Patient visits today for their b-12 injection. Patient informed of what they had received and tolerated injection well. Patient notified to reach out to office if needed.

## 2024-04-04 ENCOUNTER — Encounter (HOSPITAL_COMMUNITY): Payer: Self-pay

## 2024-04-11 ENCOUNTER — Ambulatory Visit

## 2024-04-11 DIAGNOSIS — E538 Deficiency of other specified B group vitamins: Secondary | ICD-10-CM | POA: Diagnosis not present

## 2024-04-11 MED ORDER — CYANOCOBALAMIN 1000 MCG/ML IJ SOLN
1000.0000 ug | Freq: Once | INTRAMUSCULAR | Status: AC
  Administered 2024-04-11: 1000 ug via INTRAMUSCULAR

## 2024-04-11 NOTE — Progress Notes (Signed)
Pt was given B12 injection with no complications.

## 2024-05-15 ENCOUNTER — Ambulatory Visit (INDEPENDENT_AMBULATORY_CARE_PROVIDER_SITE_OTHER)

## 2024-05-15 DIAGNOSIS — E538 Deficiency of other specified B group vitamins: Secondary | ICD-10-CM

## 2024-05-15 MED ORDER — CYANOCOBALAMIN 1000 MCG/ML IJ SOLN
1000.0000 ug | Freq: Once | INTRAMUSCULAR | Status: AC
  Administered 2024-05-15: 1000 ug via INTRAMUSCULAR

## 2024-05-15 NOTE — Progress Notes (Signed)
 Patient visits today for their b-12 injection. Patient informed of what they had received and tolerated injection well. Patient notified to reach out to office if needed.

## 2024-06-12 ENCOUNTER — Ambulatory Visit (INDEPENDENT_AMBULATORY_CARE_PROVIDER_SITE_OTHER)

## 2024-06-12 DIAGNOSIS — E538 Deficiency of other specified B group vitamins: Secondary | ICD-10-CM

## 2024-06-12 MED ORDER — CYANOCOBALAMIN 1000 MCG/ML IJ SOLN
1000.0000 ug | Freq: Once | INTRAMUSCULAR | Status: AC
  Administered 2024-06-12: 1000 ug via INTRAMUSCULAR

## 2024-06-12 NOTE — Progress Notes (Signed)
 After obtaining consent, and per orders of Dr. Joshua, injection of B12 given by Ronnald SHAUNNA Palms. Patient instructed to remain in report any adverse reaction to me immediately.

## 2024-06-28 ENCOUNTER — Ambulatory Visit

## 2024-07-03 ENCOUNTER — Other Ambulatory Visit: Payer: Self-pay | Admitting: Internal Medicine

## 2024-07-03 DIAGNOSIS — I10 Essential (primary) hypertension: Secondary | ICD-10-CM

## 2024-07-04 NOTE — Telephone Encounter (Signed)
 Last OV 11/16/23, f/u 6 months Next OV 07/15/24  Last refill - d/c     Disp Refills Start End   irbesartan  (AVAPRO ) 300 MG tablet (Discontinued) 90 tablet 0 11/11/2023 11/16/2023   Sig: TAKE 1 TABLET(300 MG) BY MOUTH DAILY   Sent to pharmacy as: irbesartan  (AVAPRO ) 300 MG tablet   Reason for Discontinue: Discontinued by provider   E-Prescribing Status: Receipt confirmed by pharmacy (11/11/2023 11:20 AM EST)

## 2024-07-15 ENCOUNTER — Ambulatory Visit (INDEPENDENT_AMBULATORY_CARE_PROVIDER_SITE_OTHER)

## 2024-07-15 DIAGNOSIS — E538 Deficiency of other specified B group vitamins: Secondary | ICD-10-CM | POA: Diagnosis not present

## 2024-07-15 MED ORDER — CYANOCOBALAMIN 1000 MCG/ML IJ SOLN
1000.0000 ug | Freq: Once | INTRAMUSCULAR | Status: AC
  Administered 2024-07-15: 1000 ug via INTRAMUSCULAR

## 2024-07-15 NOTE — Progress Notes (Signed)
 Pt here for monthly B12 injection per Dr. Joshua  B12 1000mcg given IM and pt tolerated injection well.  Next B12 injection has been scheduled with the front desk staff.

## 2024-07-23 ENCOUNTER — Ambulatory Visit (INDEPENDENT_AMBULATORY_CARE_PROVIDER_SITE_OTHER)

## 2024-07-23 VITALS — Ht 75.0 in | Wt 207.0 lb

## 2024-07-23 DIAGNOSIS — Z0000 Encounter for general adult medical examination without abnormal findings: Secondary | ICD-10-CM | POA: Diagnosis not present

## 2024-07-23 NOTE — Patient Instructions (Signed)
 Mr. Van , Thank you for taking time out of your busy schedule to complete your Annual Wellness Visit with me. I enjoyed our conversation and look forward to speaking with you again next year. I, as well as your care team,  appreciate your ongoing commitment to your health goals. Please review the following plan we discussed and let me know if I can assist you in the future. Your Game plan/ To Do List    Referrals: If you haven't heard from the office you've been referred to, please reach out to them at the phone provided.   Follow up Visits: We will see or speak with you next year for your Next Medicare AWV with our clinical staff Have you seen your provider in the last 6 months (3 months if uncontrolled diabetes)? No  Clinician Recommendations:  Aim for 30 minutes of exercise or brisk walking, 6-8 glasses of water, and 5 servings of fruits and vegetables each day.       This is a list of the screenings recommended for you:  Health Maintenance  Topic Date Due   Flu Shot  06/28/2024   Medicare Annual Wellness Visit  07/23/2025   DTaP/Tdap/Td vaccine (3 - Td or Tdap) 11/05/2030   Pneumococcal Vaccine for age over 47  Completed   HPV Vaccine  Aged Out   Meningitis B Vaccine  Aged Out   COVID-19 Vaccine  Discontinued   Zoster (Shingles) Vaccine  Discontinued    Advanced directives: (Declined) Advance directive discussed with you today. Even though you declined this today, please call our office should you change your mind, and we can give you the proper paperwork for you to fill out. Advance Care Planning is important because it:  [x]  Makes sure you receive the medical care that is consistent with your values, goals, and preferences  [x]  It provides guidance to your family and loved ones and reduces their decisional burden about whether or not they are making the right decisions based on your wishes.  Follow the link provided in your after visit summary or read over the paperwork we  have mailed to you to help you started getting your Advance Directives in place. If you need assistance in completing these, please reach out to us  so that we can help you!

## 2024-07-23 NOTE — Progress Notes (Signed)
 Subjective:   Joseph Carter is a 89 y.o. who presents for a Medicare Wellness preventive visit.  As a reminder, Annual Wellness Visits don't include a physical exam, and some assessments may be limited, especially if this visit is performed virtually. We may recommend an in-person follow-up visit with your provider if needed.  Visit Complete: Virtual I connected with  Joseph Carter on 07/23/24 by a audio enabled telemedicine application and verified that I am speaking with the correct person using two identifiers.  Patient Location: Home  Provider Location: Office/Clinic  I discussed the limitations of evaluation and management by telemedicine. The patient expressed understanding and agreed to proceed.  Vital Signs: Because this visit was a virtual/telehealth visit, some criteria may be missing or patient reported. Any vitals not documented were not able to be obtained and vitals that have been documented are patient reported.  VideoDeclined- This patient declined Librarian, academic. Therefore the visit was completed with audio only.  Persons Participating in Visit: Patient.  AWV Questionnaire: No: Patient Medicare AWV questionnaire was not completed prior to this visit.  Cardiac Risk Factors include: advanced age (>30men, >27 women);male gender;hypertension     Objective:    Today's Vitals   07/23/24 1011  Weight: 207 lb (93.9 kg)  Height: 6' 3 (1.905 m)   Body mass index is 25.87 kg/m.     07/23/2024   10:11 AM 07/07/2023    2:13 PM 07/14/2022    8:56 AM 07/07/2021    9:56 AM 11/03/2016   10:23 AM 03/07/2014    4:31 PM 02/27/2014    9:35 AM  Advanced Directives  Does Patient Have a Medical Advance Directive? No Yes No No No  Patient does not have advance directive;Patient would not like information  Patient does not have advance directive;Patient would like information   Type of Advance Directive  Healthcare Power of [place];Living will        Copy of Healthcare Power of Attorney in Chart?  No - copy requested       Would patient like information on creating a medical advance directive? No - Patient declined  No - Patient declined No - Patient declined No - Patient declined   Advance directive packet given   Pre-existing out of facility DNR order (yellow form or pink MOST form)      No  No      Data saved with a previous flowsheet row definition    Current Medications (verified) Outpatient Encounter Medications as of 07/23/2024  Medication Sig   amLODipine  (NORVASC ) 10 MG tablet Take 1 tablet (10 mg total) by mouth daily.   tamsulosin  (FLOMAX ) 0.4 MG CAPS capsule TAKE 1 CAPSULE(0.4 MG) BY MOUTH AT BEDTIME   No facility-administered encounter medications on file as of 07/23/2024.    Allergies (verified) Lisinopril   History: Past Medical History:  Diagnosis Date   Adenocarcinoma of prostate (HCC)    BPH (benign prostatic hyperplasia)    Cervical spondylosis without myelopathy    COPD (chronic obstructive pulmonary disease) (HCC)    Diverticular disease    DJD (degenerative joint disease)    Exposure to asbestos    History of kidney stones    Hypercholesteremia    Hypertension    Hypogonadism male    Lumbar back pain    PAC (premature atrial contraction)    Pernicious anemia    Past Surgical History:  Procedure Laterality Date   APPENDECTOMY     CTS surgery  on right     Dr. Camella. Carpal tunnel tunnel   LUMBAR LAMINECTOMY  03/07/2014   L3 L4   DR JOSHUA   lumbar laminectomy L4-5  2001   Dr. Amon   LUNG SURGERY     /w Dr. Brantley   MAXIMUM ACCESS (MAS)POSTERIOR LUMBAR INTERBODY FUSION (PLIF) 1 LEVEL N/A 03/07/2014   Procedure: FOR MAXIMUM ACCESS (MAS) POSTERIOR LUMBAR INTERBODY FUSION (PLIF) 1 LEVEL;  Surgeon: Alm GORMAN JOSHUA, MD;  Location: [facility];  Service: Neurosurgery;  Laterality: N/A;  FOR MAXIMUM ACCESS (MAS) POSTERIOR LUMBAR INTERBODY FUSION (PLIF) 1 LEVEL   PARATHYROIDECTOMY Left 11/08/2016    Procedure: LEFT INFERIOR MINIMALLY INVASIVE PARATHYROIDECTOMY;  Surgeon: Krystal Spinner, MD;  Location: [facility];  Service: General;  Laterality: Left;   prostate biopsy, then brachytherapy  04/2010   Dr. 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, trucks and farming    Employer: RETIRED  Tobacco Use   Smoking status: Former    Types: Cigars    Quit date: 11/28/1969    Years since quitting: 54.6   Smokeless tobacco: Former    Types: Chew    Quit date: 11/28/1969  Vaping Use   Vaping status: Never Used  Substance and Sexual Activity   Alcohol use: Yes    Alcohol/week: 5.0 standard drinks of alcohol    Types: 5 Shots of liquor per week    Comment: occas    Drug use: No   Sexual activity: Not Currently  Other Topics Concern   Not on file  Social History Narrative   Married   Social Drivers of Health   Financial Resource Strain: Low Risk  (07/23/2024)   Overall Financial Resource Strain (CARDIA)    Difficulty of Paying Living Expenses: Not hard at all  Food Insecurity: No Food Insecurity (07/23/2024)   Hunger Vital Sign    Worried About Running Out of Food in the Last Year: Never true    Ran Out of Food in the Last Year: Never true  Transportation Needs: No Transportation Needs (07/23/2024)   PRAPARE - Administrator, Civil Service (Medical): No    Lack of Transportation (Non-Medical): No  Physical Activity: Insufficiently Active (07/23/2024)   Exercise Vital Sign    Days of Exercise per Week: 5 days    Minutes of Exercise per Session: 10 min  Stress: No Stress Concern Present (07/23/2024)    [employer] of Occupational Health - Occupational Stress Questionnaire    Feeling of Stress: Not at all  Social Connections: Socially Isolated (07/23/2024)   Social Connection and Isolation Panel    Frequency of Communication with Friends and Family: Once a week    Frequency of Social Gatherings with Friends and Family: Once a week    Attends Religious Services: Never    Database administrator or Organizations: No    Attends Banker Meetings: Never    Marital Status: Married    Tobacco Counseling Counseling given: No  Clinical Intake:  Pre-visit preparation completed: Yes  Pain : No/denies pain     BMI - recorded: 25.87 Nutritional Status: BMI 25 -29 Overweight Nutritional Risks: None Diabetes: No  Lab Results  Component Value Date   HGBA1C 5.6 11/16/2023   HGBA1C 5.5 01/09/2023   HGBA1C 5.4 11/05/2020     How often do you need to have someone help you when you read instructions, pamphlets, or other written materials from your doctor or pharmacy?: 3 - Sometimes (Spouse helps)  Interpreter Needed?: No  Information entered by :: Verdie Saba, CMA   Activities of Daily Living     07/23/2024   10:16 AM  In your present state of health, do you have any difficulty performing the following activities:  Hearing? 1  Comment wears hearing aids and plans to get new pairs  Vision? 0  Difficulty concentrating or making decisions? 0  Walking or climbing stairs? 0  Dressing or bathing? 0  Doing errands, shopping? 0  Preparing Food and eating ? N  Using the Toilet? N  In the past six months, have you accidently leaked urine? Y  Do you have problems with loss of bowel control? N  Managing your Medications? Y  Comment Spouse helps  Managing your Finances? Y  Comment Spouse helps  Housekeeping or managing your Housekeeping? Y  Comment Spouse helps    Patient Care Team: Joshua Debby CROME, MD as PCP - General (Internal Medicine)  I have updated your  Care Teams any recent Medical Services you may have received from other providers in the past year.     Assessment:   This is a routine wellness examination for Joseph Carter.  Hearing/Vision screen Hearing Screening - Comments:: Wears hearing aids Vision Screening - Comments:: Denies vision concerns   Goals Addressed               This Visit's Progress     Patient Stated (pt-stated)        Patient stated he plans to keep walking        Depression Screen     07/23/2024   10:18 AM 07/07/2023    2:02 PM 07/14/2022    8:55 AM 07/07/2021    9:48 AM 11/05/2020    9:42 AM 07/25/2018   12:01 PM 07/24/2018   11:31 AM  PHQ 2/9 Scores  PHQ - 2 Score 0 0 0 0 0 0 0  PHQ- 9 Score 0 2         Fall Risk     07/23/2024   10:17 AM 07/07/2023    1:56 PM 07/14/2022    8:50 AM 07/07/2021    9:49 AM 11/05/2020    9:42 AM  Fall Risk   Falls in the past year? 0 0 0 0 0  Number falls in past yr: 0 0 0 0   Injury with Fall? 0 0 0 0   Risk for fall due to : No Fall Risks No Fall Risks No Fall Risks No Fall Risks   Follow up Falls evaluation completed;Falls prevention discussed Falls prevention discussed Falls evaluation completed  Falls evaluation completed       Data saved with a previous flowsheet row definition    MEDICARE RISK AT HOME:  Medicare Risk at Home Any stairs in or around the home?: Yes If so, are there any without handrails?: No Home free of loose throw rugs in walkways, pet beds, electrical cords, etc?: Yes Adequate lighting in your home to reduce risk  of falls?: Yes Life alert?: No Use of a cane, walker or w/c?: No Grab bars in the bathroom?: No Shower chair or bench in shower?: No Elevated toilet seat or a handicapped toilet?: Yes  TIMED UP AND GO:  Was the test performed?  No  Cognitive Function: 6CIT completed        07/23/2024   10:21 AM 07/07/2023    1:57 PM 07/14/2022    8:59 AM  6CIT Screen  What Year? 0 points 0 points 0 points  What month? 0 points 0 points 0  points  What time? 0 points 0 points 0 points  Count back from 20 0 points 0 points 0 points  Months in reverse 0 points 0 points 0 points  Repeat phrase 0 points 4 points 0 points  Total Score 0 points 4 points 0 points    Immunizations Immunization History  Administered Date(s) Administered   Fluad Quad(high Dose 65+) 07/29/2019, 09/10/2020, 08/26/2021, 09/27/2022   INFLUENZA, HIGH DOSE SEASONAL PF 08/22/2016, 09/20/2017   Influenza Split 10/03/2012   Influenza,inj,Quad PF,6+ Mos 09/09/2013, 09/16/2014, 08/17/2015, 07/24/2018   PFIZER(Purple Top)SARS-COV-2 Vaccination 01/31/2020, 03/03/2020   Pneumococcal Conjugate-13 09/20/2016   Pneumococcal Polysaccharide-23 10/13/1999, 04/30/2009, 01/02/2017, 01/09/2023   Td 04/30/2009   Tdap 11/05/2020    Screening Tests Health Maintenance  Topic Date Due   INFLUENZA VACCINE  06/28/2024   Medicare Annual Wellness (AWV)  07/23/2025   DTaP/Tdap/Td (3 - Td or Tdap) 11/05/2030   Pneumococcal Vaccine: 50+ Years  Completed   HPV VACCINES  Aged Out   Meningococcal B Vaccine  Aged Out   COVID-19 Vaccine  Discontinued   Zoster Vaccines- Shingrix  Discontinued    Health Maintenance  Health Maintenance Due  Topic Date Due   INFLUENZA VACCINE  06/28/2024   Health Maintenance Items Addressed:07/23/2024  Additional Screening:  Vision Screening: Recommended annual ophthalmology exams for early detection of glaucoma and other disorders of the eye. Would you like a referral to an eye doctor? No    Dental Screening: Recommended annual dental exams for proper oral hygiene  Community Resource Referral / Chronic Care Management: CRR required this visit?  No   CCM required this visit?  No   Plan:    I have personally reviewed and noted the following in the patient's chart:   Medical and social history Use of alcohol, tobacco or illicit drugs  Current medications and supplements including opioid prescriptions. Patient is not currently  taking opioid prescriptions. Functional ability and status Nutritional status Physical activity Advanced directives List of other physicians Hospitalizations, surgeries, and ER visits in previous 12 months Vitals Screenings to include cognitive, depression, and falls Referrals and appointments  In addition, I have reviewed and discussed with patient certain preventive protocols, quality metrics, and best practice recommendations. A written personalized care plan for preventive services as well as general preventive health recommendations were provided to patient.   Verdie CHRISTELLA Saba, CMA   07/23/2024   After Visit Summary: (MyChart) Due to this being a telephonic visit, the after visit summary with patients personalized plan was offered to patient via MyChart   Notes: Nothing significant to report at this time.

## 2024-07-24 ENCOUNTER — Encounter: Payer: Self-pay | Admitting: Internal Medicine

## 2024-07-24 ENCOUNTER — Ambulatory Visit: Attending: Internal Medicine

## 2024-07-24 ENCOUNTER — Ambulatory Visit (INDEPENDENT_AMBULATORY_CARE_PROVIDER_SITE_OTHER): Admitting: Internal Medicine

## 2024-07-24 VITALS — BP 162/86 | HR 53 | Temp 97.6°F | Ht 75.0 in | Wt 199.0 lb

## 2024-07-24 DIAGNOSIS — R001 Bradycardia, unspecified: Secondary | ICD-10-CM

## 2024-07-24 DIAGNOSIS — N1831 Chronic kidney disease, stage 3a: Secondary | ICD-10-CM

## 2024-07-24 DIAGNOSIS — I10 Essential (primary) hypertension: Secondary | ICD-10-CM

## 2024-07-24 DIAGNOSIS — E538 Deficiency of other specified B group vitamins: Secondary | ICD-10-CM

## 2024-07-24 DIAGNOSIS — N200 Calculus of kidney: Secondary | ICD-10-CM

## 2024-07-24 DIAGNOSIS — E785 Hyperlipidemia, unspecified: Secondary | ICD-10-CM

## 2024-07-24 DIAGNOSIS — Z0000 Encounter for general adult medical examination without abnormal findings: Secondary | ICD-10-CM

## 2024-07-24 DIAGNOSIS — Z0001 Encounter for general adult medical examination with abnormal findings: Secondary | ICD-10-CM

## 2024-07-24 LAB — URINALYSIS, ROUTINE W REFLEX MICROSCOPIC
Bilirubin Urine: NEGATIVE
Hgb urine dipstick: NEGATIVE
Ketones, ur: NEGATIVE
Leukocytes,Ua: NEGATIVE
Nitrite: NEGATIVE
Specific Gravity, Urine: 1.02 (ref 1.000–1.030)
Total Protein, Urine: 100 — AB
Urine Glucose: NEGATIVE
Urobilinogen, UA: 0.2 (ref 0.0–1.0)
pH: 5.5 (ref 5.0–8.0)

## 2024-07-24 LAB — LIPID PANEL
Cholesterol: 149 mg/dL (ref 0–200)
HDL: 31.5 mg/dL — ABNORMAL LOW (ref >39.00)
LDL Cholesterol: 87 mg/dL (ref 0–99)
NonHDL: 117.39
Total CHOL/HDL Ratio: 5
Triglycerides: 153 mg/dL — ABNORMAL HIGH (ref 0.0–149.0)
VLDL: 30.6 mg/dL (ref 0.0–40.0)

## 2024-07-24 LAB — CBC WITH DIFFERENTIAL/PLATELET
Basophils Absolute: 0.1 K/uL (ref 0.0–0.1)
Basophils Relative: 0.7 % (ref 0.0–3.0)
Eosinophils Absolute: 0.2 K/uL (ref 0.0–0.7)
Eosinophils Relative: 2.6 % (ref 0.0–5.0)
HCT: 45.1 % (ref 39.0–52.0)
Hemoglobin: 15.3 g/dL (ref 13.0–17.0)
Lymphocytes Relative: 25.5 % (ref 12.0–46.0)
Lymphs Abs: 2.4 K/uL (ref 0.7–4.0)
MCHC: 33.9 g/dL (ref 30.0–36.0)
MCV: 84.7 fl (ref 78.0–100.0)
Monocytes Absolute: 0.6 K/uL (ref 0.1–1.0)
Monocytes Relative: 6.9 % (ref 3.0–12.0)
Neutro Abs: 6 K/uL (ref 1.4–7.7)
Neutrophils Relative %: 64.3 % (ref 43.0–77.0)
Platelets: 179 K/uL (ref 150.0–400.0)
RBC: 5.33 Mil/uL (ref 4.22–5.81)
RDW: 13.8 % (ref 11.5–15.5)
WBC: 9.4 K/uL (ref 4.0–10.5)

## 2024-07-24 LAB — HEPATIC FUNCTION PANEL
ALT: 8 U/L (ref 0–53)
AST: 13 U/L (ref 0–37)
Albumin: 4 g/dL (ref 3.5–5.2)
Alkaline Phosphatase: 73 U/L (ref 39–117)
Bilirubin, Direct: 0.2 mg/dL (ref 0.0–0.3)
Total Bilirubin: 1.3 mg/dL — ABNORMAL HIGH (ref 0.2–1.2)
Total Protein: 7.1 g/dL (ref 6.0–8.3)

## 2024-07-24 LAB — BASIC METABOLIC PANEL WITH GFR
BUN: 27 mg/dL — ABNORMAL HIGH (ref 6–23)
CO2: 24 meq/L (ref 19–32)
Calcium: 8.9 mg/dL (ref 8.4–10.5)
Chloride: 108 meq/L (ref 96–112)
Creatinine, Ser: 1.48 mg/dL (ref 0.40–1.50)
GFR: 41.49 mL/min — ABNORMAL LOW (ref >60.00)
Glucose, Bld: 101 mg/dL — ABNORMAL HIGH (ref 70–99)
Potassium: 4.5 meq/L (ref 3.5–5.1)
Sodium: 142 meq/L (ref 135–145)

## 2024-07-24 LAB — TSH: TSH: 1.67 u[IU]/mL (ref 0.35–5.50)

## 2024-07-24 MED ORDER — AMLODIPINE BESYLATE 10 MG PO TABS: 10.0000 mg | ORAL_TABLET | Freq: Every day | ORAL | 1 refills | Status: AC

## 2024-07-24 NOTE — Progress Notes (Unsigned)
 Subjective:  Patient ID: Joseph Carter, male    DOB: 1932/07/17  Age: 89 y.o. MRN: [id number]  CC: Annual Exam and Hypertension   HPI [employer] presents for a CPX and f/up ----  Discussed the use of AI scribe software for clinical note transcription with the patient, who gave verbal consent to proceed.  History of Present Illness Joseph Carter is a 89 year old male who presents with dizziness and low heart rate.  He has experienced dizziness for years, particularly when looking up quickly or standing up. However, he notes that he has not experienced dizziness in the past two weeks since he stopped taking his medication. During this time, he has been able to work on his car and look up without issues, which he could not do before.  He mentions that his blood pressure usually runs around 162/86, even when he takes his medication. He has not taken his medication for the past two weeks.  He mentions feeling good overall in the last two weeks and has remained active, although his arthritis affects him when he is not moving. No chest pain, shortness of breath, swelling in his legs or feet, irregular heartbeats, headaches, or blurred vision.  He also mentions having a bruise, which he attributes to leaning against something while working on his tractor.  He notes that he has no feeling in one of his fingers, which he attributes to age, as he and his friends, who are all over 37, experience similar symptoms.     Outpatient Medications Prior to Visit  Medication Sig Dispense Refill   tamsulosin  (FLOMAX ) 0.4 MG CAPS capsule TAKE 1 CAPSULE(0.4 MG) BY MOUTH AT BEDTIME 90 capsule 1   amLODipine  (NORVASC ) 10 MG tablet Take 1 tablet (10 mg total) by mouth daily. 90 tablet 1   No facility-administered medications prior to visit.    ROS Review of Systems  Objective:  BP (!) 162/86 (BP Location: Left Arm, Patient Position: Sitting, Cuff Size: Normal)   Pulse (!) 53   Temp 97.6 F  (36.4 C) (Oral)   Ht 6' 3 (1.905 m)   Wt 199 lb (90.3 kg)   SpO2 96%   BMI 24.87 kg/m   BP Readings from Last 3 Encounters:  07/24/24 (!) 162/86  11/16/23 (!) 148/84  01/09/23 138/80    Wt Readings from Last 3 Encounters:  07/24/24 199 lb (90.3 kg)  07/23/24 207 lb (93.9 kg)  11/16/23 207 lb 3.2 oz (94 kg)    Physical Exam Vitals reviewed.  Constitutional:      Appearance: Normal appearance.  HENT:     Mouth/Throat:     Mouth: Mucous membranes are moist.  Eyes:     General: No scleral icterus.    Conjunctiva/sclera: Conjunctivae normal.  Cardiovascular:     Rate and Rhythm: Bradycardia present.     Heart sounds: Murmur heard.     Systolic murmur is present with a grade of 1/6.     No friction rub. No gallop.     Comments: EKG- SB with 1st degree AV block, 51 bpm LAD Inferior/septal infarct patterns are not new No LVH Pulmonary:     Effort: Pulmonary effort is normal.     Breath sounds: No stridor. No wheezing, rhonchi or rales.  Abdominal:     General: Abdomen is flat.     Palpations: There is no mass.     Tenderness: There is no abdominal tenderness. There is no  guarding.     Hernia: No hernia is present.  Musculoskeletal:        General: No swelling. Normal range of motion.     Cervical back: Neck supple.     Right lower leg: No edema.     Left lower leg: No edema.  Lymphadenopathy:     Cervical: No cervical adenopathy.  Skin:    Coloration: Skin is not jaundiced.     Findings: Bruising present. No lesion.  Neurological:     General: No focal deficit present.     Mental Status: He is alert. Mental status is at baseline.  Psychiatric:        Attention and Perception: He is inattentive.        Mood and Affect: Mood normal.        Behavior: Behavior normal.        Cognition and Memory: Cognition is impaired. Memory is impaired.     Lab Results  Component Value Date   WBC 9.4 07/24/2024   HGB 15.3 07/24/2024   HCT 45.1 07/24/2024   PLT 179.0  07/24/2024   GLUCOSE 101 (H) 07/24/2024   CHOL 149 07/24/2024   TRIG 153.0 (H) 07/24/2024   HDL 31.50 (L) 07/24/2024   LDLCALC 87 07/24/2024   ALT 8 07/24/2024   AST 13 07/24/2024   NA 142 07/24/2024   K 4.5 07/24/2024   CL 108 07/24/2024   CREATININE 1.48 07/24/2024   BUN 27 (H) 07/24/2024   CO2 24 07/24/2024   TSH 1.67 07/24/2024   PSA 0.00 (L) 01/09/2023   INR 1.09 02/27/2014   HGBA1C 5.6 11/16/2023    DG Chest 2 View Result Date: 11/03/2016 CLINICAL DATA:  Shortness of breath. EXAM: CHEST  2 VIEW COMPARISON:  03/21/2016. FINDINGS: Mediastinum and hilar structures are normal. Stable cardiomegaly. No focal alveolar infiltrate. No pleural effusion or pneumothorax. Pleural thickening on the right has improved. Old right rib fractures noted. Metallic densities noted over the right chest . IMPRESSION: 1.  Stable cardiomegaly.  No acute pulmonary disease. 2.  Old right rib fractures are again noted. Electronically Signed   By: Debby  Register   On: 11/03/2016 13:29    Assessment & Plan:  Essential hypertension -     CBC with Differential/Platelet; Future -     Basic metabolic panel with GFR; Future -     Urinalysis, Routine w reflex microscopic; Future -     amLODIPine  Besylate; Take 1 tablet (10 mg total) by mouth daily.  Dispense: 90 tablet; Refill: 1  B12 deficiency -     CBC with Differential/Platelet; Future  Kidney stones  Stage 3a chronic kidney disease (HCC) -     Basic metabolic panel with GFR; Future -     Urinalysis, Routine w reflex microscopic; Future  Encounter for general adult medical examination with abnormal findings  Bradycardia with 51-60 beats per minute -     TSH; Future -     Basic metabolic panel with GFR; Future -     EKG 12-Lead -     LONG TERM MONITOR (3-14 DAYS); Future  Dyslipidemia, goal LDL below 100 -     Lipid panel; Future -     TSH; Future -     Hepatic function panel; Future     Follow-up: Return in about 6 months (around  01/24/2025).  Debby Molt, MD

## 2024-07-24 NOTE — Patient Instructions (Signed)
 Health Maintenance, Male  Adopting a healthy lifestyle and getting preventive care are important in promoting health and wellness. Ask your health care provider about:  The right schedule for you to have regular tests and exams.  Things you can do on your own to prevent diseases and keep yourself healthy.  What should I know about diet, weight, and exercise?  Eat a healthy diet    Eat a diet that includes plenty of vegetables, fruits, low-fat dairy products, and lean protein.  Do not eat a lot of foods that are high in solid fats, added sugars, or sodium.  Maintain a healthy weight  Body mass index (BMI) is a measurement that can be used to identify possible weight problems. It estimates body fat based on height and weight. Your health care provider can help determine your BMI and help you achieve or maintain a healthy weight.  Get regular exercise  Get regular exercise. This is one of the most important things you can do for your health. Most adults should:  Exercise for at least 150 minutes each week. The exercise should increase your heart rate and make you sweat (moderate-intensity exercise).  Do strengthening exercises at least twice a week. This is in addition to the moderate-intensity exercise.  Spend less time sitting. Even light physical activity can be beneficial.  Watch cholesterol and blood lipids  Have your blood tested for lipids and cholesterol at 88 years of age, then have this test every 5 years.  You may need to have your cholesterol levels checked more often if:  Your lipid or cholesterol levels are high.  You are older than 88 years of age.  You are at high risk for heart disease.  What should I know about cancer screening?  Many types of cancers can be detected early and may often be prevented. Depending on your health history and family history, you may need to have cancer screening at various ages. This may include screening for:  Colorectal cancer.  Prostate cancer.  Skin cancer.  Lung  cancer.  What should I know about heart disease, diabetes, and high blood pressure?  Blood pressure and heart disease  High blood pressure causes heart disease and increases the risk of stroke. This is more likely to develop in people who have high blood pressure readings or are overweight.  Talk with your health care provider about your target blood pressure readings.  Have your blood pressure checked:  Every 3-5 years if you are 24-52 years of age.  Every year if you are 3 years old or older.  If you are between the ages of 60 and 72 and are a current or former smoker, ask your health care provider if you should have a one-time screening for abdominal aortic aneurysm (AAA).  Diabetes  Have regular diabetes screenings. This checks your fasting blood sugar level. Have the screening done:  Once every three years after age 66 if you are at a normal weight and have a low risk for diabetes.  More often and at a younger age if you are overweight or have a high risk for diabetes.  What should I know about preventing infection?  Hepatitis B  If you have a higher risk for hepatitis B, you should be screened for this virus. Talk with your health care provider to find out if you are at risk for hepatitis B infection.  Hepatitis C  Blood testing is recommended for:  Everyone born from [birth year] through 1963.  Anyone  with known risk factors for hepatitis C.  Sexually transmitted infections (STIs)  You should be screened each year for STIs, including gonorrhea and chlamydia, if:  You are sexually active and are younger than 88 years of age.  You are older than 89 years of age and your health care provider tells you that you are at risk for this type of infection.  Your sexual activity has changed since you were last screened, and you are at increased risk for chlamydia or gonorrhea. Ask your health care provider if you are at risk.  Ask your health care provider about whether you are at high risk for HIV. Your health care provider  may recommend a prescription medicine to help prevent HIV infection. If you choose to take medicine to prevent HIV, you should first get tested for HIV. You should then be tested every 3 months for as long as you are taking the medicine.  Follow these instructions at home:  Alcohol use  Do not drink alcohol if your health care provider tells you not to drink.  If you drink alcohol:  Limit how much you have to 0-2 drinks a day.  Know how much alcohol is in your drink. In the [place] one drink equals one 12 oz bottle of beer (355 mL), one 5 oz glass of wine (148 mL), or one 1 oz glass of hard liquor (44 mL).  Lifestyle  Do not use any products that contain nicotine or tobacco. These products include cigarettes, chewing tobacco, and vaping devices, such as e-cigarettes. If you need help quitting, ask your health care provider.  Do not use street drugs.  Do not share needles.  Ask your health care provider for help if you need support or information about quitting drugs.  General instructions  Schedule regular health, dental, and eye exams.  Stay current with your vaccines.  Tell your health care provider if:  You often feel depressed.  You have ever been abused or do not feel safe at home.  Summary  Adopting a healthy lifestyle and getting preventive care are important in promoting health and wellness.  Follow your health care provider's instructions about healthy diet, exercising, and getting tested or screened for diseases.  Follow your health care provider's instructions on monitoring your cholesterol and blood pressure.  This information is not intended to replace advice given to you by your health care provider. Make sure you discuss any questions you have with your health care provider.  Document Revised: 04/05/2021 Document Reviewed: 04/05/2021  Elsevier Patient Education  2024 [employer].

## 2024-07-24 NOTE — Progress Notes (Unsigned)
 EP to read.

## 2024-07-26 ENCOUNTER — Ambulatory Visit: Payer: Self-pay | Admitting: Internal Medicine

## 2024-08-05 ENCOUNTER — Other Ambulatory Visit: Payer: Self-pay | Admitting: Internal Medicine

## 2024-08-05 DIAGNOSIS — I10 Essential (primary) hypertension: Secondary | ICD-10-CM

## 2024-08-09 ENCOUNTER — Other Ambulatory Visit: Payer: Self-pay | Admitting: Internal Medicine

## 2024-08-09 DIAGNOSIS — I10 Essential (primary) hypertension: Secondary | ICD-10-CM

## 2024-08-19 NOTE — Progress Notes (Signed)
 SABRA

## 2024-08-21 ENCOUNTER — Ambulatory Visit (INDEPENDENT_AMBULATORY_CARE_PROVIDER_SITE_OTHER)

## 2024-08-21 ENCOUNTER — Telehealth: Payer: Self-pay

## 2024-08-21 ENCOUNTER — Other Ambulatory Visit: Payer: Self-pay

## 2024-08-21 DIAGNOSIS — E538 Deficiency of other specified B group vitamins: Secondary | ICD-10-CM | POA: Diagnosis not present

## 2024-08-21 MED ORDER — CYANOCOBALAMIN 1000 MCG/ML IJ SOLN
1000.0000 ug | Freq: Once | INTRAMUSCULAR | Status: AC
  Administered 2024-08-21: 1000 ug via INTRAMUSCULAR

## 2024-08-21 NOTE — Telephone Encounter (Signed)
 LVM for patient. He requested a refill of Irbesartan  during his NV today, looks like Irebesartan was D/C in 10/2023 and patient was started on Amlodipine . If he needs amlodipine  refills, happy to refill that medication for him.

## 2024-08-21 NOTE — Progress Notes (Signed)
 Pt here for monthly B12 injection per   B12 1000mcg given IM and pt tolerated injection well.  Next B12 injection scheduled f

## 2024-09-02 DIAGNOSIS — I10 Essential (primary) hypertension: Secondary | ICD-10-CM | POA: Diagnosis not present

## 2024-09-02 DIAGNOSIS — H6123 Impacted cerumen, bilateral: Secondary | ICD-10-CM | POA: Diagnosis not present

## 2024-09-02 DIAGNOSIS — Z6824 Body mass index (BMI) 24.0-24.9, adult: Secondary | ICD-10-CM | POA: Diagnosis not present

## 2024-09-05 DIAGNOSIS — Z6824 Body mass index (BMI) 24.0-24.9, adult: Secondary | ICD-10-CM | POA: Diagnosis not present

## 2024-09-05 DIAGNOSIS — H6121 Impacted cerumen, right ear: Secondary | ICD-10-CM | POA: Diagnosis not present

## 2024-09-27 ENCOUNTER — Ambulatory Visit (INDEPENDENT_AMBULATORY_CARE_PROVIDER_SITE_OTHER)

## 2024-09-27 DIAGNOSIS — E538 Deficiency of other specified B group vitamins: Secondary | ICD-10-CM

## 2024-09-27 DIAGNOSIS — Z23 Encounter for immunization: Secondary | ICD-10-CM | POA: Diagnosis not present

## 2024-09-27 MED ORDER — CYANOCOBALAMIN 1000 MCG/ML IJ SOLN
1000.0000 ug | Freq: Once | INTRAMUSCULAR | Status: AC
  Administered 2024-09-27: 1000 ug via INTRAMUSCULAR

## 2024-09-27 NOTE — Progress Notes (Signed)
 After obtaining consent, and per orders of Dr. Joshua, injection of B12 and HDFL given by Ronnald SHA[place]. Patient instructed to report any adverse reaction to me immediately.

## 2024-10-30 ENCOUNTER — Other Ambulatory Visit: Payer: Self-pay | Admitting: Internal Medicine

## 2024-10-30 DIAGNOSIS — N401 Enlarged prostate with lower urinary tract symptoms: Secondary | ICD-10-CM

## 2024-11-08 ENCOUNTER — Ambulatory Visit
